# Patient Record
Sex: Male | Born: 1935 | Race: White | Hispanic: No | Marital: Single | Smoking: Never smoker
Health system: Southern US, Community
[De-identification: ages and names within clinical notes are randomized; demographics above are authoritative.]

## PROBLEM LIST (undated history)

## (undated) DIAGNOSIS — I2510 Atherosclerotic heart disease of native coronary artery without angina pectoris: Secondary | ICD-10-CM

## (undated) DIAGNOSIS — I4821 Permanent atrial fibrillation: Secondary | ICD-10-CM

## (undated) DIAGNOSIS — I10 Essential (primary) hypertension: Secondary | ICD-10-CM

## (undated) DIAGNOSIS — I428 Other cardiomyopathies: Secondary | ICD-10-CM

## (undated) DIAGNOSIS — J449 Chronic obstructive pulmonary disease, unspecified: Secondary | ICD-10-CM

## (undated) DIAGNOSIS — E785 Hyperlipidemia, unspecified: Secondary | ICD-10-CM

---

## 2001-01-30 ENCOUNTER — Ambulatory Visit (HOSPITAL_BASED_OUTPATIENT_CLINIC_OR_DEPARTMENT_OTHER): Admission: RE | Admit: 2001-01-30 | Discharge: 2001-01-30 | Payer: Self-pay | Admitting: Orthopedic Surgery

## 2001-02-13 ENCOUNTER — Encounter (INDEPENDENT_AMBULATORY_CARE_PROVIDER_SITE_OTHER): Payer: Self-pay | Admitting: Specialist

## 2001-02-13 ENCOUNTER — Ambulatory Visit (HOSPITAL_BASED_OUTPATIENT_CLINIC_OR_DEPARTMENT_OTHER): Admission: RE | Admit: 2001-02-13 | Discharge: 2001-02-13 | Payer: Self-pay | Admitting: Orthopedic Surgery

## 2002-03-18 ENCOUNTER — Ambulatory Visit (HOSPITAL_COMMUNITY): Admission: RE | Admit: 2002-03-18 | Discharge: 2002-03-18 | Payer: Self-pay | Admitting: Family Medicine

## 2002-03-18 ENCOUNTER — Encounter: Payer: Self-pay | Admitting: Family Medicine

## 2002-03-27 ENCOUNTER — Encounter: Payer: Self-pay | Admitting: Orthopedic Surgery

## 2002-03-27 ENCOUNTER — Ambulatory Visit (HOSPITAL_COMMUNITY): Admission: RE | Admit: 2002-03-27 | Discharge: 2002-03-27 | Payer: Self-pay | Admitting: Orthopedic Surgery

## 2002-09-14 ENCOUNTER — Ambulatory Visit (HOSPITAL_COMMUNITY): Admission: RE | Admit: 2002-09-14 | Discharge: 2002-09-14 | Payer: Self-pay | Admitting: Family Medicine

## 2002-09-14 ENCOUNTER — Encounter: Payer: Self-pay | Admitting: Family Medicine

## 2002-11-15 ENCOUNTER — Observation Stay (HOSPITAL_COMMUNITY): Admission: RE | Admit: 2002-11-15 | Discharge: 2002-11-16 | Payer: Self-pay | Admitting: General Surgery

## 2002-11-15 ENCOUNTER — Encounter (INDEPENDENT_AMBULATORY_CARE_PROVIDER_SITE_OTHER): Payer: Self-pay | Admitting: Specialist

## 2002-11-15 ENCOUNTER — Encounter: Payer: Self-pay | Admitting: General Surgery

## 2005-02-15 ENCOUNTER — Emergency Department (HOSPITAL_COMMUNITY): Admission: EM | Admit: 2005-02-15 | Discharge: 2005-02-15 | Payer: Self-pay | Admitting: Emergency Medicine

## 2009-07-25 ENCOUNTER — Inpatient Hospital Stay (HOSPITAL_COMMUNITY): Admission: EM | Admit: 2009-07-25 | Discharge: 2009-07-29 | Payer: Self-pay

## 2009-07-25 ENCOUNTER — Ambulatory Visit: Payer: Self-pay | Admitting: Diagnostic Radiology

## 2009-07-25 ENCOUNTER — Encounter: Payer: Self-pay | Admitting: Emergency Medicine

## 2009-07-26 ENCOUNTER — Ambulatory Visit: Payer: Self-pay | Admitting: Pulmonary Disease

## 2009-08-02 DIAGNOSIS — I4891 Unspecified atrial fibrillation: Secondary | ICD-10-CM | POA: Insufficient documentation

## 2009-08-02 DIAGNOSIS — J13 Pneumonia due to Streptococcus pneumoniae: Secondary | ICD-10-CM | POA: Insufficient documentation

## 2009-08-02 DIAGNOSIS — K7689 Other specified diseases of liver: Secondary | ICD-10-CM | POA: Insufficient documentation

## 2009-08-02 DIAGNOSIS — Z87898 Personal history of other specified conditions: Secondary | ICD-10-CM | POA: Insufficient documentation

## 2009-08-02 DIAGNOSIS — J8410 Pulmonary fibrosis, unspecified: Secondary | ICD-10-CM

## 2009-08-03 ENCOUNTER — Ambulatory Visit: Payer: Self-pay | Admitting: Diagnostic Radiology

## 2009-08-03 ENCOUNTER — Ambulatory Visit (HOSPITAL_BASED_OUTPATIENT_CLINIC_OR_DEPARTMENT_OTHER): Admission: RE | Admit: 2009-08-03 | Discharge: 2009-08-03 | Payer: Self-pay | Admitting: Pulmonary Disease

## 2009-08-03 ENCOUNTER — Ambulatory Visit: Payer: Self-pay | Admitting: Pulmonary Disease

## 2009-08-07 ENCOUNTER — Telehealth: Payer: Self-pay | Admitting: Pulmonary Disease

## 2009-08-24 ENCOUNTER — Ambulatory Visit: Payer: Self-pay | Admitting: Pulmonary Disease

## 2009-08-24 ENCOUNTER — Telehealth: Payer: Self-pay | Admitting: Pulmonary Disease

## 2009-09-21 ENCOUNTER — Ambulatory Visit: Payer: Self-pay | Admitting: Pulmonary Disease

## 2009-09-21 ENCOUNTER — Ambulatory Visit (HOSPITAL_BASED_OUTPATIENT_CLINIC_OR_DEPARTMENT_OTHER): Admission: RE | Admit: 2009-09-21 | Discharge: 2009-09-21 | Payer: Self-pay | Admitting: Pulmonary Disease

## 2009-09-21 ENCOUNTER — Ambulatory Visit: Payer: Self-pay | Admitting: Diagnostic Radiology

## 2009-10-20 ENCOUNTER — Telehealth: Payer: Self-pay | Admitting: Adult Health

## 2009-12-05 ENCOUNTER — Inpatient Hospital Stay (HOSPITAL_COMMUNITY): Admission: EM | Admit: 2009-12-05 | Discharge: 2009-12-07 | Payer: Self-pay | Admitting: Emergency Medicine

## 2009-12-05 ENCOUNTER — Ambulatory Visit: Payer: Self-pay | Admitting: Cardiology

## 2009-12-06 ENCOUNTER — Ambulatory Visit: Payer: Self-pay | Admitting: Psychiatry

## 2009-12-07 ENCOUNTER — Ambulatory Visit: Payer: Self-pay | Admitting: Psychiatry

## 2009-12-07 ENCOUNTER — Inpatient Hospital Stay (HOSPITAL_COMMUNITY): Admission: AD | Admit: 2009-12-07 | Discharge: 2009-12-11 | Payer: Self-pay | Admitting: Psychiatry

## 2009-12-08 ENCOUNTER — Encounter: Payer: Self-pay | Admitting: Emergency Medicine

## 2010-02-22 ENCOUNTER — Ambulatory Visit (HOSPITAL_COMMUNITY): Admission: RE | Admit: 2010-02-22 | Discharge: 2010-02-22 | Payer: Self-pay | Admitting: Orthopedic Surgery

## 2010-04-05 ENCOUNTER — Ambulatory Visit (HOSPITAL_BASED_OUTPATIENT_CLINIC_OR_DEPARTMENT_OTHER): Admission: RE | Admit: 2010-04-05 | Discharge: 2010-04-06 | Payer: Self-pay | Admitting: Orthopedic Surgery

## 2010-04-19 ENCOUNTER — Encounter: Payer: Self-pay | Admitting: Emergency Medicine

## 2010-04-19 ENCOUNTER — Ambulatory Visit: Payer: Self-pay | Admitting: Diagnostic Radiology

## 2010-04-19 ENCOUNTER — Inpatient Hospital Stay (HOSPITAL_COMMUNITY)
Admission: EM | Admit: 2010-04-19 | Discharge: 2010-04-21 | Payer: Self-pay | Source: Home / Self Care | Admitting: Internal Medicine

## 2010-05-03 ENCOUNTER — Inpatient Hospital Stay (HOSPITAL_COMMUNITY): Admission: EM | Admit: 2010-05-03 | Discharge: 2009-09-30 | Payer: Self-pay | Admitting: Emergency Medicine

## 2010-05-03 ENCOUNTER — Emergency Department (HOSPITAL_BASED_OUTPATIENT_CLINIC_OR_DEPARTMENT_OTHER)
Admission: EM | Admit: 2010-05-03 | Discharge: 2010-05-03 | Payer: Self-pay | Source: Home / Self Care | Admitting: Emergency Medicine

## 2010-06-26 NOTE — Assessment & Plan Note (Signed)
Summary: 1 month follow up in [place], with cxr//jwr   Primary Provider/Referring Provider:  Dr. Kirke Corin [facility] [place] Office) [phone]  CC:  Pt here with follow up with cxr.  History of Present Illness: 73/M, ex smoker for FU of steroid responsive ILD. admitted Feb 2011 for pneumonia, hypoxia & ILD on imaging.  CT  chest with contrast showed diffuse reticular opacities with some mosaic ground-glass opacities and tiny ill-defined centrilobular nodules and  mild mediastinal lymphadenopathy, borderline fatty liver, and 1.7-cm  left adrenal adenoma.  EKG showed atrial fibrillation rate control at 91  per minute. The appearance was not typical of usual interstitial pneumonitis/idiopathic pulmonary fibrosis.  I did not see any  honeycombing.  The ground-glass opacities reflected an ongoing inflammatory process.  ESR 85, ANa neg, RA 23, ANCA ([facility]), ALT/AST 154/77 (denies drinking) Echo nml LV fn, nml RVSP Treated with avelox & empiric steroids  August 03, 2009 2:53 PM  coumadin check with dr Izola Price - INR low On pred 40 mg - high sugars required 1/2 tab glipizide on dc Dramatic improvement with steroids, can ambulate off O2, cough 90% better CXR improved BL infx  August 24, 2009 2:08 PM  Down to 20 mg , wired, talks 'too much' bruises easy, remains on sugar pill, has not checked sugars, has changed PMD, sold off all the dogs   September 21, 2009 2:23 PM  down to 10 mg , CXR nml, pressure of speech - will not stop talking, no dyspnea, cough No hallucinations, denies tremors, oriented x 3   Current Medications (verified): 1)  Aspirin 325 Mg Tabs (Aspirin) .Marland Kitchen.. 1 By Mouth Daily 2)  Coumadin 5 Mg Tabs (Warfarin Sodium) .... As Directed 3)  Cardizem Cd 240 Mg Xr24h-Cap (Diltiazem Hcl Coated Beads) .Marland Kitchen.. 1 By Mouth Daily 4)  Flomax 0.4 Mg Caps (Tamsulosin Hcl) .Marland Kitchen.. 1 By Mouth Daily 5)  Lipitor 80 Mg Tabs (Atorvastatin Calcium) .... Take 1/2 Tablet By Mouth  Once A Day 6)  Proscar 5 Mg Tabs (Finasteride) .Marland Kitchen.. 1 By Mouth Daily 7)  Multivitamins   Tabs (Multiple Vitamin) .... Take 1 Tablet By Mouth Once A Day 8)  Cinnamon 500 Mg Caps (Cinnamon) .... Take 2 Capsule By Mouth Once A Day 9)  B-12 .... Take 1 Tablet By Mouth Once A Day 10)  Prednisone 5 Mg Tabs (Prednisone) .... Take 3 Tabs Once Daily X 2 Weeks Then 2 Tabs Once Daily With Food 11)  Zolpidem Tartrate 10 Mg Tabs (Zolpidem Tartrate) .... Take 1 Tab By Mouth At Bedtime As Needed  Allergies (verified): 1)  ! Lisinopril 2)  ! * Antihistamines  Past History:  Past Medical History: Last updated: 08/02/2009 Current Problems:  PNEUMONIA, COMMUNITY ACQUIRED, PNEUMOCOCCAL (ICD-481) INTERSTITIAL LUNG DISEASE (ICD-515) FATTY LIVER DISEASE (ICD-571.8) BENIGN PROSTATIC HYPERTROPHY, HX OF (ICD-V13.8) Hx of ATRIAL FIBRILLATION (ICD-427.31)  Social History: Last updated: 08/03/2009 Lives by himself Has 8 Beagles Estranged daughter Patient states former smoker.   Review of Systems  The patient denies anorexia, fever, weight loss, weight gain, vision loss, decreased hearing, hoarseness, chest pain, syncope, dyspnea on exertion, peripheral edema, prolonged cough, headaches, hemoptysis, abdominal pain, melena, hematochezia, severe indigestion/heartburn, hematuria, muscle weakness, suspicious skin lesions, difficulty walking, depression, unusual weight change, and abnormal bleeding.    Vital Signs:  Patient profile:   75 year old male Height:      75 inches Weight:      178 pounds O2 Sat:  97 % on Room air Temp:     99 degrees F oral Pulse rate:   136 / minute BP sitting:   118 / 84  (left arm) Cuff size:   regular  Vitals Entered By: Zackery Barefoot CMA (September 21, 2009 2:07 PM)  O2 Flow:  Room air CC: Pt here with follow up with cxr Comments Medications reviewed with patient Verified contact number and pharmacy with patient Zackery Barefoot CMA  September 21, 2009 2:07 PM     Physical Exam  Additional Exam:  Gen. Pleasant, well-nourished, in no distress, normal affect ENT - no lesions, no post nasal drip Neck: No JVD, no thyromegaly, no carotid bruits Lungs: no use of accessory muscles, no dullness to percussion, fine biabasal rales, no rhonchi  Cardiovascular: Rhythm regular, heart sounds  normal, no murmurs or gallops, no peripheral edema Musculoskeletal: No deformities, no cyanosis or clubbing     CXR  Procedure date:  09/21/2009  Findings:      Comparison: 08/03/2009   Findings: Mild pulmonary interstitial prominence is again seen bilaterally and has not significantly changed.  This is suspicious for underlying chronic lung disease.  No evidence of pulmonary consolidation or pleural effusion.  Heart size and mediastinal contours are within normal limits.   IMPRESSION: Stable exam.  Probable mild chronic interstitial lung disease.  No acute findings.  Impression & Recommendations:  Problem # 1:  INTERSTITIAL LUNG DISEASE (ICD-515) -steroid responsive , hence favor hypersensitivity pneumonitis rather than UIP. Will taper prednisone rapidly to off given side effects on speech & behaviour. He does not appear to be overtly manic or psychotic .  Medications Added to Medication List This Visit: 1)  Lipitor 80 Mg Tabs (Atorvastatin calcium) .... Take 1/2 tablet by mouth once a day 2)  Cinnamon 500 Mg Caps (Cinnamon) .... Take 2 capsule by mouth once a day 3)  Zolpidem Tartrate 10 Mg Tabs (Zolpidem tartrate) .... Take 1 tab by mouth at bedtime as needed  Other Orders: Est. Patient Level III (16109)  Patient Instructions: 1)  Copy sent to: dR mOORE 2)  Please schedule a follow-up appointment in 4 weeks with tp 3)  Decrease prednisone 5 mg once daily x 1 week , then 5 mg every other day till you finish

## 2010-06-26 NOTE — Miscellaneous (Signed)
Summary: Orders Update  Clinical Lists Changes  Orders: Added new Test order of T-2 View CXR (71020TC) - Signed 

## 2010-06-26 NOTE — Progress Notes (Signed)
  Phone Note Outgoing Call   Summary of Call: Called [facility] for ANCA results. Pending fax of results. Zackery Barefoot CMA  August 07, 2009 3:36 PM

## 2010-06-26 NOTE — Consult Note (Signed)
Summary: [facility]  [facility]   Imported By: Marylou Mccoy 12/19/2009 09:30:54  _____________________________________________________________________  External Attachment:    Type:   Image     Comment:   External Document

## 2010-06-26 NOTE — Assessment & Plan Note (Signed)
Summary: 2 week follow up in [place]-jwr   Visit Type:  Follow-up Primary Provider/Referring Provider:  Dr. Kirke Corin [facility], [place] Office) [phone]  CC:  Pt here for follow up. Pt request to d/c Prednisone due to side effects.  Pt states ran out of Prednisone yesterday. Pt states breathing is better w/o S.O.B.  History of Present Illness: 73/M, ex smoker for FU of steroid responsive ILD. admitted Feb 2011 for pneumonia, hypoxia & ILD on imaging.  CT  chest with contrast showed diffuse reticular opacities with some mosaic ground-glass opacities and tiny ill-defined centrilobular nodules and  mild mediastinal lymphadenopathy, borderline fatty liver, and 1.7-cm  left adrenal adenoma.  EKG showed atrial fibrillation rate control at 91  per minute. The appearance was not typical of usual interstitial pneumonitis/idiopathic pulmonary fibrosis.  I did not see any  honeycombing.  The ground-glass opacities reflected an ongoing inflammatory process.  ESR 85, ANa neg, RA 23, ANCA ([facility]), ALT/AST 154/77 (denies drinking) Echo nml LV fn, nml RVSP Treated with avelox & empiric steroids  August 03, 2009 2:53 PM  coumadin check with dr Izola Price - INR low On pred 40 mg - high sugars required 1/2 tab glipizide on dc Dramatic improvement with steroids, can ambulate off O2, cough 90% better CXR improved BL infx  August 24, 2009 2:08 PM  Down to 20 mg , wired, talks 'too much' bruises easy, remains on sugar pill, has not checked sugars, has changed PMD, sold off all the dogs    Allergies (verified): 1)  ! Lisinopril 2)  ! * Antihistamines  Past History:  Past Medical History: Last updated: 08/02/2009 Current Problems:  PNEUMONIA, COMMUNITY ACQUIRED, PNEUMOCOCCAL (ICD-481) INTERSTITIAL LUNG DISEASE (ICD-515) FATTY LIVER DISEASE (ICD-571.8) BENIGN PROSTATIC HYPERTROPHY, HX OF (ICD-V13.8) Hx of ATRIAL FIBRILLATION (ICD-427.31)  Social History: Last updated:  08/03/2009 Lives by himself Has 8 Beagles Estranged daughter Patient states former smoker.   Risk Factors: Smoking Status: quit (08/03/2009) Packs/Day: 0.25 (08/03/2009)  Review of Systems  The patient denies anorexia, fever, weight loss, weight gain, vision loss, decreased hearing, hoarseness, chest pain, syncope, dyspnea on exertion, peripheral edema, prolonged cough, headaches, hemoptysis, abdominal pain, melena, hematochezia, severe indigestion/heartburn, hematuria, muscle weakness, suspicious skin lesions, difficulty walking, depression, unusual weight change, and abnormal bleeding.    Vital Signs:  Patient profile:   75 year old male Height:      75 inches Weight:      180 pounds O2 Sat:      96 % on Room air Temp:     98.0 degrees F oral Pulse rate:   75 / minute BP sitting:   90 / 60  (left arm) Cuff size:   regular  Vitals Entered By: Zackery Barefoot CMA (August 24, 2009 1:48 PM)  O2 Flow:  Room air  Serial Vital Signs/Assessments:  Comments: Ambulatory Pulse Oximetry  Resting; HR__65___    02 Sat_____  Lap1 (185 feet)   HR__125___   02 Sat__99%RA___ Lap2 (185 feet)   HR__120___   02 Sat__98%RA___    Lap3 (185 feet)   HR__147___   02 Sat__99%RA___  _x__Test Completed without Difficulty ___Test Stopped due to:  Zackery Barefoot CMA  August 24, 2009 4:06 PM    By: Zackery Barefoot CMA   CC: Pt here for follow up. Pt request to d/c Prednisone due to side effects.  Pt states ran out of Prednisone yesterday. Pt states breathing is better w/o S.O.B Comments Medications reviewed with patient Verified contact  number and pharmacy with patient Zackery Barefoot CMA  August 24, 2009 1:55 PM    Physical Exam  Additional Exam:  Gen. Pleasant, well-nourished, in no distress, normal affect ENT - no lesions, no post nasal drip Neck: No JVD, no thyromegaly, no carotid bruits Lungs: no use of accessory muscles, no dullness to percussion, fine biabasal rales, no rhonchi   Cardiovascular: Rhythm regular, heart sounds  normal, no murmurs or gallops, no peripheral edema Musculoskeletal: No deformities, no cyanosis or clubbing     Medications Added to Medication List This Visit: 1)  Atenolol 50 Mg Tabs (Atenolol) .... Take 1/2 tablet by mouth two times a day 2)  Prednisone 5 Mg Tabs (Prednisone) .... Take 3 tabs once daily x 2 weeks then 2 tabs once daily with food  Other Orders: Est. Patient Level III (73419) Prescription Created Electronically [phone])  Patient Instructions: 1)  Copy sent to: Dr Christell Constant 2)  Please schedule a follow-up appointment in 1 month. 3)  Take 15 mg prednisone (3 x 5 mg tabs ) once daily  4)  On Apil 15 th, cut down to 2 tabs (10 mg ) once daily  5)  Ambulate -O2 satn Prescriptions: PREDNISONE 5 MG TABS (PREDNISONE) Take 3 tabs once daily x 2 weeks then 2 tabs once daily with food  #75 x 1   Entered and Authorized by:   Comer Locket Vassie Loll MD   Signed by:   Comer Locket Vassie Loll MD on 08/24/2009   Method used:   Print then Give to Patient   RxID:   [id number]    Immunization History:  Influenza Immunization History:    Influenza:  historical (02/27/2009)  Pneumovax Immunization History:    Pneumovax:  historical (06/01/2007)  Zostavax History:    Zostavax # 1:  zostavax (05/29/2009)

## 2010-06-26 NOTE — Progress Notes (Signed)
Summary: nos appt  Phone Note Call from Patient   Caller: [email or web address]  Call For: [name] Summary of Call: ATC pt to rsc nos from 5/26, pt states he is doing great and no longer needs to be seen will call if anything changes. Initial call taken by: Darletta Moll,  Oct 20, 2009 9:06 AM

## 2010-06-26 NOTE — Assessment & Plan Note (Signed)
Summary: HFU/ PNA PER NURSE-MARY-PER RA //kp   Visit Type:  Hospital Follow-up Primary Provider/Referring Provider:  Dr. Izola Price  CC:  Pt here for hospital follow up. Pt here for first time in office. Pt states is doing better everyday since leaving hospital. Pt states finished antibiotic and rescue inhaler.  History of Present Illness: 73/M, ex smoker admitted Feb 2011 for pneumonia, hypoxia & ILD on imaging.  CT  chest with contrast showed diffuse reticular opacities with some mosaic ground-glass opacities and tiny ill-defined centrilobular nodules and  mild mediastinal lymphadenopathy, borderline fatty liver, and 1.7-cm  left adrenal adenoma.  EKG showed atrial fibrillation rate control at 91  per minute. The appearance was not typical of usual interstitial pneumonitis/idiopathic pulmonary fibrosis.  I did not see any  honeycombing.  The ground-glass opacities reflected an ongoing inflammatory process.  ESR 85, ANa neg, RA 23, ANCA ([facility]), ALT/AST 154/77 (denies drinking) Echo nml LV fn, nml RVSP Treated with avelox & empiric steroids  August 03, 2009 2:53 PM  coumadin check with dr Izola Price - INR low On pred 40 mg - high sugars required 1/2 tab glipizide on dc Dramatic improvement with steroids, can ambulate off O2, cough 90% better CXR improved BL infx   Preventive Screening-Counseling & Management  Alcohol-Tobacco     Smoking Status: quit     Packs/Day: 0.25     Year Started: 1956     Year Quit: 1970  Current Medications (verified): 1)  Prednisone 10 Mg Tabs (Prednisone) .... Taper As Directed 2)  Aspirin 325 Mg Tabs (Aspirin) .Marland Kitchen.. 1 By Mouth Daily 3)  Atenolol 100 Mg Tabs (Atenolol) .... 1/2 By Mouth Daily 4)  Coumadin 5 Mg Tabs (Warfarin Sodium) .... As Directed 5)  Cardizem Cd 240 Mg Xr24h-Cap (Diltiazem Hcl Coated Beads) .Marland Kitchen.. 1 By Mouth Daily 6)  Flomax 0.4 Mg Caps (Tamsulosin Hcl) .Marland Kitchen.. 1 By Mouth Daily 7)  Lipitor 40 Mg Tabs (Atorvastatin Calcium) .Marland Kitchen.. 1 By Mouth  Daily 8)  Proscar 5 Mg Tabs (Finasteride) .Marland Kitchen.. 1 By Mouth Daily 9)  Multivitamins   Tabs (Multiple Vitamin) .... Take 1 Tablet By Mouth Once A Day 10)  Cinnamon 500 Mg Caps (Cinnamon) .... Take 1 Capsule By Mouth Once A Day 11)  B-12 .... Take 1 Tablet By Mouth Once A Day  Allergies (verified): 1)  ! Lisinopril 2)  ! * Antihistamines  Past History:  Past Medical History: Last updated: 08/02/2009 Current Problems:  PNEUMONIA, COMMUNITY ACQUIRED, PNEUMOCOCCAL (ICD-481) INTERSTITIAL LUNG DISEASE (ICD-515) FATTY LIVER DISEASE (ICD-571.8) BENIGN PROSTATIC HYPERTROPHY, HX OF (ICD-V13.8) Hx of ATRIAL FIBRILLATION (ICD-427.31)  Past Surgical History: Cholecystectomy Right knee repair Left shoulder rotator cuff repair Bilateral hand surgery Tonsillectomy  Family History: Family History C V A / Stroke-mother, sister, brother Pacemaker-father  Social History: Reviewed history from 08/02/2009 and no changes required. Lives by himself Has 8 Beagles Estranged daughter Patient states former smoker.  Packs/Day:  0.25  Review of Systems       The patient complains of non-productive cough, irregular heartbeats, and weight change.  The patient denies shortness of breath with activity, shortness of breath at rest, productive cough, coughing up blood, chest pain, acid heartburn, indigestion, loss of appetite, abdominal pain, difficulty swallowing, sore throat, tooth/dental problems, headaches, nasal congestion/difficulty breathing through nose, sneezing, itching, ear ache, anxiety, depression, hand/feet swelling, joint stiffness or pain, rash, change in color of mucus, and fever.    Vital Signs:  Patient profile:   75 year old male Height:  75 inches Weight:      182 pounds BMI:     22.83 O2 Sat:      94 % on Room air Temp:     97.6 degrees F oral Pulse rate:   85 / minute BP sitting:   124 / 78  (left arm) Cuff size:   regular  Vitals Entered By: Zackery Barefoot CMA (August 03, 2009 2:31 PM)  O2 Flow:  Room air CC: Pt here for hospital follow up. Pt here for first time in office. Pt states is doing better everyday since leaving hospital. Pt states finished antibiotic and rescue inhaler Comments Medications reviewed with patient Verified contact number and pharmacy with patient Zackery Barefoot CMA  August 03, 2009 2:32 PM    Physical Exam  Additional Exam:  Gen. Pleasant, well-nourished, in no distress, normal affect ENT - no lesions, no post nasal drip Neck: No JVD, no thyromegaly, no carotid bruits Lungs: no use of accessory muscles, no dullness to percussion, fine biabasal rales, no rhonchi  Cardiovascular: Rhythm regular, heart sounds  normal, no murmurs or gallops, no peripheral edema Abdomen: soft and non-tender, no hepatosplenomegaly, BS normal. Musculoskeletal: No deformities, no cyanosis or clubbing Neuro:  alert, non focal     CXR  Procedure date:  08/03/2009  Findings:      Comparison: 07/27/2009   Findings: Heart is mildly enlarged. Mediastinal contours and pulmonary vascularity normal. Bilateral diffuse interstitial infiltrates have improved since previous exam. No segmental consolidation, pleural effusion or pneumothorax. Scattered end plate spur formation thoracic spine. Calcification at the right coracoclavicular ligaments.   IMPRESSION: Improving bilateral interstitial infiltrates.  Impression & Recommendations:  Problem # 1:  PNEUMONIA, COMMUNITY ACQUIRED, PNEUMOCOCCAL (ICD-481) No organism isolated, but responded to avelox Orders: Est. Patient Level IV (99214) T-2 View CXR (71020TC)  Problem # 2:  INTERSTITIAL LUNG DISEASE (ICD-515) Feel he has steroid responsive ILD - given dramatic improvement , can defer biopsy for now. Dd includes hypersensitivity pneumonitis. Will taper prednisone by 10 mg q week to 20 mg then slowly to off & see if disease recurs. Expect suagrs to improve as prednisone decreased & hopefully he  can come off glipizide  Problem # 3:  Hx of ATRIAL FIBRILLATION (ICD-427.31) ok to be back on coumadin His BP was low today & he will hold atenolol until improved, ct cardizem His updated medication list for this problem includes:    Aspirin 325 Mg Tabs (Aspirin) .Marland Kitchen... 1 by mouth daily    Atenolol 100 Mg Tabs (Atenolol) .Marland Kitchen... 1/2 by mouth daily    Coumadin 5 Mg Tabs (Warfarin sodium) .Marland Kitchen... As directed  Medications Added to Medication List This Visit: 1)  Atenolol 100 Mg Tabs (Atenolol) .... 1/2 by mouth daily 2)  Multivitamins Tabs (Multiple vitamin) .... Take 1 tablet by mouth once a day 3)  Cinnamon 500 Mg Caps (Cinnamon) .... Take 1 capsule by mouth once a day 4)  B-12  .... Take 1 tablet by mouth once a day  Patient Instructions: 1)  Copy sent to:Dr Izola Price 2)  Please schedule a follow-up appointment in 2 weeks. 3)  A chest x-ray has been recommended.  Your imaging study may require preauthorization.  4)  Please ask Dr Izola Price to check your coumadin & sugars 5)  Cut down on prednisone to  6)  30 mg this saturday 7)  20 mg next saturday 8)  STAY on 20 mg until you see me again

## 2010-06-26 NOTE — Progress Notes (Signed)
Summary: rx question  Phone Note Call from Patient Call back at Home Phone [phone]   Caller: Patient Call For: alva Summary of Call: pt was just seen wants to talk to the nurse about rx Initial call taken by: Lacinda Axon,  August 24, 2009 4:10 PM  Follow-up for Phone Call        Pt was concerned RA did not reduce his Prednisone. Pt was previously on Prednisone 10mg  2 daily. Pt is now on Prednisone 5mg . Pt verbalized understanding.  Follow-up by: Zackery Barefoot CMA,  August 24, 2009 4:47 PM

## 2010-08-07 LAB — CBC
HCT: 37.5 % — ABNORMAL LOW (ref 39.0–52.0)
Hemoglobin: 12.7 g/dL — ABNORMAL LOW (ref 13.0–17.0)
Hemoglobin: 14.8 g/dL (ref 13.0–17.0)
MCH: 32.4 pg (ref 26.0–34.0)
MCHC: 33.5 g/dL (ref 30.0–36.0)
MCHC: 34.6 g/dL (ref 30.0–36.0)
MCV: 93.9 fL (ref 78.0–100.0)
Platelets: 281 10*3/uL (ref 150–400)
RBC: 4.24 MIL/uL (ref 4.22–5.81)
RBC: 4.68 MIL/uL (ref 4.22–5.81)
RDW: 13.8 % (ref 11.5–15.5)
RDW: 14.3 % (ref 11.5–15.5)
WBC: 14.6 10*3/uL — ABNORMAL HIGH (ref 4.0–10.5)
WBC: 13.6 10*3/uL — ABNORMAL HIGH (ref 4.0–10.5)

## 2010-08-07 LAB — DIGOXIN LEVEL
Digoxin Level: 0.6 ng/mL — ABNORMAL LOW (ref 0.8–2.0)
Digoxin Level: 0.4 ng/mL — ABNORMAL LOW (ref 0.8–2.0)

## 2010-08-07 LAB — URINE MICROSCOPIC-ADD ON

## 2010-08-07 LAB — POCT CARDIAC MARKERS
CKMB, poc: 1.1 ng/mL (ref 1.0–8.0)
Myoglobin, poc: 79.4 ng/mL (ref 12–200)

## 2010-08-07 LAB — URINE CULTURE
Colony Count: NO GROWTH
Culture: NO GROWTH

## 2010-08-07 LAB — BASIC METABOLIC PANEL
BUN: 12 mg/dL (ref 6–23)
BUN: 11 mg/dL (ref 6–23)
BUN: 9 mg/dL (ref 6–23)
CO2: 25 mEq/L (ref 19–32)
CO2: 27 mEq/L (ref 19–32)
Calcium: 9.3 mg/dL (ref 8.4–10.5)
Calcium: 8.6 mg/dL (ref 8.4–10.5)
Calcium: 9.5 mg/dL (ref 8.4–10.5)
Chloride: 97 mEq/L (ref 96–112)
Chloride: 104 mEq/L (ref 96–112)
Chloride: 102 mEq/L (ref 96–112)
Creatinine, Ser: 1 mg/dL (ref 0.4–1.5)
GFR calc Af Amer: >60 mL/min (ref >60)
GFR calc Af Amer: >60 mL/min (ref >60)
GFR calc Af Amer: >60 mL/min (ref >60)
GFR calc non Af Amer: >60 mL/min (ref >60)
GFR calc non Af Amer: >60 mL/min (ref >60)
GFR calc non Af Amer: 60 mL/min — ABNORMAL LOW (ref >60)
Glucose, Bld: 158 mg/dL — ABNORMAL HIGH (ref 70–99)
Glucose, Bld: 111 mg/dL — ABNORMAL HIGH (ref 70–99)
Glucose, Bld: 110 mg/dL — ABNORMAL HIGH (ref 70–99)
Glucose, Bld: 93 mg/dL (ref 70–99)
Potassium: 4.3 mEq/L (ref 3.5–5.1)
Sodium: 142 mEq/L (ref 135–145)
Sodium: 142 mEq/L (ref 135–145)

## 2010-08-07 LAB — URINALYSIS, ROUTINE W REFLEX MICROSCOPIC
Bilirubin Urine: NEGATIVE
Bilirubin Urine: NEGATIVE
Glucose, UA: NEGATIVE mg/dL
Glucose, UA: NEGATIVE mg/dL
Ketones, ur: NEGATIVE mg/dL
Ketones, ur: 15 mg/dL — AB
Nitrite: NEGATIVE
Nitrite: NEGATIVE
Protein, ur: NEGATIVE mg/dL
Protein, ur: NEGATIVE mg/dL
Specific Gravity, Urine: 1.022 (ref 1.005–1.030)
pH: 5.5 (ref 5.0–8.0)

## 2010-08-07 LAB — DIFFERENTIAL
Basophils Absolute: 0 10*3/uL (ref 0.0–0.1)
Basophils Relative: 0 % (ref 0–1)
Eosinophils Relative: 1 % (ref 0–5)
Lymphs Abs: 0.7 10*3/uL (ref 0.7–4.0)
Lymphs Abs: 1.3 10*3/uL (ref 0.7–4.0)
Monocytes Relative: 16 % — ABNORMAL HIGH (ref 3–12)
Monocytes Relative: 12 % (ref 3–12)
Neutro Abs: 11 10*3/uL — ABNORMAL HIGH (ref 1.7–7.7)
Neutro Abs: 9.9 10*3/uL — ABNORMAL HIGH (ref 1.7–7.7)
Neutrophils Relative %: 79 % — ABNORMAL HIGH (ref 43–77)
Neutrophils Relative %: 73 % (ref 43–77)

## 2010-08-07 LAB — APTT: aPTT: 47 seconds — ABNORMAL HIGH (ref 24–37)

## 2010-08-07 LAB — POCT TOXICOLOGY PANEL: Opiates: POSITIVE

## 2010-08-11 LAB — URINALYSIS, ROUTINE W REFLEX MICROSCOPIC
Hgb urine dipstick: NEGATIVE
Nitrite: NEGATIVE
Specific Gravity, Urine: 1.015 (ref 1.005–1.030)
Urobilinogen, UA: 1 mg/dL (ref 0.0–1.0)

## 2010-08-11 LAB — COMPREHENSIVE METABOLIC PANEL
BUN: 13 mg/dL (ref 6–23)
CO2: 26 mEq/L (ref 19–32)
Calcium: 9.3 mg/dL (ref 8.4–10.5)
Creatinine, Ser: 1.01 mg/dL (ref 0.4–1.5)
GFR calc non Af Amer: >60 mL/min (ref >60)
Glucose, Bld: 125 mg/dL — ABNORMAL HIGH (ref 70–99)
Total Protein: 7.2 g/dL (ref 6.0–8.3)

## 2010-08-11 LAB — POCT CARDIAC MARKERS
Myoglobin, poc: 77.1 ng/mL (ref 12–200)
Troponin i, poc: <0.05 ng/mL (ref 0.00–0.09)

## 2010-08-11 LAB — DIFFERENTIAL
Eosinophils Absolute: 0.1 10*3/uL (ref 0.0–0.7)
Lymphs Abs: 1 10*3/uL (ref 0.7–4.0)
Monocytes Relative: 8 % (ref 3–12)
Neutro Abs: 5.7 10*3/uL (ref 1.7–7.7)
Neutrophils Relative %: 77 % (ref 43–77)

## 2010-08-11 LAB — CBC
HCT: 45.9 % (ref 39.0–52.0)
Hemoglobin: 15.5 g/dL (ref 13.0–17.0)
MCH: 31.4 pg (ref 26.0–34.0)
MCHC: 33.7 g/dL (ref 30.0–36.0)
MCV: 93.3 fL (ref 78.0–100.0)
RDW: 16.4 % — ABNORMAL HIGH (ref 11.5–15.5)

## 2010-08-11 LAB — URINE CULTURE

## 2010-08-11 LAB — DIGOXIN LEVEL: Digoxin Level: 0.6 ng/mL — ABNORMAL LOW (ref 0.8–2.0)

## 2010-08-11 LAB — PROTIME-INR: Prothrombin Time: 14.9 seconds (ref 11.6–15.2)

## 2010-08-11 LAB — GLUCOSE, CAPILLARY
Glucose-Capillary: 101 mg/dL — ABNORMAL HIGH (ref 70–99)
Glucose-Capillary: 112 mg/dL — ABNORMAL HIGH (ref 70–99)
Glucose-Capillary: 125 mg/dL — ABNORMAL HIGH (ref 70–99)
Glucose-Capillary: 181 mg/dL — ABNORMAL HIGH (ref 70–99)

## 2010-08-12 LAB — CARDIAC PANEL(CRET KIN+CKTOT+MB+TROPI)
Relative Index: INVALID (ref 0.0–2.5)
Relative Index: INVALID (ref 0.0–2.5)
Total CK: 33 U/L (ref 7–232)
Total CK: 40 U/L (ref 7–232)
Troponin I: 0.01 ng/mL (ref 0.00–0.06)

## 2010-08-12 LAB — GLUCOSE, CAPILLARY
Glucose-Capillary: 114 mg/dL — ABNORMAL HIGH (ref 70–99)
Glucose-Capillary: 136 mg/dL — ABNORMAL HIGH (ref 70–99)
Glucose-Capillary: 137 mg/dL — ABNORMAL HIGH (ref 70–99)
Glucose-Capillary: 145 mg/dL — ABNORMAL HIGH (ref 70–99)

## 2010-08-12 LAB — BASIC METABOLIC PANEL
BUN: 13 mg/dL (ref 6–23)
CO2: 25 mEq/L (ref 19–32)
Calcium: 8.5 mg/dL (ref 8.4–10.5)
Calcium: 8.9 mg/dL (ref 8.4–10.5)
Chloride: 106 mEq/L (ref 96–112)
Creatinine, Ser: 0.97 mg/dL (ref 0.4–1.5)
GFR calc Af Amer: >60 mL/min (ref >60)
GFR calc Af Amer: >60 mL/min (ref >60)
GFR calc non Af Amer: >60 mL/min (ref >60)
Sodium: 139 mEq/L (ref 135–145)

## 2010-08-12 LAB — DIFFERENTIAL
Basophils Relative: 1 % (ref 0–1)
Eosinophils Absolute: 0 10*3/uL (ref 0.0–0.7)
Monocytes Absolute: 0.7 10*3/uL (ref 0.1–1.0)
Monocytes Relative: 9 % (ref 3–12)

## 2010-08-12 LAB — CBC
HCT: 41.2 % (ref 39.0–52.0)
Hemoglobin: 13.9 g/dL (ref 13.0–17.0)
MCH: 31.5 pg (ref 26.0–34.0)
MCHC: 33.7 g/dL (ref 30.0–36.0)

## 2010-08-12 LAB — URINALYSIS, ROUTINE W REFLEX MICROSCOPIC
Hgb urine dipstick: NEGATIVE
Nitrite: NEGATIVE
Protein, ur: NEGATIVE mg/dL
Specific Gravity, Urine: 1.017 (ref 1.005–1.030)
Urobilinogen, UA: 0.2 mg/dL (ref 0.0–1.0)

## 2010-08-12 LAB — RAPID URINE DRUG SCREEN, HOSP PERFORMED
Amphetamines: NOT DETECTED
Barbiturates: NOT DETECTED
Opiates: NOT DETECTED
Tetrahydrocannabinol: NOT DETECTED

## 2010-08-12 LAB — POCT CARDIAC MARKERS: Troponin i, poc: <0.05 ng/mL (ref 0.00–0.09)

## 2010-08-12 LAB — ETHANOL: Alcohol, Ethyl (B): <5 mg/dL (ref 0–10)

## 2010-08-14 LAB — CBC
Hemoglobin: 14.4 g/dL (ref 13.0–17.0)
MCHC: 32.3 g/dL (ref 30.0–36.0)
MCHC: 33 g/dL (ref 30.0–36.0)
MCV: 95.3 fL (ref 78.0–100.0)
Platelets: 213 10*3/uL (ref 150–400)
Platelets: 219 10*3/uL (ref 150–400)
Platelets: 232 10*3/uL (ref 150–400)
RBC: 4.43 MIL/uL (ref 4.22–5.81)
RDW: 18.9 % — ABNORMAL HIGH (ref 11.5–15.5)
RDW: 19.2 % — ABNORMAL HIGH (ref 11.5–15.5)
RDW: 19.4 % — ABNORMAL HIGH (ref 11.5–15.5)
WBC: 9.6 10*3/uL (ref 4.0–10.5)

## 2010-08-14 LAB — COMPREHENSIVE METABOLIC PANEL
ALT: 82 U/L — ABNORMAL HIGH (ref 0–53)
AST: 34 U/L (ref 0–37)
AST: 47 U/L — ABNORMAL HIGH (ref 0–37)
Albumin: 3.3 g/dL — ABNORMAL LOW (ref 3.5–5.2)
Albumin: 3.6 g/dL (ref 3.5–5.2)
BUN: 18 mg/dL (ref 6–23)
CO2: 26 mEq/L (ref 19–32)
Calcium: 8.5 mg/dL (ref 8.4–10.5)
Chloride: 107 mEq/L (ref 96–112)
Creatinine, Ser: 0.99 mg/dL (ref 0.4–1.5)
GFR calc Af Amer: >60 mL/min (ref >60)
GFR calc Af Amer: >60 mL/min (ref >60)
Sodium: 137 mEq/L (ref 135–145)
Total Bilirubin: 1.7 mg/dL — ABNORMAL HIGH (ref 0.3–1.2)
Total Protein: 5.8 g/dL — ABNORMAL LOW (ref 6.0–8.3)
Total Protein: 6.2 g/dL (ref 6.0–8.3)

## 2010-08-14 LAB — GLUCOSE, CAPILLARY
Glucose-Capillary: 100 mg/dL — ABNORMAL HIGH (ref 70–99)
Glucose-Capillary: 108 mg/dL — ABNORMAL HIGH (ref 70–99)
Glucose-Capillary: 111 mg/dL — ABNORMAL HIGH (ref 70–99)
Glucose-Capillary: 128 mg/dL — ABNORMAL HIGH (ref 70–99)
Glucose-Capillary: 131 mg/dL — ABNORMAL HIGH (ref 70–99)
Glucose-Capillary: 135 mg/dL — ABNORMAL HIGH (ref 70–99)
Glucose-Capillary: 140 mg/dL — ABNORMAL HIGH (ref 70–99)
Glucose-Capillary: 150 mg/dL — ABNORMAL HIGH (ref 70–99)
Glucose-Capillary: 151 mg/dL — ABNORMAL HIGH (ref 70–99)
Glucose-Capillary: 224 mg/dL — ABNORMAL HIGH (ref 70–99)
Glucose-Capillary: 93 mg/dL (ref 70–99)
Glucose-Capillary: 94 mg/dL (ref 70–99)
Glucose-Capillary: 98 mg/dL (ref 70–99)

## 2010-08-14 LAB — PROTIME-INR
INR: 1.5 — ABNORMAL HIGH (ref 0.00–1.49)
INR: 1.59 — ABNORMAL HIGH (ref 0.00–1.49)
INR: 1.73 — ABNORMAL HIGH (ref 0.00–1.49)

## 2010-08-14 LAB — POCT CARDIAC MARKERS
Myoglobin, poc: 101 ng/mL (ref 12–200)
Troponin i, poc: <0.05 ng/mL (ref 0.00–0.09)

## 2010-08-14 LAB — TSH: TSH: 2.257 u[IU]/mL (ref 0.350–4.500)

## 2010-08-14 LAB — LIPID PANEL
Cholesterol: 121 mg/dL (ref 0–200)
HDL: 43 mg/dL (ref >39)
LDL Cholesterol: 60 mg/dL (ref 0–99)
Triglycerides: 90 mg/dL (ref <150)

## 2010-08-14 LAB — BASIC METABOLIC PANEL
BUN: 14 mg/dL (ref 6–23)
CO2: 26 mEq/L (ref 19–32)
Calcium: 8.9 mg/dL (ref 8.4–10.5)
GFR calc non Af Amer: >60 mL/min (ref >60)
Glucose, Bld: 107 mg/dL — ABNORMAL HIGH (ref 70–99)
Sodium: 139 mEq/L (ref 135–145)

## 2010-08-14 LAB — DIFFERENTIAL
Eosinophils Absolute: 0 10*3/uL (ref 0.0–0.7)
Eosinophils Relative: 0 % (ref 0–5)
Lymphs Abs: 1.2 10*3/uL (ref 0.7–4.0)
Monocytes Absolute: 0.5 10*3/uL (ref 0.1–1.0)
Monocytes Relative: 5 % (ref 3–12)

## 2010-08-14 LAB — HEMOGLOBIN A1C: Mean Plasma Glucose: 131 mg/dL — ABNORMAL HIGH (ref <117)

## 2010-08-14 LAB — URINALYSIS, ROUTINE W REFLEX MICROSCOPIC
Glucose, UA: NEGATIVE mg/dL
Nitrite: NEGATIVE
pH: 5 (ref 5.0–8.0)

## 2010-08-14 LAB — APTT: aPTT: 29 seconds (ref 24–37)

## 2010-08-14 LAB — RAPID URINE DRUG SCREEN, HOSP PERFORMED
Benzodiazepines: NOT DETECTED
Tetrahydrocannabinol: NOT DETECTED

## 2010-08-19 LAB — COMPREHENSIVE METABOLIC PANEL
ALT: 154 U/L — ABNORMAL HIGH (ref 0–53)
ALT: 170 U/L — ABNORMAL HIGH (ref 0–53)
AST: 77 U/L — ABNORMAL HIGH (ref 0–37)
AST: 77 U/L — ABNORMAL HIGH (ref 0–37)
Albumin: 2.3 g/dL — ABNORMAL LOW (ref 3.5–5.2)
Alkaline Phosphatase: 100 U/L (ref 39–117)
Alkaline Phosphatase: 79 U/L (ref 39–117)
BUN: 12 mg/dL (ref 6–23)
Calcium: 8.8 mg/dL (ref 8.4–10.5)
GFR calc Af Amer: >60 mL/min (ref >60)
GFR calc Af Amer: >60 mL/min (ref >60)
Potassium: 5.1 mEq/L (ref 3.5–5.1)
Potassium: 5.3 mEq/L — ABNORMAL HIGH (ref 3.5–5.1)
Sodium: 134 mEq/L — ABNORMAL LOW (ref 135–145)
Sodium: 137 mEq/L (ref 135–145)
Total Protein: 6.6 g/dL (ref 6.0–8.3)
Total Protein: 6.9 g/dL (ref 6.0–8.3)

## 2010-08-19 LAB — CBC
HCT: 39.3 % (ref 39.0–52.0)
HCT: 40.1 % (ref 39.0–52.0)
Hemoglobin: 12.8 g/dL — ABNORMAL LOW (ref 13.0–17.0)
Hemoglobin: 13.3 g/dL (ref 13.0–17.0)
MCHC: 33 g/dL (ref 30.0–36.0)
MCHC: 33.8 g/dL (ref 30.0–36.0)
MCV: 93.9 fL (ref 78.0–100.0)
MCV: 94.1 fL (ref 78.0–100.0)
Platelets: 464 10*3/uL — ABNORMAL HIGH (ref 150–400)
Platelets: 488 10*3/uL — ABNORMAL HIGH (ref 150–400)
RBC: 4.12 MIL/uL — ABNORMAL LOW (ref 4.22–5.81)
RDW: 15.2 % (ref 11.5–15.5)
RDW: 15.4 % (ref 11.5–15.5)
RDW: 15.5 % (ref 11.5–15.5)
WBC: 11.9 10*3/uL — ABNORMAL HIGH (ref 4.0–10.5)

## 2010-08-19 LAB — URINALYSIS, MICROSCOPIC ONLY
Leukocytes, UA: NEGATIVE
Protein, ur: NEGATIVE mg/dL
Urobilinogen, UA: 0.2 mg/dL (ref 0.0–1.0)

## 2010-08-19 LAB — GLUCOSE, CAPILLARY
Glucose-Capillary: 106 mg/dL — ABNORMAL HIGH (ref 70–99)
Glucose-Capillary: 143 mg/dL — ABNORMAL HIGH (ref 70–99)
Glucose-Capillary: 147 mg/dL — ABNORMAL HIGH (ref 70–99)
Glucose-Capillary: 157 mg/dL — ABNORMAL HIGH (ref 70–99)
Glucose-Capillary: 165 mg/dL — ABNORMAL HIGH (ref 70–99)
Glucose-Capillary: 170 mg/dL — ABNORMAL HIGH (ref 70–99)

## 2010-08-19 LAB — PROTIME-INR
INR: 2.29 — ABNORMAL HIGH (ref 0.00–1.49)
Prothrombin Time: 25 seconds — ABNORMAL HIGH (ref 11.6–15.2)
Prothrombin Time: 25.8 seconds — ABNORMAL HIGH (ref 11.6–15.2)

## 2010-08-19 LAB — BASIC METABOLIC PANEL
BUN: 15 mg/dL (ref 6–23)
CO2: 26 mEq/L (ref 19–32)
CO2: 32 mEq/L (ref 19–32)
Chloride: 100 mEq/L (ref 96–112)
Chloride: 98 mEq/L (ref 96–112)
Creatinine, Ser: 0.93 mg/dL (ref 0.4–1.5)
GFR calc non Af Amer: >60 mL/min (ref >60)
Glucose, Bld: 143 mg/dL — ABNORMAL HIGH (ref 70–99)
Potassium: 4.9 mEq/L (ref 3.5–5.1)
Potassium: 5.5 mEq/L — ABNORMAL HIGH (ref 3.5–5.1)
Sodium: 139 mEq/L (ref 135–145)

## 2010-08-19 LAB — HEMOGLOBIN A1C: Hgb A1c MFr Bld: 7 % — ABNORMAL HIGH (ref 4.6–6.1)

## 2010-08-19 LAB — DIFFERENTIAL
Basophils Relative: 1 % (ref 0–1)
Monocytes Absolute: 1.1 10*3/uL — ABNORMAL HIGH (ref 0.1–1.0)
Monocytes Relative: 9 % (ref 3–12)
Neutro Abs: 9.5 10*3/uL — ABNORMAL HIGH (ref 1.7–7.7)

## 2010-08-19 LAB — HEPATITIS PANEL, ACUTE: Hep A IgM: NEGATIVE

## 2010-08-19 LAB — EXTRACTABLE NUCLEAR ANTIGEN ANTIBODY
SSB (La) (ENA) Antibody, IgG: <0.2 AI (ref <1.0)
Scleroderma (Scl-70) (ENA) Antibody, IgG: <0.2 AI (ref <1.0)

## 2010-08-19 LAB — RHEUMATOID FACTOR: Rhuematoid fact SerPl-aCnc: 23 IU/mL — ABNORMAL HIGH (ref 0–20)

## 2010-08-19 LAB — PHOSPHORUS: Phosphorus: 4.4 mg/dL (ref 2.3–4.6)

## 2010-08-19 LAB — ANTI-NEUTROPHIL ANTIBODY

## 2010-08-19 LAB — ANA: Anti Nuclear Antibody(ANA): NEGATIVE

## 2010-08-19 LAB — TSH: TSH: 1.185 u[IU]/mL (ref 0.350–4.500)

## 2010-08-20 LAB — CBC
HCT: 39.7 % (ref 39.0–52.0)
Hemoglobin: 13.4 g/dL (ref 13.0–17.0)
MCV: 93.6 fL (ref 78.0–100.0)
RDW: 14.4 % (ref 11.5–15.5)
WBC: 14.8 10*3/uL — ABNORMAL HIGH (ref 4.0–10.5)

## 2010-08-20 LAB — BASIC METABOLIC PANEL
Chloride: 100 mEq/L (ref 96–112)
GFR calc non Af Amer: >60 mL/min (ref >60)
Glucose, Bld: 127 mg/dL — ABNORMAL HIGH (ref 70–99)
Potassium: 5.1 mEq/L (ref 3.5–5.1)
Sodium: 139 mEq/L (ref 135–145)

## 2010-08-20 LAB — POCT CARDIAC MARKERS
Myoglobin, poc: 112 ng/mL (ref 12–200)
Troponin i, poc: <0.05 ng/mL (ref 0.00–0.09)

## 2010-08-20 LAB — DIFFERENTIAL
Eosinophils Relative: 2 % (ref 0–5)
Lymphocytes Relative: 13 % (ref 12–46)
Lymphs Abs: 2 10*3/uL (ref 0.7–4.0)
Monocytes Absolute: 1.5 10*3/uL — ABNORMAL HIGH (ref 0.1–1.0)

## 2010-10-12 NOTE — Op Note (Signed)
[place]  Patient:    [name], [name] Visit Number: [id number] MRN: [id number]          Service Type: DSU Location: [facility] Attending Physician:  Susa Day Dictated by:   Katy Fitch Naaman Plummer., M.D. Proc. Date: 02/13/01 Admit Date:  02/13/2001   CC:         Two copies to Dr. Madilyn Hook T. Dreiling, M.D.   Operative Report  PREOPERATIVE DIAGNOSIS:  Severe Dupuytrens contracture affecting right hand with profound involvement of right small finger including palm, pretendinous fibers, long finger including palm pretendinous fibers, ring finger pretendinous fibers in the palm only and metacarpal phalangeal region of thumb.  POSTOPERATIVE DIAGNOSIS:  Severe Dupuytrens contracture affecting right hand with profound involvement of right small finger including palm, pretendinous fibers, long finger including palm pretendinous fibers, ring finger pretendinous fibers in the palm only and metacarpal phalangeal region of thumb.  OPERATION: 1. Excision of very complex Dupuytrens palmar fibromatosis involving the    small finger from the distal interphalangeal flexion crease to the    carpal tunnel. 2. Excision of complex Dupuytrens contracture from diaphyseal region of    middle phalanx to carpal tunnel. 3. Excision of palmar fascia in palm, ring finger. 4. Excision of metacarpophalangeal region nodules and natatory ligament    contracture right thumb and thumb index web space.  SURGEON:  Harley Alto., M.D.  ASSISTANT:  Jonni Sanger, P.A.  ANESTHESIA:  General by LMA  SUPERVISING ANESTHESIOLOGIST:  Burna Forts, M.D.  INDICATIONS:  [name] is a 75 year old man referred by Dr. Amada Jupiter T. Dreiling for evaluation and management of a complex Dupuytrens contracture affecting his right hand.  He had early disease on the left as well.  He had severe flexion contractures of his small finger IP joints and MP  joint to where he could barely extend his finger away from the palm.  The long finger had extensive nodular disease extending from the middle phalangeal diaphysis proximally to the carpal canal and there was severe involvement of his pretendinous fibers to the ring finger and the palm without significant nodular disease in the ring finger.  There was a significant nodule on the palmar aspect of his thumb MP joint and early contracture of the natatory ligaments.  He requested surgical extension.  Preoperatively, he was advised of the potential risks and benefits of surgery. He understands that Dupuytrens disease will absolutely recur in this hand as it is a biological process that can not be controlled with surgery; however, we will remove all pathologic fascia at this time.  It should be able to relief his flexion contractures and restore improved range of motion of his hand.  He understands there is a small risks of reflex sympathetic dystrophy, infection and anesthetic complications.  DESCRIPTION OF PROCEDURE:  [name] is brought to the operating room and placed in the supine position upon the operating table.  Following induction of general anesthesia by LMA, the right arm was prepped with Betadine soap and solution and sterilely draped.  Following exsanguination of the limb with an Esmarch bandage, the arterial tourniquet was inflated to 220 mmHg. The procedure commenced with planning extensive Brunner zigzag incisions from the DIP flexion crease of the small finger to the carpal tunnel and the middle phalangeal segment of the long finger to the carpal tunnel as well as a Brunners zigzag incision at the base of the thumb.  Skin incision  was taken sharply and meticulous dissection separated the normal dermis from the pathologic fascia.  Extensive dissection in the small finger removed lateral sheath involvement, retrovascular cords and spiral bands as well as extensive  subdermal nodular disease of the proximal phalangeal segment.  Care was taken to preserve the proper digital arteries and nerves. A similar dissection was performed in the long finger with removal of the entire pretendinous fibers, the natatory ligaments, a spiral band on the radial aspect of the finger, extensive nodular disease over the proximal phalanx and middle phalangeal segments of the finger.  After removal of the pathologic fascia, the lateral sheaths of the finger were inspected and several bands were removed from the small finger on its ulna aspect.  There was a significant cord between the abductor digiti minimi to the proximal phalangeal segment of the small finger and an unusual cord from the radial aspect of the extensor mechanism to the lumbrical and Graysons ligament region in the long finger.  The ring finger disease was removed by undermining the skin between the two incisions and removing the pretendinous fibers to the ring finger subcutaneously.  Care was taken to protect the superficial palmar arch and the common digital vessels during dissection.  A Brunners zigzag incision was then fashioned at the base of the thumb followed by careful isolation of the radial and ulnar proper digital nerves and removal of all pathologic appearing fashion.  At the conclusion of the procedure, the MP and IP joints could be fully extended.  Extensive skin apportionment was completed in the small finger removing several areas of skin that were redundant and filled with subdermal disease. The incisions were converted to Brunner incisions by V-Y advancement technique removing most of the more severely involved skin.  The wounds were then inspected for bleeding points and after thorough irrigation closed with corner sutures of 5-0 Nylon interrupted sutures of 5-0 Nylon using simple and horizontal mattress style closure.   A similar closure was affected for the long finger and thumb  index web space.  The wounds were then dressed with Xeroflow and the tourniquet released.  Firm compression was applied to the wound for approximately ten minutes to obtain hemostasis followed by placement of a voluminous gauze dressing and a volar plaster splint maintaining the MP joint in full extension. There were no apparent complications.  [name] tolerated the surgery and anesthesia well. He was transferred to the recovery room with stable vital signs.  He will be discharged with prescriptions for Percocet 5 mg one or two tablets p.o. q.4-6h. p.r.n. pain. A total of 30 tablets without refill.  Also Keflex 500 mg one p.o. q.8h. x4 days as a prophylactic antibiotic.  We have advised him to use ibuprofen 600 mg p.o. q.6h. p.r.n. pain in addition to Percocet.Dictated by:   Katy Fitch Naaman Plummer., M.D. Attending Physician:  Susa Day DD:  02/13/01 TD:  02/13/01 Job: [id number]

## 2010-10-12 NOTE — Op Note (Signed)
[name], [name]                        ACCOUNT NO.:  [id number]   MEDICAL RECORD NO.:  [id number]                   PATIENT TYPE:  OBV   LOCATION:  0365                                 FACILITY:  [facility]   PHYSICIAN:  Timothy E. Earlene Plater, M.D.              DATE OF BIRTH:  [birth year]   DATE OF PROCEDURE:  11/15/2002  DATE OF DISCHARGE:                                 OPERATIVE REPORT   PREOPERATIVE DIAGNOSES:  Elevated cholesterol and triglycerides, abnormal  liver function studies and gallstones.   POSTOPERATIVE DIAGNOSES:  Chronic cholecystitis, gallstones, elevated liver  function studies.   PROCEDURE:  Laparoscopic cholecystectomy and operative cholangiogram.   SURGEON:  Timothy E. Earlene Plater, M.D.   ASSISTANT:  Ollen Gross. Carolynne Edouard, M.D.   ANESTHESIA:  General.   INDICATIONS FOR PROCEDURE:  [name] is 18, has a known history of  extremely high cholesterol with triglycerides, multiple modes of therapy  have been instituted and liver function studies have vacillated depending  upon the medication. He is known to have diffuse fatty infiltration of the  liver on ultrasound and gallstones uncomplicated. He is relatively  asymptomatic with the gallstones but prefers to go ahead and have the  cholecystectomy at this time and it has been carefully discussed. Today his  liver function studies are elevated. The SGOT is 54, SGPT is 51, bilirubin  is 1.5, otherwise his laboratory data are normal. He was identified and the  permit signed, evaluated by anesthesia.   DESCRIPTION OF PROCEDURE:  He was taken to the operating room, placed  supine, general endotracheal anesthesia administered. The abdomen was  prepped and draped in the usual fashion. An infraumbilical incision made  after injection of local anesthesia. The fascia identified, opened  vertically, the peritoneum entered without complications. Hasson catheter  placed, tied in place with a #1 Vicryl. The abdomen was insufflated, a  second millimeter trocar was placed in the mid epigastrium, two 5 mm trocars  in the right upper quadrant. Peritoneoscopy revealed a small right inguinal  hernia, some innocent adhesions of the sigmoid colon. The gallbladder  appeared indolent. The gallbladder was grasped, placed on tension and  multiple adhesions of the omentum were taken down sharply and the entire  gallbladder was visualized. Careful dissection at the base of the  gallbladder revealed a normal ampulla of the gallbladder and the cystic duct  entering the gallbladder. This was all dissected out completely and a large  window was made posterior to the gallbladder. The cystic duct was clipped  near the gallbladder, small incision made in the cystic duct, it was tiny  and the bile appeared normal. The Reddick catheter was introduced  percutaneously and placed into the stomach and cystic duct and a clip  applied. Using real-time fluoroscopy and 1/2 strength Hypaque dye, a  cholangiogram was made showing a long but otherwise normal common duct with  a substantial cystic duct filling of  the hepatic system and free flow of dye  into the duodenum. With this thought to be normal, the catheter and clip  were removed. The stump of the cystic duct triply clipped and the cystic  duct completely divided. Behind this was a small cystic artery, this was  triply clipped and divided and the gallbladder was dissected off the liver  in the usual fashion. There were no complications, no bleeding and no bile  leak. A tiny bit of bleeding in the area of prior dissection and several  clips were applied. The bed was dry, the irrigant was clear. The gallbladder  was removed through the infraumbilical incision, passed off the field for  pathology. All irrigant,  CO2, instruments and trocars removed under direct vision. Counts were  correct. All of the puncture sites were inspected and the skin was closed  with 3-0 Monocryl. He tolerated it well and  was awakened and taken to the  recovery room in good condition. The patient will be admitted, observed and  followed as an outpatient.                                               Timothy E. Earlene Plater, M.D.    TED/MEDQ  D:  11/15/2002  T:  11/15/2002  Job:  [id number]   cc:   Holley Bouche, M.D.  [address]  [place], [place] [address]  Fax: [phone]

## 2011-04-26 DIAGNOSIS — N400 Enlarged prostate without lower urinary tract symptoms: Secondary | ICD-10-CM | POA: Insufficient documentation

## 2011-04-26 DIAGNOSIS — E782 Mixed hyperlipidemia: Secondary | ICD-10-CM | POA: Insufficient documentation

## 2011-06-12 DIAGNOSIS — J449 Chronic obstructive pulmonary disease, unspecified: Secondary | ICD-10-CM | POA: Insufficient documentation

## 2011-07-30 IMAGING — CR DG CHEST 2V
2 series · 2 of 2 positions shown · non-contrast
Comparison: 08/03/2009

CLINICAL DATA: Follow-up pneumonia.

CHEST - 2 VIEW

[w chest pa]
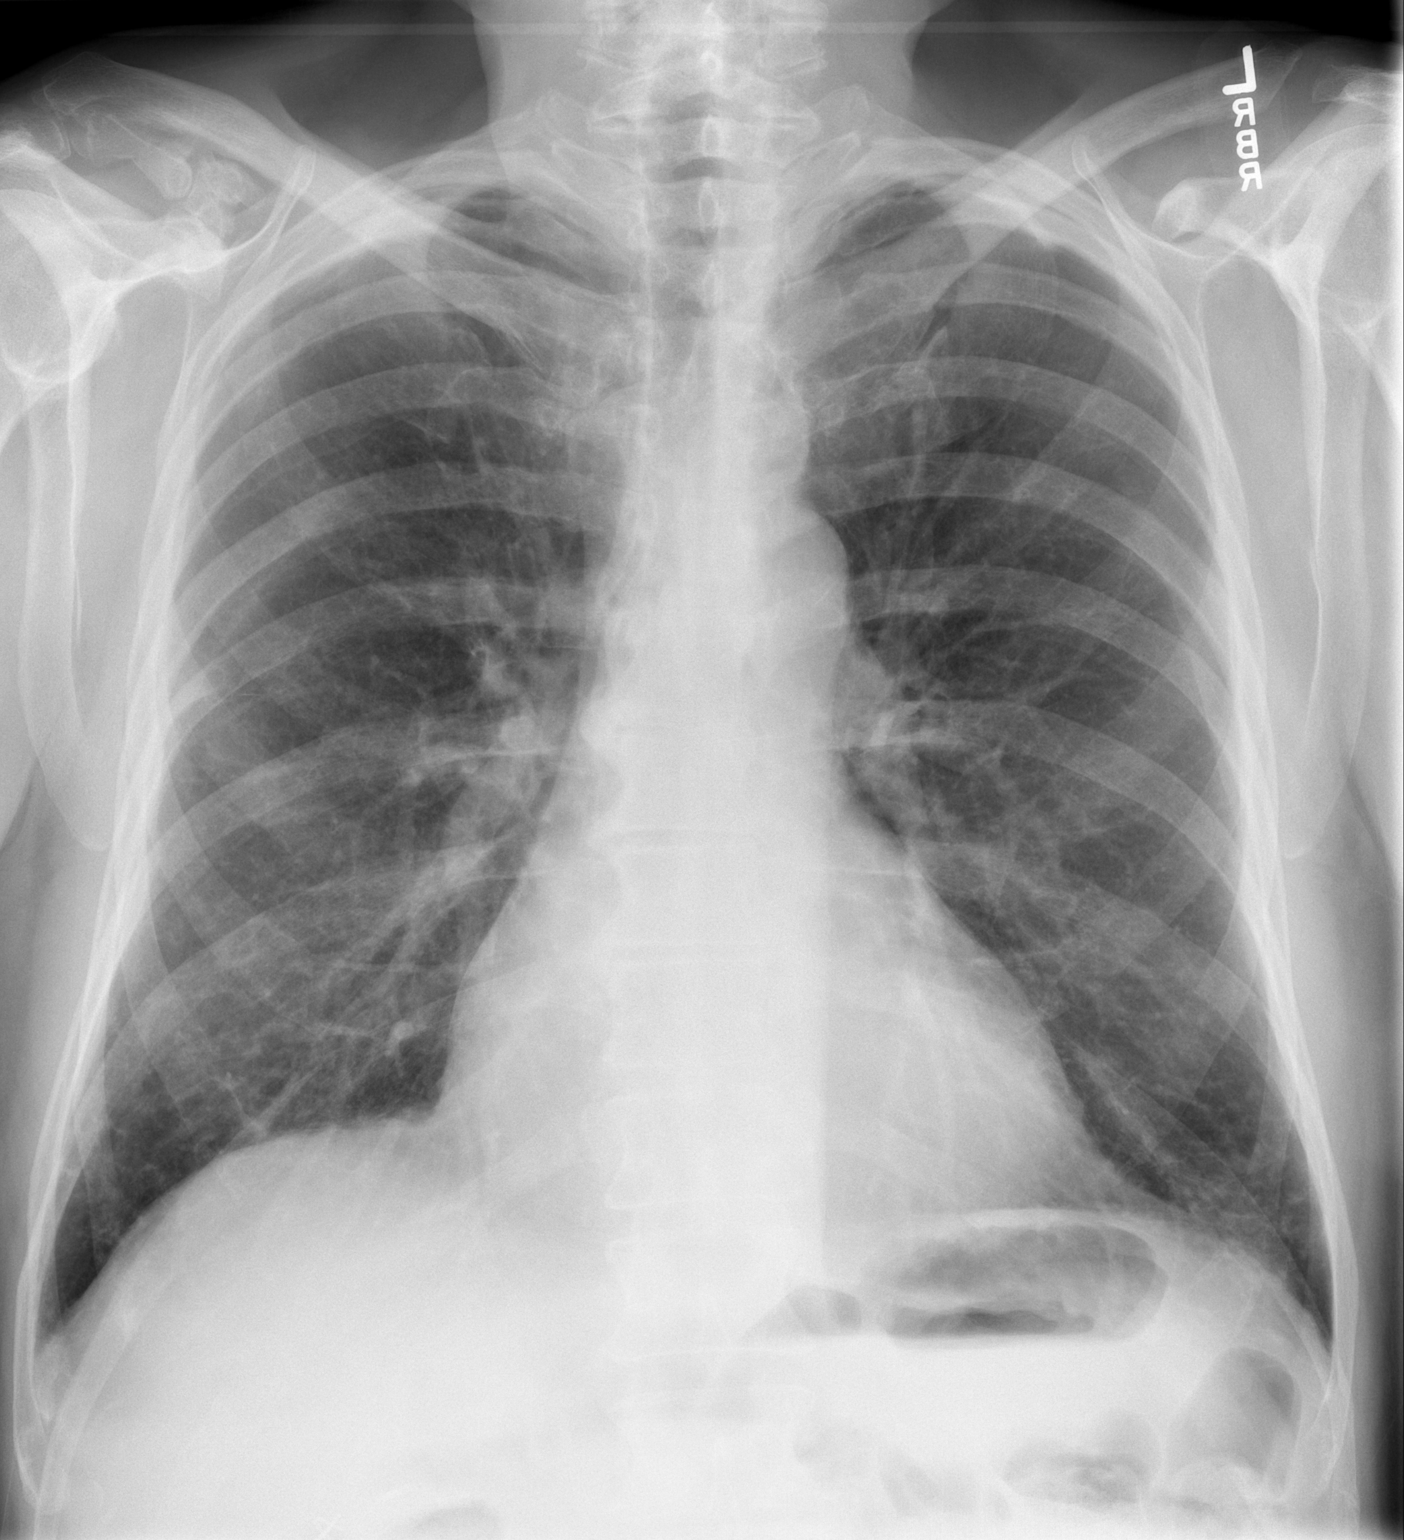

[w chest lat]
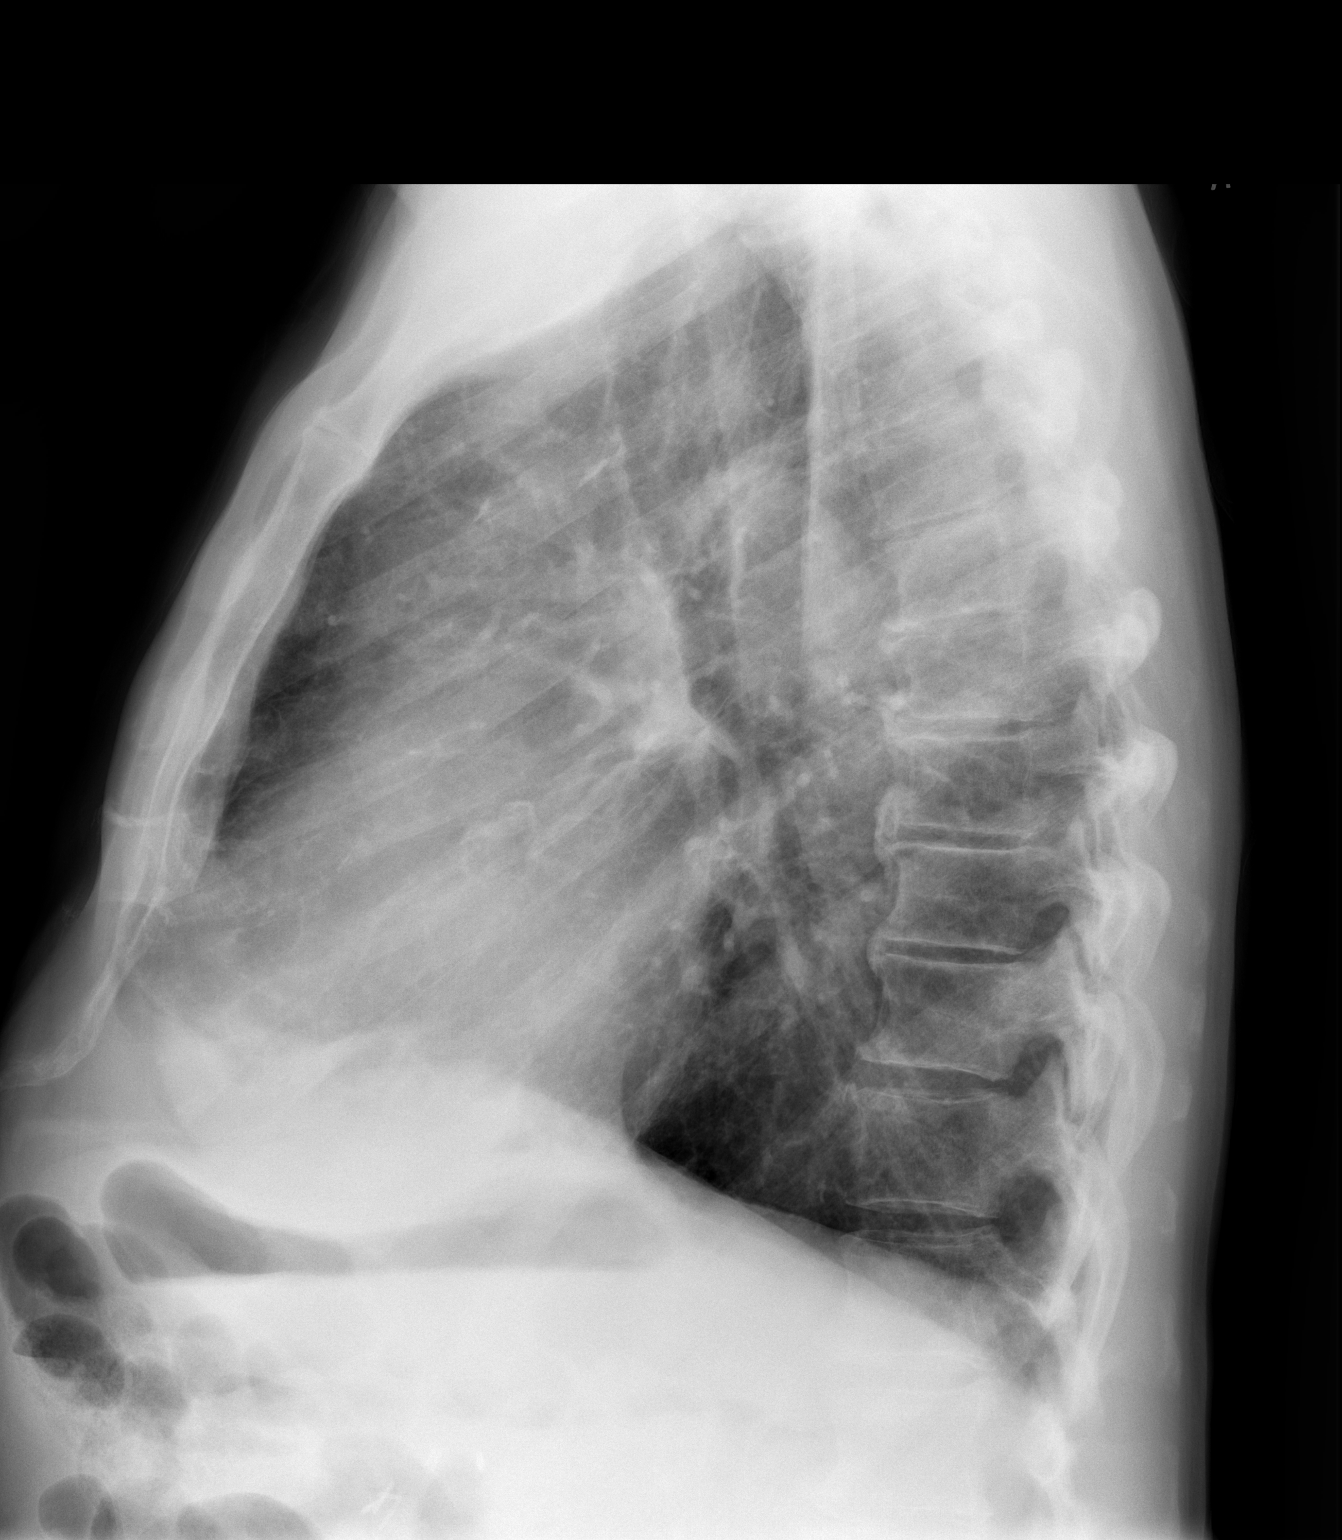

[2 of 2 positions shown; findings below may reference images not displayed]

FINDINGS: Mild pulmonary interstitial prominence is again seen
bilaterally and has not significantly changed.  This is suspicious
for underlying chronic lung disease.  No evidence of pulmonary
consolidation or pleural effusion.  Heart size and mediastinal
contours are within normal limits.
IMPRESSION: Stable exam.  Probable mild chronic interstitial lung disease.  No
acute findings.

## 2012-07-23 DIAGNOSIS — I4510 Unspecified right bundle-branch block: Secondary | ICD-10-CM | POA: Insufficient documentation

## 2012-07-23 DIAGNOSIS — I10 Essential (primary) hypertension: Secondary | ICD-10-CM | POA: Insufficient documentation

## 2012-12-23 ENCOUNTER — Other Ambulatory Visit: Payer: Self-pay | Admitting: Dermatology

## 2013-03-17 DIAGNOSIS — Z95810 Presence of automatic (implantable) cardiac defibrillator: Secondary | ICD-10-CM | POA: Insufficient documentation

## 2013-06-20 DIAGNOSIS — I5042 Chronic combined systolic (congestive) and diastolic (congestive) heart failure: Secondary | ICD-10-CM | POA: Insufficient documentation

## 2013-06-22 DIAGNOSIS — K861 Other chronic pancreatitis: Secondary | ICD-10-CM | POA: Insufficient documentation

## 2015-10-17 DIAGNOSIS — I428 Other cardiomyopathies: Secondary | ICD-10-CM | POA: Insufficient documentation

## 2018-04-22 ENCOUNTER — Ambulatory Visit (INDEPENDENT_AMBULATORY_CARE_PROVIDER_SITE_OTHER): Payer: Medicare Other | Admitting: Podiatry

## 2018-04-22 ENCOUNTER — Other Ambulatory Visit: Payer: Self-pay

## 2018-04-22 ENCOUNTER — Ambulatory Visit (INDEPENDENT_AMBULATORY_CARE_PROVIDER_SITE_OTHER): Payer: Medicare Other

## 2018-04-22 ENCOUNTER — Encounter: Payer: Self-pay | Admitting: Podiatry

## 2018-04-22 VITALS — BP 105/68 | HR 82

## 2018-04-22 DIAGNOSIS — L97522 Non-pressure chronic ulcer of other part of left foot with fat layer exposed: Secondary | ICD-10-CM | POA: Diagnosis not present

## 2018-04-22 DIAGNOSIS — M7752 Other enthesopathy of left foot: Secondary | ICD-10-CM

## 2018-04-22 DIAGNOSIS — L97529 Non-pressure chronic ulcer of other part of left foot with unspecified severity: Secondary | ICD-10-CM

## 2018-04-22 DIAGNOSIS — M898X7 Other specified disorders of bone, ankle and foot: Secondary | ICD-10-CM

## 2018-04-22 DIAGNOSIS — I83025 Varicose veins of left lower extremity with ulcer other part of foot: Secondary | ICD-10-CM

## 2018-04-22 MED ORDER — GENTAMICIN SULFATE 0.1 % EX CREA: 1.0000 "application " | TOPICAL_CREAM | Freq: Two times a day (BID) | CUTANEOUS | 1 refills | Status: DC

## 2018-04-26 NOTE — Progress Notes (Signed)
   Subjective:  82 year old male presenting today as a new patient, referred by Dr. Elijah Birk, with a chief complaint of pain to the left medial plantar foot that has been ongoing for the past 1-2 years. He reports a nodule noted to the foot that he has tried "cutting on" himself but Dr. Elijah Birk said there was a bone causing the problem. Walking increases the pain. He has been using a CAM boot as directed. Patient is here for further evaluation and treatment.   History reviewed. No pertinent past medical history.   Objective/Physical Exam General: The patient is alert and oriented x3 in no acute distress.  Dermatology:  Wound #1 noted to the left foot measuring 0.6 x 0.6 x 0.1 cm (LxWxD).   To the noted ulceration(s), there is no eschar. There is a moderate amount of slough, fibrin, and necrotic tissue noted. Granulation tissue and wound base is red. There is a minimal amount of serosanguineous drainage noted. There is no exposed bone muscle-tendon ligament or joint. There is no malodor. Periwound integrity is intact. Skin is warm, dry and supple bilateral lower extremities.  Vascular: Palpable pedal pulses bilaterally. Mild edema noted. Capillary refill within normal limits. Varicosities noted bilateral lower extremities.   Neurological: Epicritic and protective threshold diminished bilaterally.   Musculoskeletal Exam: Prominent navicular exostosis of the left foot noted. Range of motion within normal limits to all pedal and ankle joints bilateral. Muscle strength 5/5 in all groups bilateral.   Radiographic Exam:  Normal osseous mineralization. Joint spaces preserved. No fracture/dislocation/boney destruction. Prominent navicular exostosis of the left foot noted.     Assessment: #1 Ulceration of the left foot secondary to venous insufficiency #2 varicosities bilateral lower extremities #3 Prominent navicular exostosis left contributing to ulcer  Plan of Care:  #1 Patient was evaluated. X-Rays  reviewed.  #2 medically necessary excisional debridement including subcutaneous tissue was performed using a tissue nipper and a chisel blade. Excisional debridement of all the necrotic nonviable tissue down to healthy bleeding viable tissue was performed with post-debridement measurements same as pre-. #3 the wound was cleansed with normal saline. #4 Prescription for Gentamicin cream provided to patient to use daily with a bandage.  #5 Continue weightbearing in CAM boot.  #6 Return to clinic in 3 weeks to discuss possible navicular exostectomy.    Felecia Shelling, DPM [facility]  Dr. Felecia Shelling, DPM    [address]                                        [place], [place] [address]                Office [phone]  Fax [phone]

## 2018-05-13 ENCOUNTER — Ambulatory Visit (INDEPENDENT_AMBULATORY_CARE_PROVIDER_SITE_OTHER): Payer: Medicare Other | Admitting: Podiatry

## 2018-05-13 DIAGNOSIS — L97522 Non-pressure chronic ulcer of other part of left foot with fat layer exposed: Secondary | ICD-10-CM | POA: Diagnosis not present

## 2018-05-13 DIAGNOSIS — M7752 Other enthesopathy of left foot: Secondary | ICD-10-CM | POA: Diagnosis not present

## 2018-05-13 DIAGNOSIS — M898X7 Other specified disorders of bone, ankle and foot: Secondary | ICD-10-CM | POA: Diagnosis not present

## 2018-05-17 NOTE — Progress Notes (Signed)
   Subjective:  82 year old male presenting today for follow up evaluation of an ulceration of the left foot. He states the wound looks better and has healed properly. He has been applying Gentamicin cream to the area for treatment. He denies modifying factors. Patient is here for further evaluation and treatment.   No past medical history on file.   Objective/Physical Exam General: The patient is alert and oriented x3 in no acute distress.  Dermatology:  Wound noted to the left foot has healed. Complete re-epithelialization has occurred. No drainage noted.   Vascular: Palpable pedal pulses bilaterally. Mild edema noted. Capillary refill within normal limits. Varicosities noted bilateral lower extremities.   Neurological: Epicritic and protective threshold diminished bilaterally.   Musculoskeletal Exam: Prominent navicular exostosis of the left foot noted. Range of motion within normal limits to all pedal and ankle joints bilateral. Muscle strength 5/5 in all groups bilateral.    Assessment: #1 Ulceration of the left foot secondary to venous insufficiency - healed #2 varicosities bilateral lower extremities #3 Prominent navicular exostosis left contributing to ulcer  Plan of Care:  #1 Patient was evaluated.  #2 Light debridement of ulceration area with tissue nipper.  #3 Recommended good shoe gear.  #4 Return to clinic in 3 months for routine care.   Felecia Shelling, DPM [facility]  Dr. Felecia Shelling, DPM    [address]                                        [place], [place] [address]                Office [phone]  Fax [phone]

## 2018-07-22 ENCOUNTER — Telehealth: Payer: Self-pay | Admitting: Podiatry

## 2018-07-22 NOTE — Telephone Encounter (Signed)
Pt called and was scheduled to see you on 3.18.2020 for follow up on ulcer and possible surgical consult but canceled the appt. He states the ulcer has healed and will call if needed.

## 2018-08-12 ENCOUNTER — Ambulatory Visit: Payer: Medicare Other | Admitting: Podiatry

## 2020-07-24 ENCOUNTER — Other Ambulatory Visit: Payer: Self-pay

## 2020-07-24 ENCOUNTER — Encounter (HOSPITAL_BASED_OUTPATIENT_CLINIC_OR_DEPARTMENT_OTHER): Payer: Medicare HMO | Attending: Internal Medicine | Admitting: Internal Medicine

## 2020-07-24 DIAGNOSIS — I429 Cardiomyopathy, unspecified: Secondary | ICD-10-CM | POA: Insufficient documentation

## 2020-07-24 DIAGNOSIS — L97528 Non-pressure chronic ulcer of other part of left foot with other specified severity: Secondary | ICD-10-CM | POA: Diagnosis present

## 2020-07-24 DIAGNOSIS — I10 Essential (primary) hypertension: Secondary | ICD-10-CM | POA: Insufficient documentation

## 2020-07-24 DIAGNOSIS — Z95810 Presence of automatic (implantable) cardiac defibrillator: Secondary | ICD-10-CM | POA: Insufficient documentation

## 2020-07-25 NOTE — Progress Notes (Signed)
DEEPAK, BLESS ([id number]) Visit Report for 07/24/2020 Chief Complaint Document Details Patient Name: Date of Service: PEA RMA N, [name] 07/24/2020 1:15 PM Medical Record Number: [id number] Patient Account Number: [id number] Date of Birth/Sex: Treating RN: [birth year] (84 y.o. Male) Levan Hurst Primary Care Provider: Anastasia Pall Other Clinician: Referring Provider: Treating Provider/Extender: Wende Mott in Treatment: 0 Information Obtained from: Patient Chief Complaint 07/24/2020; patient is here for review of wound on his left mid plantar foot Electronic Signature(s) Signed: 07/24/2020 6:21:54 PM By: Linton Ham MD Entered By: Linton Ham on 07/24/2020 15:39:05 -------------------------------------------------------------------------------- Debridement Details Patient Name: Date of Service: PEA RMA N, [name] 07/24/2020 1:15 PM Medical Record Number: [id number] Patient Account Number: [id number] Date of Birth/Sex: Treating RN: Jun 27, 1935 (84 y.o. Male) Levan Hurst Primary Care Provider: Anastasia Pall Other Clinician: Referring Provider: Treating Provider/Extender: Wende Mott in Treatment: 0 Debridement Performed for Assessment: Wound #1 Left,Medial,Plantar Foot Performed By: Physician Ricard Dillon., MD Debridement Type: Debridement Level of Consciousness (Pre-procedure): Awake and Alert Pre-procedure Verification/Time Out Yes - 15:06 Taken: Start Time: 15:06 T Area Debrided (L x W): otal 0.4 (cm) x 0.2 (cm) = 0.08 (cm) Tissue and other material debrided: Viable, Non-Viable, Callus, Subcutaneous Level: Skin/Subcutaneous Tissue Debridement Description: Excisional Instrument: Curette Bleeding: Minimum Hemostasis Achieved: Pressure End Time: 15:07 Procedural Pain: 0 Post Procedural Pain: 0 Response to Treatment: Procedure was tolerated well Level of Consciousness (Post- Awake and  Alert procedure): Post Debridement Measurements of Total Wound Length: (cm) 0.4 Stage: Category/Stage II Width: (cm) 0.2 Depth: (cm) 0.3 Volume: (cm) 0.019 Character of Wound/Ulcer Post Debridement: Improved Post Procedure Diagnosis Same as Pre-procedure Electronic Signature(s) Signed: 07/24/2020 5:56:44 PM By: Levan Hurst RN, BSN Signed: 07/24/2020 6:21:54 PM By: Linton Ham MD Entered By: Levan Hurst on 07/24/2020 15:07:01 -------------------------------------------------------------------------------- HPI Details Patient Name: Date of Service: PEA RMA N, [name] 07/24/2020 1:15 PM Medical Record Number: [id number] Patient Account Number: [id number] Date of Birth/Sex: Treating RN: [birth year] (85 y.o. Male) Levan Hurst Primary Care Provider: Anastasia Pall Other Clinician: Referring Provider: Treating Provider/Extender: Wende Mott in Treatment: 0 History of Present Illness HPI Description: ADMISSION 07/24/2020 This is a 85 year old man who has had a wound on his left medial mid plantar foot for 2 years. He states this started as a blister. He saw Dr. Darene Lamer a local [facility] podiatrist and then subsequently was referred to Dr. Amalia Hailey in November 2019 noted he had an ulcer of the left foot and x-ray showed navicular exostosis. There was some discussion about surgery at that time however the wound healed within a month and noting his significant cardiac issues no further surgical work-up was entertained. The patient has currently been putting gentamicin on the wound he has an insole. Past medical history includes heart failure with reduced ejection fraction, chronic atrial fibrillation, ICD placement, hypertension, hypercholesterolemia, COPD ABI in our clinic was 1.1 on the left Electronic Signature(s) Signed: 07/24/2020 6:21:54 PM By: Linton Ham MD Entered By: Linton Ham on 07/24/2020  15:41:42 -------------------------------------------------------------------------------- Physical Exam Details Patient Name: Date of Service: PEA RMA N, [name] 07/24/2020 1:15 PM Medical Record Number: [id number] Patient Account Number: [id number] Date of Birth/Sex: Treating RN: 11/02/1935 (85 y.o. Male) Levan Hurst Primary Care Provider: Anastasia Pall Other Clinician: Referring Provider: Treating Provider/Extender: Wende Mott in Treatment: 0 Constitutional Sitting or standing Blood Pressure is within target range for patient.. Pulse regular and within target range for patient.Marland Kitchen Respirations  regular, non-labored and within target range.. Temperature is normal and within the target range for the patient.. Appears in no distress. Respiratory work of breathing is normal. Cardiovascular Pedal pulses easily palpable. Integumentary (Hair, Skin) No erythema around the wound. Notes Wound exam; plantar left foot medial aspect. There is a small wound with callus around it. I remove the callus thick skin subcutaneous tissue and fibrinous debris from the wound surface this cleans up quite nicely. As to causation there is indeed a subluxed bone underneath this that is palpable this is I think the navicular bone. There is no evidence of surrounding infection the wound does not have much depth Electronic Signature(s) Signed: 07/24/2020 6:21:54 PM By: Robson, Michael MD Entered By: Robson, Michael on 07/24/2020 15:42:52 -------------------------------------------------------------------------------- Physician Orders Details Patient Name: Date of Service: PEA RMA N, [name] 07/24/2020 1:15 PM Medical Record Number: [id number] Patient Account Number: [id number] Date of Birth/Sex: Treating RN: 08/27/1935 (84 y.o. Male) Lynch, Shatara Primary Care Provider: Badger, Michael Other Clinician: Referring Provider: Treating Provider/Extender: Robson, Michael Badger,  Michael Weeks in Treatment: 0 Verbal / Phone Orders: No Diagnosis Coding Follow-up Appointments Return Appointment in 1 week. Bathing/ Shower/ Hygiene May shower and wash wound with soap and water. - on days that dressing is changed Off-Loading Open toe surgical shoe to: - left foot with felt Wound Treatment Wound #1 - Foot Wound Laterality: Plantar, Left, Medial Cleanser: Soap and Water Every Other Day/7 Days Discharge Instructions: May shower and wash wound with dial antibacterial soap and water prior to dressing change. Cleanser: [employer] Ancillary Kit - 15 Day Supply (DME) (Generic) Every Other Day/7 Days Discharge Instructions: Use supplies as instructed; Kit contains: (15) Saline Bullets; (15) 3x3 Gauze; 15 pr Gloves Prim Dressing: KerraCel Ag Gelling Fiber Dressing, 2x2 in (silver alginate) (DME) (Generic) Every Other Day/7 Days ary Discharge Instructions: Apply silver alginate to wound bed as instructed Secondary Dressing: Woven Gauze Sponge, Non-Sterile 4x4 in (DME) (Generic) Every Other Day/7 Days Discharge Instructions: Apply over primary dressing as directed. Secured With: Kerlix Roll Sterile, 4.5x3.1 (in/yd) (DME) (Generic) Every Other Day/7 Days Discharge Instructions: Secure with Kerlix as directed. Secured With: Paper Tape, 2x10 (in/yd) (DME) (Generic) Every Other Day/7 Days Discharge Instructions: Secure dressing with tape as directed. Electronic Signature(s) Signed: 07/24/2020 5:56:44 PM By: Lynch, Shatara RN, BSN Signed: 07/24/2020 6:21:54 PM By: Robson, Michael MD Entered By: Lynch, Shatara on 07/24/2020 15:11:43 -------------------------------------------------------------------------------- Problem List Details Patient Name: Date of Service: PEA RMA N, [name] 07/24/2020 1:15 PM Medical Record Number: [id number] Patient Account Number: [id number] Date of Birth/Sex: Treating RN: 07/04/1935 (84 y.o. Male) Lynch, Shatara Primary Care Provider: Other  Clinician: Badger, Michael Referring Provider: Treating Provider/Extender: Robson, Michael Badger, Michael Weeks in Treatment: 0 Active Problems ICD-10 Encounter Code Description Active Date MDM Diagnosis L97.528 Non-pressure chronic ulcer of other part of left foot with other specified 07/24/2020 No Yes severity I42.9 Cardiomyopathy, unspecified 07/24/2020 No Yes Inactive Problems Resolved Problems Electronic Signature(s) Signed: 07/24/2020 6:21:54 PM By: Robson, Michael MD Entered By: Robson, Michael on 07/24/2020 15:37:07 -------------------------------------------------------------------------------- Progress Note Details Patient Name: Date of Service: PEA RMA N, [name] 07/24/2020 1:15 PM Medical Record Number: [id number] Patient Account Number: [id number] Date of Birth/Sex: Treating RN: 06/06/1935 (84 y.o. Male) Lynch, Shatara Primary Care Provider: Badger, Michael Other Clinician: Referring Provider: Treating Provider/Extender: Robson, Michael Badger, Michael Weeks in Treatment: 0 Subjective Chief Complaint Information obtained from Patient 07/24/2020; patient is here for review of wound on his left mid plantar   foot History of Present Illness (HPI) ADMISSION 07/24/2020 This is a 85 year old man who has had a wound on his left medial mid plantar foot for 2 years. He states this started as a blister. He saw Dr. Darene Lamer a local [facility] podiatrist and then subsequently was referred to Dr. Amalia Hailey in November 2019 noted he had an ulcer of the left foot and x-ray showed navicular exostosis. There was some discussion about surgery at that time however the wound healed within a month and noting his significant cardiac issues no further surgical work-up was entertained. The patient has currently been putting gentamicin on the wound he has an insole. Past medical history includes heart failure with reduced ejection fraction, chronic atrial fibrillation, ICD placement, hypertension,  hypercholesterolemia, COPD ABI in our clinic was 1.1 on the left Patient History Information obtained from Patient. Allergies prednisolone, glucosamine Family History Diabetes - Father, Heart Disease - Father, Kidney Disease, No family history of Cancer, Hereditary Spherocytosis, Hypertension, Lung Disease, Seizures, Stroke, Thyroid Problems, Tuberculosis. Social History Former smoker, Marital Status - Divorced, Alcohol Use - Rarely, Drug Use - No History, Caffeine Use - Rarely. Medical History Eyes Denies history of Cataracts, Glaucoma, Optic Neuritis Ear/Nose/Mouth/Throat Denies history of Chronic sinus problems/congestion, Middle ear problems Hematologic/Lymphatic Denies history of Anemia, Hemophilia, Human Immunodeficiency Virus, Lymphedema, Sickle Cell Disease Respiratory Patient has history of Chronic Obstructive Pulmonary Disease (COPD) Denies history of Aspiration, Asthma, Pneumothorax, Sleep Apnea, Tuberculosis Cardiovascular Patient has history of Arrhythmia - Afibb, Congestive Heart Failure, Hypertension Denies history of Angina, Coronary Artery Disease, Deep Vein Thrombosis, Hypotension, Myocardial Infarction, Peripheral Arterial Disease, Peripheral Venous Disease, Phlebitis, Vasculitis Gastrointestinal Denies history of Cirrhosis , Colitis, Crohnoos, Hepatitis A, Hepatitis B, Hepatitis C Endocrine Denies history of Type I Diabetes, Type II Diabetes Genitourinary Denies history of End Stage Renal Disease Immunological Denies history of Lupus Erythematosus, Raynaudoos, Scleroderma Integumentary (Skin) Denies history of History of Burn Musculoskeletal Denies history of Gout, Rheumatoid Arthritis, Osteoarthritis, Osteomyelitis Neurologic Denies history of Dementia, Neuropathy, Quadriplegia, Paraplegia, Seizure Disorder Oncologic Denies history of Received Chemotherapy, Received Radiation Psychiatric Denies history of Anorexia/bulimia, Confinement Anxiety Medical  A Surgical History Notes nd Respiratory hx of emphysema, Cardiovascular hx of cardiomyopathy, hyperlipidemia, pt. has a defibb. Endocrine hx of pancreatitis Review of Systems (ROS) Constitutional Symptoms (General Health) Denies complaints or symptoms of Fatigue, Fever, Chills, Marked Weight Change. Eyes Denies complaints or symptoms of Dry Eyes, Vision Changes, Glasses / Contacts. Ear/Nose/Mouth/Throat Denies complaints or symptoms of Chronic sinus problems or rhinitis. Respiratory Denies complaints or symptoms of Chronic or frequent coughs, Shortness of Breath. Cardiovascular Denies complaints or symptoms of Chest pain. Gastrointestinal Denies complaints or symptoms of Frequent diarrhea, Nausea, Vomiting. Endocrine Denies complaints or symptoms of Heat/cold intolerance. Genitourinary Denies complaints or symptoms of Frequent urination. Musculoskeletal Denies complaints or symptoms of Muscle Pain, Muscle Weakness. Neurologic Denies complaints or symptoms of Numbness/parasthesias. Psychiatric Denies complaints or symptoms of Claustrophobia, Suicidal. Objective Constitutional Sitting or standing Blood Pressure is within target range for patient.. Pulse regular and within target range for patient.Marland Kitchen Respirations regular, non-labored and within target range.. Temperature is normal and within the target range for the patient.Marland Kitchen Appears in no distress. Vitals Time Taken: 1:50 PM, Height: 75 in, Source: Stated, Weight: 202 lbs, Source: Stated, BMI: 25.2, Temperature: 98.1 F, Pulse: 71 bpm, Respiratory Rate: 17 breaths/min, Blood Pressure: 129/85 mmHg. Respiratory work of breathing is normal. Cardiovascular Pedal pulses easily palpable. General Notes: Wound exam; plantar left foot medial aspect. There is a small wound with  callus around it. I remove the callus thick skin subcutaneous tissue and fibrinous debris from the wound surface this cleans up quite nicely. As to causation  there is indeed a subluxed bone underneath this that is palpable this is I think the navicular bone. There is no evidence of surrounding infection the wound does not have much depth Integumentary (Hair, Skin) No erythema around the wound. Wound #1 status is Open. Original cause of wound was Gradually Appeared. The date acquired was: 06/27/2018. [place] The wound measures 0.4cm length x 0.2cm width x 0.3cm depth; 0.063cm^2 area and 0.019cm^3 volume. There is no tunneling noted, however, there is undermining starting at 12:00 and ending at 12:00 with a maximum distance of 0.6cm. There is a medium amount of serosanguineous drainage noted. The wound margin is distinct with the outline attached to the wound base. There is large (67-100%) red, pink granulation within the wound bed. There is no necrotic tissue within the wound bed. Assessment Active Problems ICD-10 Non-pressure chronic ulcer of other part of left foot with other specified severity Cardiomyopathy, unspecified Procedures Wound #1 Pre-procedure diagnosis of Wound #1 is a Pressure Ulcer located on the Left,Medial,Plantar Foot . There was a Excisional Skin/Subcutaneous Tissue Debridement with a total area of 0.08 sq cm performed by Ricard Dillon., MD. With the following instrument(s): Curette to remove Viable and Non-Viable tissue/material. Material removed includes Callus and Subcutaneous Tissue and. No specimens were taken. A time out was conducted at 15:06, prior to the start of the procedure. A Minimum amount of bleeding was controlled with Pressure. The procedure was tolerated well with a pain level of 0 throughout and a pain level of 0 following the procedure. Post Debridement Measurements: 0.4cm length x 0.2cm width x 0.3cm depth; 0.019cm^3 volume. Post debridement Stage noted as Category/Stage II. Character of Wound/Ulcer Post Debridement is improved. Post procedure Diagnosis Wound  #1: Same as Pre-Procedure Plan Follow-up Appointments: Return Appointment in 1 week. Bathing/ Shower/ Hygiene: May shower and wash wound with soap and water. - on days that dressing is changed Off-Loading: Open toe surgical shoe to: - left foot with felt WOUND #1: - Foot Wound Laterality: Plantar, Left, Medial Cleanser: Soap and Water Every Other Day/7 Days Discharge Instructions: May shower and wash wound with dial antibacterial soap and water prior to dressing change. Cleanser: [employer] Ancillary Kit - 15 Day Supply (DME) (Generic) Every Other Day/7 Days Discharge Instructions: Use supplies as instructed; Kit contains: (15) Saline Bullets; (15) 3x3 Gauze; 15 pr Gloves Prim Dressing: KerraCel Ag Gelling Fiber Dressing, 2x2 in (silver alginate) (DME) (Generic) Every Other Day/7 Days ary Discharge Instructions: Apply silver alginate to wound bed as instructed Secondary Dressing: Woven Gauze Sponge, Non-Sterile 4x4 in (DME) (Generic) Every Other Day/7 Days Discharge Instructions: Apply over primary dressing as directed. Secured With: [employer], 4.5x3.1 (in/yd) (DME) (Generic) Every Other Day/7 Days Discharge Instructions: Secure with Kerlix as directed. Secured With: Paper T ape, 2x10 (in/yd) (DME) (Generic) Every Other Day/7 Days Discharge Instructions: Secure dressing with tape as directed. 1. We use silver alginate,, gauze, kerlix. Patient is to change this every second day 2. Surgical shoe to replace the running shoe and insole he was wearing 3. The patient would be eligible for a total contact cast which I think would probably heal this wound however the bigger question would be he is likely to need custom foot wear to keep this closed. If that fails then he would be looking  at surgery which he may or may not be a candidate for based on his cardiac status 4. I looked in care everywhere he last saw his cardiologist in early December who said he was going to order an echocardiogram  but I do not see the results of this Electronic Signature(s) Signed: 07/24/2020 6:21:54 PM By: Robson, Michael MD Entered By: Robson, Michael on 07/24/2020 15:44:36 -------------------------------------------------------------------------------- HxROS Details Patient Name: Date of Service: PEA RMA N, [name] 07/24/2020 1:15 PM Medical Record Number: [id number] Patient Account Number: [id number] Date of Birth/Sex: Treating RN: 07/15/1935 (84 y.o. Male) Breedlove, Lauren Primary Care Provider: Badger, Michael Other Clinician: Referring Provider: Treating Provider/Extender: Robson, Michael Badger, Michael Weeks in Treatment: 0 Information Obtained From Patient Constitutional Symptoms (General Health) Complaints and Symptoms: Negative for: Fatigue; Fever; Chills; Marked Weight Change Eyes Complaints and Symptoms: Negative for: Dry Eyes; Vision Changes; Glasses / Contacts Medical History: Negative for: Cataracts; Glaucoma; Optic Neuritis Ear/Nose/Mouth/Throat Complaints and Symptoms: Negative for: Chronic sinus problems or rhinitis Medical History: Negative for: Chronic sinus problems/congestion; Middle ear problems Respiratory Complaints and Symptoms: Negative for: Chronic or frequent coughs; Shortness of Breath Medical History: Positive for: Chronic Obstructive Pulmonary Disease (COPD) Negative for: Aspiration; Asthma; Pneumothorax; Sleep Apnea; Tuberculosis Past Medical History Notes: hx of emphysema, Cardiovascular Complaints and Symptoms: Negative for: Chest pain Medical History: Positive for: Arrhythmia - Afibb; Congestive Heart Failure; Hypertension Negative for: Angina; Coronary Artery Disease; Deep Vein Thrombosis; Hypotension; Myocardial Infarction; Peripheral Arterial Disease; Peripheral Venous Disease; Phlebitis; Vasculitis Past Medical History Notes: hx of cardiomyopathy, hyperlipidemia, pt. has a defibb. Gastrointestinal Complaints and Symptoms: Negative  for: Frequent diarrhea; Nausea; Vomiting Medical History: Negative for: Cirrhosis ; Colitis; Crohns; Hepatitis A; Hepatitis B; Hepatitis C Endocrine Complaints and Symptoms: Negative for: Heat/cold intolerance Medical History: Negative for: Type I Diabetes; Type II Diabetes Past Medical History Notes: hx of pancreatitis Genitourinary Complaints and Symptoms: Negative for: Frequent urination Medical History: Negative for: End Stage Renal Disease Musculoskeletal Complaints and Symptoms: Negative for: Muscle Pain; Muscle Weakness Medical History: Negative for: Gout; Rheumatoid Arthritis; Osteoarthritis; Osteomyelitis Neurologic Complaints and Symptoms: Negative for: Numbness/parasthesias Medical History: Negative for: Dementia; Neuropathy; Quadriplegia; Paraplegia; Seizure Disorder Psychiatric Complaints and Symptoms: Negative for: Claustrophobia; Suicidal Medical History: Negative for: Anorexia/bulimia; Confinement Anxiety Hematologic/Lymphatic Medical History: Negative for: Anemia; Hemophilia; Human Immunodeficiency Virus; Lymphedema; Sickle Cell Disease Immunological Medical History: Negative for: Lupus Erythematosus; Raynauds; Scleroderma Integumentary (Skin) Medical History: Negative for: History of Burn Oncologic Medical History: Negative for: Received Chemotherapy; Received Radiation Immunizations Pneumococcal Vaccine: Received Pneumococcal Vaccination: Yes Implantable Devices Yes Family and Social History Cancer: No; Diabetes: Yes - Father; Heart Disease: Yes - Father; Hereditary Spherocytosis: No; Hypertension: No; Kidney Disease: Yes; Lung Disease: No; Seizures: No; Stroke: No; Thyroid Problems: No; Tuberculosis: No; Former smoker; Marital Status - Divorced; Alcohol Use: Rarely; Drug Use: No History; Caffeine Use: Rarely; Financial Concerns: No; Food, Clothing or Shelter Needs: No; Support System Lacking: No; Transportation Concerns: No Electronic  Signature(s) Signed: 07/24/2020 6:21:54 PM By: Robson, Michael MD Signed: 07/25/2020 5:58:27 PM By: Breedlove, Lauren RN Entered By: Breedlove, Lauren on 07/24/2020 14:03:48 -------------------------------------------------------------------------------- SuperBill Details Patient Name: Date of Service: PEA RMA N, [name] 07/24/2020 Medical Record Number: [id number] Patient Account Number: [id number] Date of Birth/Sex: Treating RN: 05/03/1936 (84 y.o. Male) Lynch, Shatara Primary Care Provider: Badger, Michael Other Clinician: Referring Provider: Treating Provider/Extender: Robson, Michael Badger, Michael Weeks in Treatment: 0 Diagnosis Coding ICD-10 Codes Code Description L97.528 Non-pressure chronic ulcer of other part of left   foot with other specified severity I42.9 Cardiomyopathy, unspecified Facility Procedures CPT4 Code: [id number] Description: [facility] VISIT-LEV 3 EST PT Modifier: 25 Quantity: 1 CPT4 Code: [id number] Description: 40814 - DEB SUBQ TISSUE 20 SQ CM/< ICD-10 Diagnosis Description L97.528 Non-pressure chronic ulcer of other part of left foot with other specified severi Modifier: ty Quantity: 1 Physician Procedures : CPT4 Code Description Modifier [id number] - WC PHYS SUBQ TISS 20 SQ CM ICD-10 Diagnosis Description L97.528 Non-pressure chronic ulcer of other part of left foot with other specified severity Quantity: 1 Electronic Signature(s) Signed: 07/24/2020 5:56:44 PM By: Levan Hurst RN, BSN Signed: 07/24/2020 6:21:54 PM By: Linton Ham MD Entered By: Levan Hurst on 07/24/2020 17:22:15

## 2020-07-25 NOTE — Progress Notes (Signed)
[name], [name] ([id number]) Visit Report for 07/24/2020 Abuse/Suicide Risk Screen Details Patient Name: Date of Service: PEA RMA Dorris Carnes [name] 07/24/2020 1:15 PM Medical Record Number: [id number] Patient Account Number: [id number] Date of Birth/Sex: Treating RN: 1935-09-23 (85 y.o. Male) Fonnie Mu Primary Care Provider: Antony Haste Other Clinician: Referring Provider: Treating Provider/Extender: Murtis Sink in Treatment: 0 Abuse/Suicide Risk Screen Items Answer ABUSE RISK SCREEN: Has anyone close to you tried to hurt or harm you recentlyo No Do you feel uncomfortable with anyone in your familyo No Has anyone forced you do things that you didnt want to doo No Electronic Signature(s) Signed: 07/25/2020 5:58:27 PM By: Fonnie Mu RN Entered By: Fonnie Mu on 07/24/2020 13:57:34 -------------------------------------------------------------------------------- Activities of Daily Living Details Patient Name: Date of Service: PEA RMA N, [name] 07/24/2020 1:15 PM Medical Record Number: [id number] Patient Account Number: [id number] Date of Birth/Sex: Treating RN: 09-22-1935 (85 y.o. Male) Fonnie Mu Primary Care Provider: Antony Haste Other Clinician: Referring Provider: Treating Provider/Extender: Murtis Sink in Treatment: 0 Activities of Daily Living Items Answer Activities of Daily Living (Please select one for each item) Drive Automobile Completely Able T Medications ake Completely Able Use T elephone Completely Able Care for Appearance Completely Able Use T oilet Completely Able Bath / Shower Completely Able Dress Self Completely Able Feed Self Completely Able Walk Completely Able Get In / Out Bed Completely Able Housework Need Assistance Prepare Meals Need Assistance Handle Money Need Assistance Shop for Self Need Assistance Electronic Signature(s) Signed: 07/25/2020 5:58:27 PM By:  Fonnie Mu RN Entered By: Fonnie Mu on 07/24/2020 14:02:06 -------------------------------------------------------------------------------- Education Screening Details Patient Name: Date of Service: PEA RMA N, [name] 07/24/2020 1:15 PM Medical Record Number: [id number] Patient Account Number: [id number] Date of Birth/Sex: Treating RN: April 09, 1936 (85 y.o. Male) Fonnie Mu Primary Care Provider: Antony Haste Other Clinician: Referring Provider: Treating Provider/Extender: Murtis Sink in Treatment: 0 Primary Learner Assessed: Patient Learning Preferences/Education Level/Primary Language Learning Preference: Explanation, Demonstration, Communication Board, Printed Material Highest Education Level: High School Preferred Language: English Cognitive Barrier Language Barrier: No Translator Needed: No Memory Deficit: No Emotional Barrier: No Cultural/Religious Beliefs Affecting Medical Care: No Physical Barrier Impaired Vision: No Impaired Hearing: Yes Hearing Aid Decreased Hand dexterity: No Knowledge/Comprehension Knowledge Level: High Comprehension Level: High Ability to understand written instructions: High Ability to understand verbal instructions: High Motivation Anxiety Level: Calm Cooperation: Cooperative Education Importance: Denies Need Interest in Health Problems: Asks Questions Perception: Coherent Willingness to Engage in Self-Management High Activities: Readiness to Engage in Self-Management High Activities: Electronic Signature(s) Signed: 07/25/2020 5:58:27 PM By: Fonnie Mu RN Entered By: Fonnie Mu on 07/24/2020 14:03:13 -------------------------------------------------------------------------------- Fall Risk Assessment Details Patient Name: Date of Service: PEA RMA N, [name] 07/24/2020 1:15 PM Medical Record Number: [id number] Patient Account Number: [id number] Date of  Birth/Sex: Treating RN: February 16, 1936 (85 y.o. Male) Fonnie Mu Primary Care Provider: Antony Haste Other Clinician: Referring Provider: Treating Provider/Extender: Murtis Sink in Treatment: 0 Fall Risk Assessment Items Have you had 2 or more falls in the last 12 monthso 0 Yes Have you had any fall that resulted in injury in the last 12 monthso 0 No FALLS RISK SCREEN History of falling - immediate or within 3 months 0 No Secondary diagnosis (Do you have 2 or more medical diagnoseso) 0 No Ambulatory aid None/bed rest/wheelchair/nurse 0 No Crutches/cane/walker 0 No Furniture 0 No Intravenous therapy Access/Saline/Heparin Lock 0 No Gait/Transferring Normal/  bed rest/ wheelchair 0 No Weak (short steps with or without shuffle, stooped but able to lift head while walking, may seek 0 No support from furniture) Impaired (short steps with shuffle, may have difficulty arising from chair, head down, impaired 0 No balance) Mental Status Oriented to own ability 0 No Electronic Signature(s) Signed: 07/25/2020 5:58:27 PM By: Fonnie Mu RN Entered By: Fonnie Mu on 07/24/2020 14:07:38 -------------------------------------------------------------------------------- Foot Assessment Details Patient Name: Date of Service: PEA RMA N, [name] 07/24/2020 1:15 PM Medical Record Number: [id number] Patient Account Number: [id number] Date of Birth/Sex: Treating RN: 1935-10-05 (85 y.o. Male) Fonnie Mu Primary Care Provider: Antony Haste Other Clinician: Referring Provider: Treating Provider/Extender: Murtis Sink in Treatment: 0 Foot Assessment Items Site Locations + = Sensation present, - = Sensation absent, C = Callus, U = Ulcer R = Redness, W = Warmth, M = Maceration, PU = Pre-ulcerative lesion F = Fissure, S = Swelling, D = Dryness Assessment Right: Left: Other Deformity: No No Prior Foot Ulcer: No No Prior  Amputation: No No Charcot Joint: No No Ambulatory Status: Ambulatory Without Help Gait: Steady Electronic Signature(s) Signed: 07/25/2020 5:58:27 PM By: Fonnie Mu RN Entered By: Fonnie Mu on 07/24/2020 14:12:42 -------------------------------------------------------------------------------- Nutrition Risk Screening Details Patient Name: Date of Service: PEA RMA N, [name] 07/24/2020 1:15 PM Medical Record Number: [id number] Patient Account Number: [id number] Date of Birth/Sex: Treating RN: [birth year] (85 y.o. Male) Fonnie Mu Primary Care Provider: Antony Haste Other Clinician: Referring Provider: Treating Provider/Extender: Murtis Sink in Treatment: 0 Height (in): 75 Weight (lbs): 202 Body Mass Index (BMI): 25.2 Nutrition Risk Screening Items Score Screening NUTRITION RISK SCREEN: I have an illness or condition that made me change the kind and/or amount of food I eat 0 No I eat fewer than two meals per day 0 No I eat few fruits and vegetables, or milk products 0 No I have three or more drinks of beer, liquor or wine almost every day 0 No I have tooth or mouth problems that make it hard for me to eat 0 No I don't always have enough money to buy the food I need 0 No I eat alone most of the time 0 No I take three or more different prescribed or over-the-counter drugs a day 0 No Without wanting to, I have lost or gained 10 pounds in the last six months 0 No I am not always physically able to shop, cook and/or feed myself 0 No Nutrition Protocols Good Risk Protocol 0 No interventions needed Moderate Risk Protocol High Risk Proctocol Risk Level: Good Risk Score: 0 Electronic Signature(s) Signed: 07/25/2020 5:58:27 PM By: Fonnie Mu RN Entered By: Fonnie Mu on 07/24/2020 14:04:23

## 2020-07-25 NOTE — Progress Notes (Signed)
[name], BEAVER ([id number]) Visit Report for 07/24/2020 Allergy List Details Patient Name: Date of Service: PEA RMA Dorris Carnes DO Scott B. 07/24/2020 1:15 PM Medical Record Number: [id number] Patient Account Number: [id number] Date of Birth/Sex: Treating RN: [birth year] (84 y.o. Male) Fonnie Mu Primary Care Provider: Antony Haste Other Clinician: Referring Provider: Treating Provider/Extender: Murtis Sink in Treatment: 0 Allergies Active Allergies prednisolone glucosamine Allergy Notes Electronic Signature(s) Signed: 07/25/2020 5:58:27 PM By: Fonnie Mu RN Entered By: Fonnie Mu on 07/24/2020 14:04:08 -------------------------------------------------------------------------------- Arrival Information Details Patient Name: Date of Service: PEA RMA N, DO Scott B. 07/24/2020 1:15 PM Medical Record Number: [id number] Patient Account Number: [id number] Date of Birth/Sex: Treating RN: 1935-09-02 (84 y.o. Male) Fonnie Mu Primary Care Provider: Antony Haste Other Clinician: Referring Provider: Treating Provider/Extender: Murtis Sink in Treatment: 0 Visit Information Patient Arrived: Ambulatory Arrival Time: 13:50 Accompanied By: self Transfer Assistance: None Patient Identification Verified: Yes Secondary Verification Process Completed: Yes Patient Requires Transmission-Based Precautions: No Patient Has Alerts: No Electronic Signature(s) Signed: 07/25/2020 5:58:27 PM By: Fonnie Mu RN Entered By: Fonnie Mu on 07/24/2020 13:50:14 -------------------------------------------------------------------------------- Clinic Level of Care Assessment Details Patient Name: Date of Service: PEA RMA N, DO Scott B. 07/24/2020 1:15 PM Medical Record Number: [id number] Patient Account Number: [id number] Date of Birth/Sex: Treating RN: 1935/09/21 (84 y.o. Male) Zandra Abts Primary Care Provider: Antony Haste Other Clinician: Referring Provider: Treating Provider/Extender: Murtis Sink in Treatment: 0 Clinic Level of Care Assessment Items TOOL 1 Quantity Score X- 1 0 Use when EandM and Procedure is performed on INITIAL visit ASSESSMENTS - Nursing Assessment / Reassessment X- 1 20 General Physical Exam (combine w/ comprehensive assessment (listed just below) when performed on new pt. evals) X- 1 25 Comprehensive Assessment (HX, ROS, Risk Assessments, Wounds Hx, etc.) ASSESSMENTS - Wound and Skin Assessment / Reassessment []  - 0 Dermatologic / Skin Assessment (not related to wound area) ASSESSMENTS - Ostomy and/or Continence Assessment and Care []  - 0 Incontinence Assessment and Management []  - 0 Ostomy Care Assessment and Management (repouching, etc.) PROCESS - Coordination of Care X - Simple Patient / Family Education for ongoing care 1 15 []  - 0 Complex (extensive) Patient / Family Education for ongoing care X- 1 10 Staff obtains Chiropractor, Records, T Results / Process Orders est []  - 0 Staff telephones HHA, Nursing Homes / Clarify orders / etc []  - 0 Routine Transfer to another Facility (non-emergent condition) []  - 0 Routine Hospital Admission (non-emergent condition) X- 1 15 New Admissions / Manufacturing engineer / Ordering NPWT Apligraf, etc. , []  - 0 Emergency Hospital Admission (emergent condition) PROCESS - Special Needs []  - 0 Pediatric / Minor Patient Management []  - 0 Isolation Patient Management []  - 0 Hearing / Language / Visual special needs []  - 0 Assessment of Community assistance (transportation, D/C planning, etc.) []  - 0 Additional assistance / Altered mentation []  - 0 Support Surface(s) Assessment (bed, cushion, seat, etc.) INTERVENTIONS - Miscellaneous []  - 0 External ear exam []  - 0 Patient Transfer (multiple staff / Nurse, adult / Similar devices) []  - 0 Simple Staple / Suture removal (25 or less) []  -  0 Complex Staple / Suture removal (26 or more) []  - 0 Hypo/Hyperglycemic Management (do not check if billed separately) X- 1 15 Ankle / Brachial Index (ABI) - do not check if billed separately Has the patient been seen at the hospital within the last three years: Yes Total Score: 100 Level Of Care:  New/Established - Level 3 Electronic Signature(s) Signed: 07/24/2020 5:56:44 PM By: Zandra Abts RN, BSN Entered By: Zandra Abts on 07/24/2020 17:22:02 -------------------------------------------------------------------------------- Lower Extremity Assessment Details Patient Name: Date of Service: PEA RMA N, DO Scott B. 07/24/2020 1:15 PM Medical Record Number: [id number] Patient Account Number: [id number] Date of Birth/Sex: Treating RN: 05/19/1936 (84 y.o. Male) Fonnie Mu Primary Care Provider: Antony Haste Other Clinician: Referring Provider: Treating Provider/Extender: Murtis Sink in Treatment: 0 Edema Assessment Assessed: Kyra Searles: Yes] Franne Forts: No] Edema: [Left: N] [Right: o] Calf Left: Right: Point of Measurement: 40 cm From Medial Instep 34 cm Ankle Left: Right: Point of Measurement: 10 cm From Medial Instep 18 cm Knee To Floor Left: Right: From Medial Instep 46 cm Vascular Assessment Pulses: Dorsalis Pedis Palpable: [Left:Yes] Posterior Tibial Palpable: [Left:Yes] Blood Pressure: Brachial: [Left:129] Ankle: [Left:Dorsalis Pedis: 120 0.93] Electronic Signature(s) Signed: 07/25/2020 5:58:27 PM By: Fonnie Mu RN Entered By: Fonnie Mu on 07/24/2020 14:28:50 -------------------------------------------------------------------------------- Multi Wound Chart Details Patient Name: Date of Service: PEA RMA N, DO Scott B. 07/24/2020 1:15 PM Medical Record Number: [id number] Patient Account Number: [id number] Date of Birth/Sex: Treating RN: 07-Dec-1935 (84 y.o. Male) Zandra Abts Primary Care Provider: Antony Haste Other Clinician: Referring Provider: Treating Provider/Extender: Murtis Sink in Treatment: 0 Vital Signs Height(in): 75 Pulse(bpm): 71 Weight(lbs): 202 Blood Pressure(mmHg): 129/85 Body Mass Index(BMI): 25 Temperature(F): 98.1 Respiratory Rate(breaths/min): 17 Photos: [1:No Photos Left, Medial, Plantar Foot] [N/A:N/A N/A] Wound Location: [1:Gradually Appeared] [N/A:N/A] Wounding Event: [1:Pressure Ulcer] [N/A:N/A] Primary Etiology: [1:Chronic Obstructive Pulmonary] [N/A:N/A] Comorbid History: [1:Disease (COPD), Arrhythmia, Congestive Heart Failure, Hypertension 06/27/2018] [N/A:N/A] Date Acquired: [1:0] [N/A:N/A] Weeks of Treatment: [1:Open] [N/A:N/A] Wound Status: [1:0.4x0.2x0.3] [N/A:N/A] Measurements L x W x D (cm) [1:0.063] [N/A:N/A] A (cm) : rea [1:0.019] [N/A:N/A] Volume (cm) : [1:12] Starting Position 1 (o'clock): [1:12] Ending Position 1 (o'clock): [1:0.6] Maximum Distance 1 (cm): [1:Yes] [N/A:N/A] Undermining: [1:Category/Stage II] [N/A:N/A] Classification: [1:Medium] [N/A:N/A] Exudate A mount: [1:Serosanguineous] [N/A:N/A] Exudate Type: [1:red, brown] [N/A:N/A] Exudate Color: [1:Distinct, outline attached] [N/A:N/A] Wound Margin: [1:Large (67-100%)] [N/A:N/A] Granulation A mount: [1:Red, Pink] [N/A:N/A] Granulation Quality: [1:None Present (0%)] [N/A:N/A] Necrotic A mount: [1:Fascia: No] [N/A:N/A] Exposed Structures: [1:Fat Layer (Subcutaneous Tissue): No Tendon: No Muscle: No Joint: No Bone: No Small (1-33%)] [N/A:N/A] Epithelialization: [1:Debridement - Excisional] [N/A:N/A] Debridement: Pre-procedure Verification/Time Out 15:06 [N/A:N/A] Taken: [1:Callus, Subcutaneous] [N/A:N/A] Tissue Debrided: [1:Skin/Subcutaneous Tissue] [N/A:N/A] Level: [1:0.08] [N/A:N/A] Debridement A (sq cm): [1:rea Curette] [N/A:N/A] Instrument: [1:Minimum] [N/A:N/A] Bleeding: [1:Pressure] [N/A:N/A] Hemostasis A chieved: [1:0]  [N/A:N/A] Procedural Pain: [1:0] [N/A:N/A] Post Procedural Pain: [1:Procedure was tolerated well] [N/A:N/A] Debridement Treatment Response: [1:0.4x0.2x0.3] [N/A:N/A] Post Debridement Measurements L x W x D (cm) [1:0.019] [N/A:N/A] Post Debridement Volume: (cm) [1:Category/Stage II] [N/A:N/A] Post Debridement Stage: [1:Debridement] [N/A:N/A] Treatment Notes Electronic Signature(s) Signed: 07/24/2020 5:56:44 PM By: Zandra Abts RN, BSN Signed: 07/24/2020 6:21:54 PM By: Baltazar Najjar MD Entered By: Baltazar Najjar on 07/24/2020 15:38:18 -------------------------------------------------------------------------------- Multi-Disciplinary Care Plan Details Patient Name: Date of Service: PEA RMA N, DO Scott B. 07/24/2020 1:15 PM Medical Record Number: [id number] Patient Account Number: [id number] Date of Birth/Sex: Treating RN: Jun 24, 1935 (84 y.o. Male) Zandra Abts Primary Care Provider: Antony Haste Other Clinician: Referring Provider: Treating Provider/Extender: Murtis Sink in Treatment: 0 Multidisciplinary Care Plan reviewed with physician Active Inactive Abuse / Safety / Falls / Self Care Management Nursing Diagnoses: Potential for falls Potential for injury related to falls Goals: Patient will not experience any injury related to falls Date Initiated: 07/24/2020 Target Resolution  Date: 07/28/2020 Goal Status: Active Patient/caregiver will verbalize/demonstrate measures taken to prevent injury and/or falls Date Initiated: 07/24/2020 Target Resolution Date: 07/28/2020 Goal Status: Active Interventions: Assess Activities of Daily Living upon admission and as needed Assess fall risk on admission and as needed Assess: immobility, friction, shearing, incontinence upon admission and as needed Assess impairment of mobility on admission and as needed per policy Assess personal safety and home safety (as indicated) on admission and as needed Assess self  care needs on admission and as needed Provide education on fall prevention Provide education on personal and home safety Notes: Wound/Skin Impairment Nursing Diagnoses: Impaired tissue integrity Knowledge deficit related to ulceration/compromised skin integrity Goals: Patient/caregiver will verbalize understanding of skin care regimen Date Initiated: 07/24/2020 Target Resolution Date: 07/28/2020 Goal Status: Active Ulcer/skin breakdown will have a volume reduction of 30% by week 4 Date Initiated: 07/24/2020 Target Resolution Date: 07/28/2020 Goal Status: Active Interventions: Assess patient/caregiver ability to obtain necessary supplies Assess patient/caregiver ability to perform ulcer/skin care regimen upon admission and as needed Assess ulceration(s) every visit Provide education on ulcer and skin care Notes: Electronic Signature(s) Signed: 07/24/2020 5:56:44 PM By: Zandra Abts RN, BSN Entered By: Zandra Abts on 07/24/2020 15:04:56 -------------------------------------------------------------------------------- Pain Assessment Details Patient Name: Date of Service: PEA RMA N, DO Scott B. 07/24/2020 1:15 PM Medical Record Number: [id number] Patient Account Number: [id number] Date of Birth/Sex: Treating RN: 03/01/1936 (84 y.o. Male) Fonnie Mu Primary Care Provider: Antony Haste Other Clinician: Referring Provider: Treating Provider/Extender: Murtis Sink in Treatment: 0 Active Problems Location of Pain Severity and Description of Pain Patient Has Paino No Patient Has Paino No Site Locations Pain Management and Medication Current Pain Management: Electronic Signature(s) Signed: 07/25/2020 5:58:27 PM By: Fonnie Mu RN Entered By: Fonnie Mu on 07/24/2020 14:04:35 -------------------------------------------------------------------------------- Patient/Caregiver Education Details Patient Name: Date of Service: PEA RMA N, DO  Scott B. 2/28/2022andnbsp1:15 PM Medical Record Number: [id number] Patient Account Number: [id number] Date of Birth/Gender: Treating RN: 1936-03-15 (84 y.o. Male) Zandra Abts Primary Care Physician: Antony Haste Other Clinician: Referring Physician: Treating Physician/Extender: Murtis Sink in Treatment: 0 Education Assessment Education Provided To: Patient Education Topics Provided Offloading: Methods: Explain/Verbal Responses: State content correctly Safety: Methods: Explain/Verbal Responses: State content correctly Wound/Skin Impairment: Methods: Explain/Verbal Responses: State content correctly Electronic Signature(s) Signed: 07/24/2020 5:56:44 PM By: Zandra Abts RN, BSN Entered By: Zandra Abts on 07/24/2020 15:05:17 -------------------------------------------------------------------------------- Wound Assessment Details Patient Name: Date of Service: PEA RMA N, DO Scott B. 07/24/2020 1:15 PM Medical Record Number: [id number] Patient Account Number: [id number] Date of Birth/Sex: Treating RN: 1935/06/01 (84 y.o. Male) Fonnie Mu Primary Care Provider: Antony Haste Other Clinician: Referring Provider: Treating Provider/Extender: Murtis Sink in Treatment: 0 Wound Status Wound Number: 1 Primary Pressure Ulcer Etiology: Wound Location: Left, Medial, Plantar Foot Wound Open Wounding Event: Gradually Appeared Status: Date Acquired: 06/27/2018 Comorbid Chronic Obstructive Pulmonary Disease (COPD), Arrhythmia, Weeks Of Treatment: 0 History: Congestive Heart Failure, Hypertension Clustered Wound: No Photos Wound Measurements Length: (cm) 0.4 Width: (cm) 0.2 Depth: (cm) 0.3 Area: (cm) 0.063 Volume: (cm) 0.019 % Reduction in Area: 0% % Reduction in Volume: 0% Epithelialization: Small (1-33%) Tunneling: No Undermining: Yes Starting Position (o'clock): 12 Ending Position (o'clock): 12 Maximum  Distance: (cm) 0.6 Wound Description Classification: Category/Stage II Wound Margin: Distinct, outline attached Exudate Amount: Medium Exudate Type: Serosanguineous Exudate Color: red, brown Foul Odor After Cleansing: No Slough/Fibrino No Wound Bed Granulation Amount: Large (67-100%) Exposed Structure Granulation Quality: Red, Pink Fascia  Exposed: No Necrotic Amount: None Present (0%) Fat Layer (Subcutaneous Tissue) Exposed: No Tendon Exposed: No Muscle Exposed: No Joint Exposed: No Bone Exposed: No Electronic Signature(s) Signed: 07/25/2020 8:29:17 AM By: Karl Ito Signed: 07/25/2020 5:58:27 PM By: Fonnie Mu RN Entered By: Karl Ito on 07/24/2020 16:48:05 -------------------------------------------------------------------------------- Vitals Details Patient Name: Date of Service: PEA RMA N, DO Scott B. 07/24/2020 1:15 PM Medical Record Number: [id number] Patient Account Number: [id number] Date of Birth/Sex: Treating RN: 1936-02-15 (85 y.o. Male) Fonnie Mu Primary Care Provider: Antony Haste Other Clinician: Referring Provider: Treating Provider/Extender: Murtis Sink in Treatment: 0 Vital Signs Time Taken: 13:50 Temperature (F): 98.1 Height (in): 75 Pulse (bpm): 71 Source: Stated Respiratory Rate (breaths/min): 17 Weight (lbs): 202 Blood Pressure (mmHg): 129/85 Source: Stated Reference Range: 80 - 120 mg / dl Body Mass Index (BMI): 25.2 Electronic Signature(s) Signed: 07/25/2020 5:58:27 PM By: Fonnie Mu RN Entered By: Fonnie Mu on 07/24/2020 13:50:54

## 2020-07-31 ENCOUNTER — Encounter (HOSPITAL_BASED_OUTPATIENT_CLINIC_OR_DEPARTMENT_OTHER): Payer: Medicare HMO | Admitting: Physician Assistant

## 2020-08-23 ENCOUNTER — Other Ambulatory Visit: Payer: Self-pay

## 2020-08-23 ENCOUNTER — Encounter (HOSPITAL_BASED_OUTPATIENT_CLINIC_OR_DEPARTMENT_OTHER): Payer: Medicare HMO | Attending: Physician Assistant | Admitting: Physician Assistant

## 2020-08-23 DIAGNOSIS — L84 Corns and callosities: Secondary | ICD-10-CM | POA: Insufficient documentation

## 2020-08-23 DIAGNOSIS — I110 Hypertensive heart disease with heart failure: Secondary | ICD-10-CM | POA: Diagnosis not present

## 2020-08-23 DIAGNOSIS — I509 Heart failure, unspecified: Secondary | ICD-10-CM | POA: Diagnosis not present

## 2020-08-23 DIAGNOSIS — I482 Chronic atrial fibrillation, unspecified: Secondary | ICD-10-CM | POA: Diagnosis not present

## 2020-08-23 DIAGNOSIS — Z95810 Presence of automatic (implantable) cardiac defibrillator: Secondary | ICD-10-CM | POA: Insufficient documentation

## 2020-08-23 DIAGNOSIS — I429 Cardiomyopathy, unspecified: Secondary | ICD-10-CM | POA: Insufficient documentation

## 2020-08-23 DIAGNOSIS — J449 Chronic obstructive pulmonary disease, unspecified: Secondary | ICD-10-CM | POA: Diagnosis not present

## 2020-08-23 DIAGNOSIS — L97528 Non-pressure chronic ulcer of other part of left foot with other specified severity: Secondary | ICD-10-CM | POA: Insufficient documentation

## 2020-08-23 NOTE — Progress Notes (Addendum)
REDMOND, WHITTLEY ([id number]) Visit Report for 08/23/2020 Chief Complaint Document Details Patient Name: Date of Service: PEA RMA N, [name] 08/23/2020 11:00 A M Medical Record Number: [id number] Patient Account Number: [id number] Date of Birth/Sex: Treating RN: 1936/02/03 (85 y.o. Damaris Schooner Primary Care Provider: Antony Haste Other Clinician: Referring Provider: Treating Provider/Extender: Nanetta Batty in Treatment: 4 Information Obtained from: Patient Chief Complaint 07/24/2020; patient is here for review of wound on his left mid plantar foot Electronic Signature(s) Signed: 08/23/2020 11:25:07 AM By: Lenda Kelp PA-C Entered By: Lenda Kelp on 08/23/2020 11:25:07 -------------------------------------------------------------------------------- Debridement Details Patient Name: Date of Service: PEA RMA N, [name] 08/23/2020 11:00 A M Medical Record Number: [id number] Patient Account Number: [id number] Date of Birth/Sex: Treating RN: Sep 16, 1935 (85 y.o. Damaris Schooner Primary Care Provider: Antony Haste Other Clinician: Referring Provider: Treating Provider/Extender: Nanetta Batty in Treatment: 4 Debridement Performed for Assessment: Wound #1 Left,Medial,Plantar Foot Performed By: Physician Lenda Kelp, PA Debridement Type: Debridement Level of Consciousness (Pre-procedure): Awake and Alert Pre-procedure Verification/Time Out Yes - 12:00 Taken: Start Time: 12:00 Pain Control: Lidocaine 4% T opical Solution T Area Debrided (L x W): otal 1 (cm) x 1 (cm) = 1 (cm) Tissue and other material debrided: Viable, Non-Viable, Callus, Slough, Subcutaneous, Skin: Epidermis, Slough Level: Skin/Subcutaneous Tissue Debridement Description: Excisional Instrument: Curette Bleeding: Minimum Hemostasis Achieved: Pressure End Time: 12:06 Procedural Pain: 0 Post Procedural Pain: 0 Response to Treatment:  Procedure was tolerated well Level of Consciousness (Post- Awake and Alert procedure): Post Debridement Measurements of Total Wound Length: (cm) 0.3 Stage: Category/Stage II Width: (cm) 0.3 Depth: (cm) 0.1 Volume: (cm) 0.007 Character of Wound/Ulcer Post Debridement: Improved Post Procedure Diagnosis Same as Pre-procedure Electronic Signature(s) Signed: 08/23/2020 5:20:30 PM By: Lenda Kelp PA-C Signed: 08/23/2020 5:50:20 PM By: Zenaida Deed RN, BSN Entered By: Zenaida Deed on 08/23/2020 12:07:00 -------------------------------------------------------------------------------- HPI Details Patient Name: Date of Service: PEA RMA N, [name] 08/23/2020 11:00 A M Medical Record Number: [id number] Patient Account Number: [id number] Date of Birth/Sex: Treating RN: [birth year] (85 y.o. Damaris Schooner Primary Care Provider: Antony Haste Other Clinician: Referring Provider: Treating Provider/Extender: Nanetta Batty in Treatment: 4 History of Present Illness HPI Description: ADMISSION 07/24/2020 This is a 85 year old man who has had a wound on his left medial mid plantar foot for 2 years. He states this started as a blister. He saw Dr. Karie Schwalbe a local [facility] podiatrist and then subsequently was referred to Dr. Logan Bores in November 2019 noted he had an ulcer of the left foot and x-ray showed navicular exostosis. There was some discussion about surgery at that time however the wound healed within a month and noting his significant cardiac issues no further surgical work-up was entertained. The patient has currently been putting gentamicin on the wound he has an insole. Past medical history includes heart failure with reduced ejection fraction, chronic atrial fibrillation, ICD placement, hypertension, hypercholesterolemia, COPD ABI in our clinic was 1.1 on the left 08/23/2020 upon evaluation today patient appears to be doing poorly in regard to his plantar foot wound.  There is a lot of callus around the edges. We have not seen him for a month but this is secondary to the fact that he was actually having to be seen at [facility] and have a pacemaker reimplanted it sounds like. Nonetheless obviously that was important he has been taking care of this at home on  his own. He is not used any of the supplies we gave him he has not use the shoe that we gave him and he brings all those back in here today infected supplies were ordered for him is still in a completely unopened box. Nonetheless he tells me that he has been using a "cream" that he got from "Dr. Karie Schwalbe Again I am really not sure what exactly this is that he is using but nonetheless I am om" not certain that there is any cream that is really going to do the job here as far as getting this under control. Nonetheless I would like to see what it is that he is using before we have that discussion that is. Electronic Signature(s) Signed: 08/23/2020 1:09:50 PM By: Lenda Kelp PA-C Entered By: Lenda Kelp on 08/23/2020 13:09:49 -------------------------------------------------------------------------------- Physical Exam Details Patient Name: Date of Service: PEA RMA N, [name] 08/23/2020 11:00 A M Medical Record Number: [id number] Patient Account Number: [id number] Date of Birth/Sex: Treating RN: 10-Jun-1935 (85 y.o. Damaris Schooner Primary Care Provider: Antony Haste Other Clinician: Referring Provider: Treating Provider/Extender: Nanetta Batty in Treatment: 4 Constitutional Well-nourished and well-hydrated in no acute distress. Respiratory normal breathing without difficulty. Psychiatric this patient is able to make decisions and demonstrates good insight into disease process. Alert and Oriented x 3. pleasant and cooperative. Notes Upon inspection patient's wound did require sharp debridement to remove callus from the surface of the wound including some slough and  subcutaneous tissue down to good level. He tolerated all this today without complication post debridement wound bed appears to be doing much better which is great news. No fevers, chills, nausea, vomiting, or diarrhea. Electronic Signature(s) Signed: 08/23/2020 1:10:25 PM By: Lenda Kelp PA-C Entered By: Lenda Kelp on 08/23/2020 13:10:25 -------------------------------------------------------------------------------- Physician Orders Details Patient Name: Date of Service: PEA RMA N, [name] 08/23/2020 11:00 A M Medical Record Number: [id number] Patient Account Number: [id number] Date of Birth/Sex: Treating RN: 1936-03-10 (85 y.o. Damaris Schooner Primary Care Provider: Antony Haste Other Clinician: Referring Provider: Treating Provider/Extender: Nanetta Batty in Treatment: 4 Verbal / Phone Orders: No Diagnosis Coding ICD-10 Coding Code Description [phone] Non-pressure chronic ulcer of other part of left foot with other specified severity I42.9 Cardiomyopathy, unspecified Follow-up Appointments Return Appointment in 1 week. Bathing/ Shower/ Hygiene May shower and wash wound with soap and water. - on days that dressing is changed Wound Treatment Wound #1 - Foot Wound Laterality: Plantar, Left, Medial Cleanser: Soap and Water Every Other Day/7 Days Discharge Instructions: May shower and wash wound with dial antibacterial soap and water prior to dressing change. Prim Dressing: KerraCel Ag Gelling Fiber Dressing, 2x2 in (silver alginate) (Generic) Every Other Day/7 Days ary Discharge Instructions: Apply silver alginate to wound bed as instructed Secondary Dressing: Optifoam Non-Adhesive Dressing, 4x4 in Every Other Day/7 Days Discharge Instructions: foam donut callous pad Secondary Dressing: ComfortFoam Border, 3x3 in (silicone border) Every Other Day/7 Days Discharge Instructions: or bandaid Apply over primary dressing as directed. Electronic  Signature(s) Signed: 08/23/2020 5:20:30 PM By: Lenda Kelp PA-C Signed: 08/23/2020 5:50:20 PM By: Zenaida Deed RN, BSN Entered By: Zenaida Deed on 08/23/2020 12:13:21 -------------------------------------------------------------------------------- Problem List Details Patient Name: Date of Service: PEA RMA N, [name] 08/23/2020 11:00 A M Medical Record Number: [id number] Patient Account Number: [id number] Date of Birth/Sex: Treating RN: 12-13-1935 (85 y.o. Damaris Schooner Primary Care Provider: Other Clinician: Antony Haste  Referring Provider: Treating Provider/Extender: Nanetta Batty in Treatment: 4 Active Problems ICD-10 Encounter Code Description Active Date MDM Diagnosis L97.528 Non-pressure chronic ulcer of other part of left foot with other specified 07/24/2020 No Yes severity I42.9 Cardiomyopathy, unspecified 07/24/2020 No Yes Inactive Problems Resolved Problems Electronic Signature(s) Signed: 08/23/2020 11:24:56 AM By: Lenda Kelp PA-C Entered By: Lenda Kelp on 08/23/2020 11:24:56 -------------------------------------------------------------------------------- Progress Note Details Patient Name: Date of Service: PEA RMA N, [name] 08/23/2020 11:00 A M Medical Record Number: [id number] Patient Account Number: [id number] Date of Birth/Sex: Treating RN: June 26, 1935 (85 y.o. Damaris Schooner Primary Care Provider: Antony Haste Other Clinician: Referring Provider: Treating Provider/Extender: Nanetta Batty in Treatment: 4 Subjective Chief Complaint Information obtained from Patient 07/24/2020; patient is here for review of wound on his left mid plantar foot History of Present Illness (HPI) ADMISSION 07/24/2020 This is a 85 year old man who has had a wound on his left medial mid plantar foot for 2 years. He states this started as a blister. He saw Dr. Karie Schwalbe a local [facility] podiatrist and then  subsequently was referred to Dr. Logan Bores in November 2019 noted he had an ulcer of the left foot and x-ray showed navicular exostosis. There was some discussion about surgery at that time however the wound healed within a month and noting his significant cardiac issues no further surgical work-up was entertained. The patient has currently been putting gentamicin on the wound he has an insole. Past medical history includes heart failure with reduced ejection fraction, chronic atrial fibrillation, ICD placement, hypertension, hypercholesterolemia, COPD ABI in our clinic was 1.1 on the left 08/23/2020 upon evaluation today patient appears to be doing poorly in regard to his plantar foot wound. There is a lot of callus around the edges. We have not seen him for a month but this is secondary to the fact that he was actually having to be seen at [facility] and have a pacemaker reimplanted it sounds like. Nonetheless obviously that was important he has been taking care of this at home on his own. He is not used any of the supplies we gave him he has not use the shoe that we gave him and he brings all those back in here today infected supplies were ordered for him is still in a completely unopened box. Nonetheless he tells me that he has been using a "cream" that he got from "Dr. Karie Schwalbe Again I am really not sure what exactly this is that he is using but nonetheless I am om" not certain that there is any cream that is really going to do the job here as far as getting this under control. Nonetheless I would like to see what it is that he is using before we have that discussion that is. Objective Constitutional Well-nourished and well-hydrated in no acute distress. Vitals Time Taken: 11:43 AM, Height: 75 in, Weight: 202 lbs, BMI: 25.2, Temperature: 98.0 F, Pulse: 82 bpm, Respiratory Rate: 17 breaths/min, Blood Pressure: 121/73 mmHg. Respiratory normal breathing without difficulty. Psychiatric this patient is able  to make decisions and demonstrates good insight into disease process. Alert and Oriented x 3. pleasant and cooperative. General Notes: Upon inspection patient's wound did require sharp debridement to remove callus from the surface of the wound including some slough and subcutaneous tissue down to good level. He tolerated all this today without complication post debridement wound bed appears to be doing much better which is great news. No fevers,  chills, nausea, vomiting, or diarrhea. Integumentary (Hair, Skin) Wound #1 status is Open. Original cause of wound was Gradually Appeared. The date acquired was: 06/27/2018. The wound has been in treatment 4 weeks. The wound is located on the Left,Medial,Plantar Foot. The wound measures 0.2cm length x 0.2cm width x 0.2cm depth; 0.031cm^2 area and 0.006cm^3 volume. There is Fat Layer (Subcutaneous Tissue) exposed. There is no tunneling or undermining noted. There is a medium amount of serosanguineous drainage noted. The wound margin is distinct with the outline attached to the wound base. There is large (67-100%) pink granulation within the wound bed. There is no necrotic tissue within the wound bed. Assessment Active Problems ICD-10 Non-pressure chronic ulcer of other part of left foot with other specified severity Cardiomyopathy, unspecified Procedures Wound #1 Pre-procedure diagnosis of Wound #1 is a Pressure Ulcer located on the Left,Medial,Plantar Foot . There was a Excisional Skin/Subcutaneous Tissue Debridement with a total area of 1 sq cm performed by Lenda Kelp, PA. With the following instrument(s): Curette to remove Viable and Non-Viable tissue/material. Material removed includes Callus, Subcutaneous Tissue, Slough, and Skin: Epidermis after achieving pain control using Lidocaine 4% Topical Solution. No specimens were taken. A time out was conducted at 12:00, prior to the start of the procedure. A Minimum amount of bleeding was controlled  with Pressure. The procedure was tolerated well with a pain level of 0 throughout and a pain level of 0 following the procedure. Post Debridement Measurements: 0.3cm length x 0.3cm width x 0.1cm depth; 0.007cm^3 volume. Post debridement Stage noted as Category/Stage II. Character of Wound/Ulcer Post Debridement is improved. Post procedure Diagnosis Wound #1: Same as Pre-Procedure Plan Follow-up Appointments: Return Appointment in 1 week. Bathing/ Shower/ Hygiene: May shower and wash wound with soap and water. - on days that dressing is changed WOUND #1: - Foot Wound Laterality: Plantar, Left, Medial Cleanser: Soap and Water Every Other Day/7 Days Discharge Instructions: May shower and wash wound with dial antibacterial soap and water prior to dressing change. Prim Dressing: KerraCel Ag Gelling Fiber Dressing, 2x2 in (silver alginate) (Generic) Every Other Day/7 Days ary Discharge Instructions: Apply silver alginate to wound bed as instructed Secondary Dressing: Optifoam Non-Adhesive Dressing, 4x4 in Every Other Day/7 Days Discharge Instructions: foam donut callous pad Secondary Dressing: ComfortFoam Border, 3x3 in (silicone border) Every Other Day/7 Days Discharge Instructions: or bandaid Apply over primary dressing as directed. 1. Would recommend currently that we going to continue with the wound care measures as before using the silver alginate dressing I think this is still best. 2. I am also can recommend that we use a border foam dressing to cover. 3. I would also suggest the patient should be using the postop offloading shoe to try to keep pressure off of the plantar foot area. Obviously I am not certain that his current shoe is the best way to go as it seems like it is rubbing in the area to this inside of his foot medially. We will see patient back for reevaluation in 1 week here in the clinic. If anything worsens or changes patient will contact our office for  additional recommendations. I did advise that he should bring the cream with him next week so I can see what that is and we can advise as to what he should or should not do with that. Electronic Signature(s) Signed: 08/23/2020 1:11:39 PM By: Lenda Kelp PA-C Entered By: Lenda Kelp on 08/23/2020 13:11:39 -------------------------------------------------------------------------------- SuperBill Details Patient Name: Date of Service:  PEA RMA N, [name] 08/23/2020 Medical Record Number: [id number] Patient Account Number: [id number] Date of Birth/Sex: Treating RN: July 15, 1935 (85 y.o. Damaris SchoonerM) Boehlein, Linda Primary Care Provider: Antony HasteBadger, Michael Other Clinician: Referring Provider: Treating Provider/Extender: Nanetta BattyStone III, Goebel Hellums Badger, Michael Weeks in Treatment: 4 Diagnosis Coding ICD-10 Codes Code Description [phone] Non-pressure chronic ulcer of other part of left foot with other specified severity I42.9 Cardiomyopathy, unspecified Facility Procedures CPT4 Code: [id number] Description: 11042 - DEB SUBQ TISSUE 20 SQ CM/< ICD-10 Diagnosis Description L97.528 Non-pressure chronic ulcer of other part of left foot with other specified seve Modifier: rity Quantity: 1 Physician Procedures : CPT4 Code Description Modifier [id number] 11042 - WC PHYS SUBQ TISS 20 SQ CM ICD-10 Diagnosis Description L97.528 Non-pressure chronic ulcer of other part of left foot with other specified severity Quantity: 1 Electronic Signature(s) Signed: 08/23/2020 1:11:51 PM By: Lenda KelpStone III, Ladeja Pelham PA-C Entered By: Lenda KelpStone III, Roldan Laforest on 08/23/2020 13:11:51

## 2020-08-24 NOTE — Progress Notes (Signed)
[name], [name] ([id number]) Visit Report for 08/23/2020 Arrival Information Details Patient Name: Date of Service: PEA RMA N, DO Scott B. 08/23/2020 11:00 A M Medical Record Number: [id number] Patient Account Number: [id number] Date of Birth/Sex: Treating RN: Mar 27, 1936 (85 y.o. [name], [name] Primary Care Starsha Morning: Anastasia Pall Other Clinician: Referring Keil Pickering: Treating Amarien Carne/Extender: Cena Benton in Treatment: 4 Visit Information History Since Last Visit Added or deleted any medications: No Patient Arrived: Ambulatory Any new allergies or adverse reactions: No Arrival Time: 11:41 Had a fall or experienced change in No Accompanied By: wife activities of daily living that may affect Transfer Assistance: None risk of falls: Patient Identification Verified: Yes Signs or symptoms of abuse/neglect since last visito No Secondary Verification Process Completed: Yes Hospitalized since last visit: No Patient Requires Transmission-Based Precautions: No Implantable device outside of the clinic excluding No Patient Has Alerts: No cellular tissue based products placed in the center since last visit: Has Dressing in Place as Prescribed: Yes Pain Present Now: No Electronic Signature(s) Signed: 08/23/2020 2:30:46 PM By: Sandre Kitty Entered By: Sandre Kitty on 08/23/2020 11:43:31 -------------------------------------------------------------------------------- Encounter Discharge Information Details Patient Name: Date of Service: PEA RMA N, DO Scott B. 08/23/2020 11:00 A M Medical Record Number: [id number] Patient Account Number: [id number] Date of Birth/Sex: Treating RN: 1935-08-18 (85 y.o. [name], [name] Primary Care Suly Vukelich: Anastasia Pall Other Clinician: Referring Keylin Podolsky: Treating Taliesin Hartlage/Extender: Cena Benton in Treatment: 4 Encounter Discharge Information Items Post Procedure Vitals Discharge  Condition: Stable Temperature (F): 98 Ambulatory Status: Ambulatory Pulse (bpm): 84 Discharge Destination: Home Respiratory Rate (breaths/min): 17 Transportation: Private Auto Blood Pressure (mmHg): 124/74 Accompanied By: self Schedule Follow-up Appointment: Yes Clinical Summary of Care: Patient Declined Electronic Signature(s) Signed: 08/23/2020 5:43:14 PM By: Rhae Hammock RN Entered By: Rhae Hammock on 08/23/2020 12:12:22 -------------------------------------------------------------------------------- Lower Extremity Assessment Details Patient Name: Date of Service: PEA RMA N, DO Scott B. 08/23/2020 11:00 A M Medical Record Number: [id number] Patient Account Number: [id number] Date of Birth/Sex: Treating RN: Sep 21, 1935 (85 y.o. [name] Primary Care Moria Brophy: Anastasia Pall Other Clinician: Referring Kajuana Shareef: Treating Jenna Ardoin/Extender: Cena Benton in Treatment: 4 Edema Assessment Assessed: Shirlyn Goltz: No] [name]: No] Edema: [Left: N] [Right: o] Calf Left: Right: Point of Measurement: 40 cm From Medial Instep 34 cm Ankle Left: Right: Point of Measurement: 10 cm From Medial Instep 21 cm Vascular Assessment Pulses: Dorsalis Pedis Palpable: [Left:Yes] Electronic Signature(s) Signed: 08/24/2020 5:46:28 PM By: Levan Hurst RN, BSN Entered By: Levan Hurst on 08/23/2020 11:54:07 -------------------------------------------------------------------------------- Multi-Disciplinary Care Plan Details Patient Name: Date of Service: PEA RMA N, DO Scott B. 08/23/2020 11:00 A M Medical Record Number: [id number] Patient Account Number: [id number] Date of Birth/Sex: Treating RN: 1936/04/09 (85 y.o. [name] Primary Care Jenniefer Salak: Anastasia Pall Other Clinician: Referring Princes Finger: Treating Dara Beidleman/Extender: Cena Benton in Treatment: 4 Multidisciplinary Care Plan reviewed with physician Active  Inactive Abuse / Safety / Falls / Self Care Management Nursing Diagnoses: Potential for falls Potential for injury related to falls Goals: Patient will not experience any injury related to falls Date Initiated: 07/24/2020 Target Resolution Date: 09/20/2020 Goal Status: Active Patient/caregiver will verbalize/demonstrate measures taken to prevent injury and/or falls Date Initiated: 07/24/2020 Target Resolution Date: 09/20/2020 Goal Status: Active Interventions: Assess Activities of Daily Living upon admission and as needed Assess fall risk on admission and as needed Assess: immobility, friction, shearing, incontinence upon admission and as needed Assess impairment of mobility on admission  and as needed per policy Assess personal safety and home safety (as indicated) on admission and as needed Assess self care needs on admission and as needed Provide education on fall prevention Provide education on personal and home safety Notes: Wound/Skin Impairment Nursing Diagnoses: Impaired tissue integrity Knowledge deficit related to ulceration/compromised skin integrity Goals: Patient/caregiver will verbalize understanding of skin care regimen Date Initiated: 07/24/2020 Target Resolution Date: 09/20/2020 Goal Status: Active Ulcer/skin breakdown will have a volume reduction of 30% by week 4 Date Initiated: 07/24/2020 Date Inactivated: 08/23/2020 Target Resolution Date: 07/28/2020 Goal Status: Met Ulcer/skin breakdown will have a volume reduction of 80% by week 12 Date Initiated: 08/23/2020 Target Resolution Date: 09/20/2020 Goal Status: Active Interventions: Assess patient/caregiver ability to obtain necessary supplies Assess patient/caregiver ability to perform ulcer/skin care regimen upon admission and as needed Assess ulceration(s) every visit Provide education on ulcer and skin care Notes: Electronic Signature(s) Signed: 08/23/2020 5:50:20 PM By: Baruch Gouty RN, BSN Entered By:  Baruch Gouty on 08/23/2020 12:04:23 -------------------------------------------------------------------------------- Pain Assessment Details Patient Name: Date of Service: PEA RMA N, DO Scott B. 08/23/2020 11:00 A M Medical Record Number: [id number] Patient Account Number: [id number] Date of Birth/Sex: Treating RN: 1935/07/01 (85 y.o. [name] Primary Care Nahomi Hegner: Anastasia Pall Other Clinician: Referring Lonisha Bobby: Treating Rickeya Manus/Extender: Cena Benton in Treatment: 4 Active Problems Location of Pain Severity and Description of Pain Patient Has Paino No Site Locations Pain Management and Medication Current Pain Management: Electronic Signature(s) Signed: 08/23/2020 2:30:46 PM By: Sandre Kitty Signed: 08/23/2020 5:50:20 PM By: Baruch Gouty RN, BSN Entered By: Sandre Kitty on 08/23/2020 11:44:03 -------------------------------------------------------------------------------- Patient/Caregiver Education Details Patient Name: Date of Service: PEA RMA N, DO Scott B. 3/30/2022andnbsp11:00 A M Medical Record Number: [id number] Patient Account Number: [id number] Date of Birth/Gender: Treating RN: [birth year] (85 y.o. [name] Primary Care Physician: Anastasia Pall Other Clinician: Referring Physician: Treating Physician/Extender: Cena Benton in Treatment: 4 Education Assessment Education Provided To: Patient Education Topics Provided Offloading: Methods: Explain/Verbal Responses: Reinforcements needed, State content correctly Wound/Skin Impairment: Methods: Explain/Verbal Responses: Reinforcements needed, State content correctly Electronic Signature(s) Signed: 08/23/2020 5:50:20 PM By: Baruch Gouty RN, BSN Entered By: Baruch Gouty on 08/23/2020 12:05:09 -------------------------------------------------------------------------------- Wound Assessment Details Patient Name: Date of  Service: PEA RMA N, DO Scott B. 08/23/2020 11:00 A M Medical Record Number: [id number] Patient Account Number: [id number] Date of Birth/Sex: Treating RN: Feb 22, 1936 (85 y.o. [name] Primary Care Geovonni Meyerhoff: Anastasia Pall Other Clinician: Referring Elisabetta Mishra: Treating Donya Tomaro/Extender: Cena Benton in Treatment: 4 Wound Status Wound Number: 1 Primary Pressure Ulcer Etiology: Wound Location: Left, Medial, Plantar Foot Wound Open Wounding Event: Gradually Appeared Status: Date Acquired: 06/27/2018 Comorbid Chronic Obstructive Pulmonary Disease (COPD), Arrhythmia, Weeks Of Treatment: 4 History: Congestive Heart Failure, Hypertension Clustered Wound: No Photos Wound Measurements Length: (cm) 0.2 Width: (cm) 0.2 Depth: (cm) 0.2 Area: (cm) 0.031 Volume: (cm) 0.006 % Reduction in Area: 50.8% % Reduction in Volume: 68.4% Epithelialization: Large (67-100%) Tunneling: No Undermining: No Wound Description Classification: Category/Stage II Wound Margin: Distinct, outline attached Exudate Amount: Medium Exudate Type: Serosanguineous Exudate Color: red, brown Foul Odor After Cleansing: No Slough/Fibrino No Wound Bed Granulation Amount: Large (67-100%) Exposed Structure Granulation Quality: Pink Fascia Exposed: No Necrotic Amount: None Present (0%) Fat Layer (Subcutaneous Tissue) Exposed: Yes Tendon Exposed: No Muscle Exposed: No Joint Exposed: No Bone Exposed: No Treatment Notes Wound #1 (Foot) Wound Laterality: Plantar, Left, Medial Cleanser Soap and Water Discharge Instruction: May  shower and wash wound with dial antibacterial soap and water prior to dressing change. Peri-Wound Care Topical Primary Dressing KerraCel Ag Gelling Fiber Dressing, 2x2 in (silver alginate) Discharge Instruction: Apply silver alginate to wound bed as instructed Secondary Dressing ComfortFoam Border, 3x3 in (silicone border) Discharge Instruction: or  bandaid Apply over primary dressing as directed. Secured With Compression Wrap Compression Stockings Environmental education officer) Signed: 08/23/2020 5:25:01 PM By: Sandre Kitty Signed: 08/23/2020 5:50:20 PM By: Baruch Gouty RN, BSN Entered By: Sandre Kitty on 08/23/2020 16:51:20 -------------------------------------------------------------------------------- Vitals Details Patient Name: Date of Service: PEA RMA N, DO Scott B. 08/23/2020 11:00 A M Medical Record Number: [id number] Patient Account Number: [id number] Date of Birth/Sex: Treating RN: 10/22/1935 (85 y.o. [name] Primary Care Jearl Soto: Anastasia Pall Other Clinician: Referring Posie Lillibridge: Treating Sharisa Toves/Extender: Cena Benton in Treatment: 4 Vital Signs Time Taken: 11:43 Temperature (F): 98.0 Height (in): 75 Pulse (bpm): 82 Weight (lbs): 202 Respiratory Rate (breaths/min): 17 Body Mass Index (BMI): 25.2 Blood Pressure (mmHg): 121/73 Reference Range: 80 - 120 mg / dl Electronic Signature(s) Signed: 08/23/2020 2:30:46 PM By: Sandre Kitty Entered By: Sandre Kitty on 08/23/2020 11:43:53

## 2020-08-30 ENCOUNTER — Other Ambulatory Visit: Payer: Self-pay

## 2020-08-30 ENCOUNTER — Encounter (HOSPITAL_BASED_OUTPATIENT_CLINIC_OR_DEPARTMENT_OTHER): Payer: Medicare HMO | Attending: Physician Assistant | Admitting: Physician Assistant

## 2020-08-30 DIAGNOSIS — L97522 Non-pressure chronic ulcer of other part of left foot with fat layer exposed: Secondary | ICD-10-CM | POA: Diagnosis not present

## 2020-08-30 DIAGNOSIS — L97528 Non-pressure chronic ulcer of other part of left foot with other specified severity: Secondary | ICD-10-CM | POA: Diagnosis present

## 2020-08-30 DIAGNOSIS — I429 Cardiomyopathy, unspecified: Secondary | ICD-10-CM | POA: Insufficient documentation

## 2020-08-30 NOTE — Progress Notes (Addendum)
ZALAN, HENNINGSEN ([id number]) Visit Report for 08/30/2020 Chief Complaint Document Details Patient Name: Date of Service: PEA RMA N, [name] 08/30/2020 10:30 A M Medical Record Number: [id number] Patient Account Number: [id number] Date of Birth/Sex: Treating RN: [birth year] (85 y.o. Damaris Schooner Primary Care Provider: Antony Haste Other Clinician: Referring Provider: Treating Provider/Extender: Nanetta Batty in Treatment: 5 Information Obtained from: Patient Chief Complaint 07/24/2020; patient is here for review of wound on his left mid plantar foot Electronic Signature(s) Signed: 08/30/2020 10:44:03 AM By: Lenda Kelp PA-C Entered By: Lenda Kelp on 08/30/2020 10:44:03 -------------------------------------------------------------------------------- Debridement Details Patient Name: Date of Service: PEA RMA N, [name] 08/30/2020 10:30 A M Medical Record Number: [id number] Patient Account Number: [id number] Date of Birth/Sex: Treating RN: Apr 01, 1936 (85 y.o. Damaris Schooner Primary Care Provider: Antony Haste Other Clinician: Referring Provider: Treating Provider/Extender: Nanetta Batty in Treatment: 5 Debridement Performed for Assessment: Wound #1 Left,Medial,Plantar Foot Performed By: Physician Lenda Kelp, PA Debridement Type: Debridement Level of Consciousness (Pre-procedure): Awake and Alert Pre-procedure Verification/Time Out Yes - 11:55 Taken: Start Time: 11:55 Pain Control: Lidocaine 4% T opical Solution T Area Debrided (L x W): otal 0.5 (cm) x 0.5 (cm) = 0.25 (cm) Tissue and other material debrided: Viable, Non-Viable, Callus, Subcutaneous, Skin: Epidermis Level: Skin/Subcutaneous Tissue Debridement Description: Excisional Instrument: Curette Bleeding: Minimum Hemostasis Achieved: Pressure End Time: 11:57 Procedural Pain: 0 Post Procedural Pain: 0 Response to Treatment: Procedure was  tolerated well Level of Consciousness (Post- Awake and Alert procedure): Post Debridement Measurements of Total Wound Length: (cm) 0.2 Stage: Category/Stage II Width: (cm) 0.3 Depth: (cm) 0.2 Volume: (cm) 0.009 Character of Wound/Ulcer Post Debridement: Improved Post Procedure Diagnosis Same as Pre-procedure Electronic Signature(s) Signed: 08/30/2020 12:46:28 PM By: Lenda Kelp PA-C Signed: 08/30/2020 5:59:57 PM By: Zenaida Deed RN, BSN Entered By: Zenaida Deed on 08/30/2020 11:58:07 -------------------------------------------------------------------------------- HPI Details Patient Name: Date of Service: PEA RMA N, [name] 08/30/2020 10:30 A M Medical Record Number: [id number] Patient Account Number: [id number] Date of Birth/Sex: Treating RN: September 04, 1935 (85 y.o. Damaris Schooner Primary Care Provider: Antony Haste Other Clinician: Referring Provider: Treating Provider/Extender: Nanetta Batty in Treatment: 5 History of Present Illness HPI Description: ADMISSION 07/24/2020 This is a 85 year old man who has had a wound on his left medial mid plantar foot for 2 years. He states this started as a blister. He saw Dr. Karie Schwalbe a local [facility] podiatrist and then subsequently was referred to Dr. Logan Bores in November 2019 noted he had an ulcer of the left foot and x-ray showed navicular exostosis. There was some discussion about surgery at that time however the wound healed within a month and noting his significant cardiac issues no further surgical work-up was entertained. The patient has currently been putting gentamicin on the wound he has an insole. Past medical history includes heart failure with reduced ejection fraction, chronic atrial fibrillation, ICD placement, hypertension, hypercholesterolemia, COPD ABI in our clinic was 1.1 on the left 08/23/2020 upon evaluation today patient appears to be doing poorly in regard to his plantar foot wound. There is a lot of  callus around the edges. We have not seen him for a month but this is secondary to the fact that he was actually having to be seen at [facility] and have a pacemaker reimplanted it sounds like. Nonetheless obviously that was important he has been taking care of this at home on his own.  He is not used any of the supplies we gave him he has not use the shoe that we gave him and he brings all those back in here today infected supplies were ordered for him is still in a completely unopened box. Nonetheless he tells me that he has been using a "cream" that he got from "Dr. Karie Schwalbe Again I am really not sure what exactly this is that he is using but nonetheless I am om" not certain that there is any cream that is really going to do the job here as far as getting this under control. Nonetheless I would like to see what it is that he is using before we have that discussion that is. 08/30/2020 upon evaluation today patient appears to be doing well currently in regard to his plantar foot ulcer. He still been using the gentamicin cream that was the one he was told about last week. Nonetheless I do believe he would do better with collagen based on what I am seeing currently. Electronic Signature(s) Signed: 08/30/2020 12:01:55 PM By: Lenda Kelp PA-C Entered By: Lenda Kelp on 08/30/2020 12:01:55 -------------------------------------------------------------------------------- Physical Exam Details Patient Name: Date of Service: PEA RMA N, [name] 08/30/2020 10:30 A M Medical Record Number: [id number] Patient Account Number: [id number] Date of Birth/Sex: Treating RN: 1936-01-23 (85 y.o. Damaris Schooner Primary Care Provider: Antony Haste Other Clinician: Referring Provider: Treating Provider/Extender: Nanetta Batty in Treatment: 5 Constitutional Well-nourished and well-hydrated in no acute distress. Respiratory normal breathing without difficulty. Psychiatric this patient is  able to make decisions and demonstrates good insight into disease process. Alert and Oriented x 3. pleasant and cooperative. Notes Upon inspection patient's wound did require sharp debridement clear away some of the necrotic debris he tolerated that today without complication post debridement wound bed appears to be doing much better which is great news. No fevers, chills, nausea, vomiting, or diarrhea. Electronic Signature(s) Signed: 08/30/2020 12:02:09 PM By: Lenda Kelp PA-C Entered By: Lenda Kelp on 08/30/2020 12:02:09 -------------------------------------------------------------------------------- Physician Orders Details Patient Name: Date of Service: PEA RMA N, [name] 08/30/2020 10:30 A M Medical Record Number: [id number] Patient Account Number: [id number] Date of Birth/Sex: Treating RN: [birth year] (85 y.o. Damaris Schooner Primary Care Provider: Antony Haste Other Clinician: Referring Provider: Treating Provider/Extender: Nanetta Batty in Treatment: 5 Verbal / Phone Orders: No Diagnosis Coding ICD-10 Coding Code Description [phone] Non-pressure chronic ulcer of other part of left foot with other specified severity I42.9 Cardiomyopathy, unspecified Follow-up Appointments Return Appointment in 1 week. Bathing/ Shower/ Hygiene May shower and wash wound with soap and water. - on days that dressing is changed Wound Treatment Wound #1 - Foot Wound Laterality: Plantar, Left, Medial Cleanser: Soap and Water Every Other Day/7 Days Discharge Instructions: May shower and wash wound with dial antibacterial soap and water prior to dressing change. Prim Dressing: Promogran Prisma Matrix, 4.34 (sq in) (silver collagen) Every Other Day/7 Days ary Discharge Instructions: Moisten collagen with saline or hydrogel Secondary Dressing: Optifoam Non-Adhesive Dressing, 4x4 in Every Other Day/7 Days Discharge Instructions: foam donut callous pad Secondary  Dressing: ComfortFoam Border, 3x3 in (silicone border) Every Other Day/7 Days Discharge Instructions: or bandaid Apply over primary dressing as directed. Electronic Signature(s) Signed: 08/30/2020 12:46:28 PM By: Lenda Kelp PA-C Signed: 08/30/2020 5:59:57 PM By: Zenaida Deed RN, BSN Entered By: Zenaida Deed on 08/30/2020 11:56:27 -------------------------------------------------------------------------------- Problem List Details Patient Name: Date of Service: PEA RMA N,  [name] 08/30/2020 10:30 A M Medical Record Number: [id number] Patient Account Number: [id number] Date of Birth/Sex: Treating RN: 02/19/1936 (85 y.o. Damaris Schooner Primary Care Provider: Antony Haste Other Clinician: Referring Provider: Treating Provider/Extender: Nanetta Batty in Treatment: 5 Active Problems ICD-10 Encounter Code Description Active Date MDM Diagnosis L97.528 Non-pressure chronic ulcer of other part of left foot with other specified 07/24/2020 No Yes severity I42.9 Cardiomyopathy, unspecified 07/24/2020 No Yes Inactive Problems Resolved Problems Electronic Signature(s) Signed: 08/30/2020 10:43:58 AM By: Lenda Kelp PA-C Entered By: Lenda Kelp on 08/30/2020 10:43:58 -------------------------------------------------------------------------------- Progress Note Details Patient Name: Date of Service: PEA RMA N, [name] 08/30/2020 10:30 A M Medical Record Number: [id number] Patient Account Number: [id number] Date of Birth/Sex: Treating RN: [birth year] (85 y.o. Damaris Schooner Primary Care Provider: Antony Haste Other Clinician: Referring Provider: Treating Provider/Extender: Nanetta Batty in Treatment: 5 Subjective Chief Complaint Information obtained from Patient 07/24/2020; patient is here for review of wound on his left mid plantar foot History of Present Illness (HPI) ADMISSION 07/24/2020 This is a 85 year old man  who has had a wound on his left medial mid plantar foot for 2 years. He states this started as a blister. He saw Dr. Karie Schwalbe a local [facility] podiatrist and then subsequently was referred to Dr. Logan Bores in November 2019 noted he had an ulcer of the left foot and x-ray showed navicular exostosis. There was some discussion about surgery at that time however the wound healed within a month and noting his significant cardiac issues no further surgical work-up was entertained. The patient has currently been putting gentamicin on the wound he has an insole. Past medical history includes heart failure with reduced ejection fraction, chronic atrial fibrillation, ICD placement, hypertension, hypercholesterolemia, COPD ABI in our clinic was 1.1 on the left 08/23/2020 upon evaluation today patient appears to be doing poorly in regard to his plantar foot wound. There is a lot of callus around the edges. We have not seen him for a month but this is secondary to the fact that he was actually having to be seen at [facility] and have a pacemaker reimplanted it sounds like. Nonetheless obviously that was important he has been taking care of this at home on his own. He is not used any of the supplies we gave him he has not use the shoe that we gave him and he brings all those back in here today infected supplies were ordered for him is still in a completely unopened box. Nonetheless he tells me that he has been using a "cream" that he got from "Dr. Karie Schwalbe Again I am really not sure what exactly this is that he is using but nonetheless I am om" not certain that there is any cream that is really going to do the job here as far as getting this under control. Nonetheless I would like to see what it is that he is using before we have that discussion that is. 08/30/2020 upon evaluation today patient appears to be doing well currently in regard to his plantar foot ulcer. He still been using the gentamicin cream that was the one he was told about  last week. Nonetheless I do believe he would do better with collagen based on what I am seeing currently. Objective Constitutional Well-nourished and well-hydrated in no acute distress. Vitals Time Taken: 11:20 AM, Height: 75 in, Weight: 202 lbs, BMI: 25.2, Temperature: 98.3 F, Pulse: 75 bpm, Respiratory Rate: 16  breaths/min, Blood Pressure: 108/67 mmHg. Respiratory normal breathing without difficulty. Psychiatric this patient is able to make decisions and demonstrates good insight into disease process. Alert and Oriented x 3. pleasant and cooperative. General Notes: Upon inspection patient's wound did require sharp debridement clear away some of the necrotic debris he tolerated that today without complication post debridement wound bed appears to be doing much better which is great news. No fevers, chills, nausea, vomiting, or diarrhea. Integumentary (Hair, Skin) Wound #1 status is Open. Original cause of wound was Gradually Appeared. The date acquired was: 06/27/2018. The wound has been in treatment 5 weeks. The wound is located on the Left,Medial,Plantar Foot. The wound measures 0.2cm length x 0.3cm width x 0.2cm depth; 0.047cm^2 area and 0.009cm^3 volume. There is Fat Layer (Subcutaneous Tissue) exposed. There is no tunneling or undermining noted. There is a medium amount of serosanguineous drainage noted. The wound margin is distinct with the outline attached to the wound base. There is large (67-100%) pink granulation within the wound bed. There is no necrotic tissue within the wound bed. Assessment Active Problems ICD-10 Non-pressure chronic ulcer of other part of left foot with other specified severity Cardiomyopathy, unspecified Procedures Wound #1 Pre-procedure diagnosis of Wound #1 is a Pressure Ulcer located on the Left,Medial,Plantar Foot . There was a Excisional Skin/Subcutaneous Tissue Debridement with a total area of 0.25 sq cm performed by Lenda KelpStone III, Amar Sippel, PA. With the  following instrument(s): Curette to remove Viable and Non-Viable tissue/material. Material removed includes Callus, Subcutaneous Tissue, and Skin: Epidermis after achieving pain control using Lidocaine 4% Topical Solution. No specimens were taken. A time out was conducted at 11:55, prior to the start of the procedure. A Minimum amount of bleeding was controlled with Pressure. The procedure was tolerated well with a pain level of 0 throughout and a pain level of 0 following the procedure. Post Debridement Measurements: 0.2cm length x 0.3cm width x 0.2cm depth; 0.009cm^3 volume. Post debridement Stage noted as Category/Stage II. Character of Wound/Ulcer Post Debridement is improved. Post procedure Diagnosis Wound #1: Same as Pre-Procedure Plan Follow-up Appointments: Return Appointment in 1 week. Bathing/ Shower/ Hygiene: May shower and wash wound with soap and water. - on days that dressing is changed WOUND #1: - Foot Wound Laterality: Plantar, Left, Medial Cleanser: Soap and Water Every Other Day/7 Days Discharge Instructions: May shower and wash wound with dial antibacterial soap and water prior to dressing change. Prim Dressing: Promogran Prisma Matrix, 4.34 (sq in) (silver collagen) Every Other Day/7 Days ary Discharge Instructions: Moisten collagen with saline or hydrogel Secondary Dressing: Optifoam Non-Adhesive Dressing, 4x4 in Every Other Day/7 Days Discharge Instructions: foam donut callous pad Secondary Dressing: ComfortFoam Border, 3x3 in (silicone border) Every Other Day/7 Days Discharge Instructions: or bandaid Apply over primary dressing as directed. 1. Would recommend currently that we go ahead and continue with the silver collagen as the optimal dressing of choice over the next week based on what I am seeing I think this is a very good option. 2. I am also can recommend that the patient should not use the gentamicin cream at all think that is necessary any longer. 3. I am also  can recommend the patient should avoid walking as much as he can to try to prevent any callus buildup and injury as far as allowing this to heal is concerned. I think that basically we need to pad the area with a border foam dressing and then tried to allow the collagen to do his job  as far supplement new epithelial and granular growth. We will see patient back for reevaluation in 1 week here in the clinic. If anything worsens or changes patient will contact our office for additional recommendations. Electronic Signature(s) Signed: 08/30/2020 12:02:49 PM By: Lenda Kelp PA-C Entered By: Lenda Kelp on 08/30/2020 12:02:49 -------------------------------------------------------------------------------- SuperBill Details Patient Name: Date of Service: PEA RMA N, [name] 08/30/2020 Medical Record Number: [id number] Patient Account Number: [id number] Date of Birth/Sex: Treating RN: 1936/02/18 (85 y.o. Damaris Schooner Primary Care Provider: Antony Haste Other Clinician: Referring Provider: Treating Provider/Extender: Nanetta Batty in Treatment: 5 Diagnosis Coding ICD-10 Codes Code Description [phone] Non-pressure chronic ulcer of other part of left foot with other specified severity I42.9 Cardiomyopathy, unspecified Facility Procedures CPT4 Code: [id number] Description: 11042 - DEB SUBQ TISSUE 20 SQ CM/< ICD-10 Diagnosis Description L97.528 Non-pressure chronic ulcer of other part of left foot with other specified seve Modifier: rity Quantity: 1 Physician Procedures : CPT4 Code Description Modifier [id number] 11042 - WC PHYS SUBQ TISS 20 SQ CM ICD-10 Diagnosis Description L97.528 Non-pressure chronic ulcer of other part of left foot with other specified severity Quantity: 1 Electronic Signature(s) Signed: 08/30/2020 12:02:56 PM By: Lenda Kelp PA-C Entered By: Lenda Kelp on 08/30/2020 12:02:55

## 2020-08-31 NOTE — Progress Notes (Signed)
VESTAL, MARKIN ([id number]) Visit Report for 08/30/2020 Arrival Information Details Patient Name: Date of Service: PEA RMA N, [name] 08/30/2020 10:30 A M Medical Record Number: [id number] Patient Account Number: [id number] Date of Birth/Sex: Treating RN: 19-Jun-1935 (85 y.o. [name] Primary Care Kaliah Haddaway: Anastasia Pall Other Clinician: Referring Zakari Couchman: Treating Dominika Losey/Extender: Cena Benton in Treatment: 5 Visit Information History Since Last Visit Added or deleted any medications: No Patient Arrived: Ambulatory Any new allergies or adverse reactions: No Arrival Time: 11:19 Had a fall or experienced change in No Accompanied By: friend activities of daily living that may affect Transfer Assistance: None risk of falls: Patient Identification Verified: Yes Signs or symptoms of abuse/neglect since last visito No Secondary Verification Process Completed: Yes Hospitalized since last visit: No Patient Requires Transmission-Based Precautions: No Implantable device outside of the clinic excluding No Patient Has Alerts: No cellular tissue based products placed in the center since last visit: Has Dressing in Place as Prescribed: Yes Pain Present Now: No Electronic Signature(s) Signed: 08/30/2020 5:44:43 PM By: Levan Hurst RN, BSN Entered By: Levan Hurst on 08/30/2020 11:24:22 -------------------------------------------------------------------------------- Encounter Discharge Information Details Patient Name: Date of Service: PEA RMA N, [name] 08/30/2020 10:30 A M Medical Record Number: [id number] Patient Account Number: [id number] Date of Birth/Sex: Treating RN: 1936-04-27 (85 y.o. Hessie Diener Primary Care Reice Bienvenue: Anastasia Pall Other Clinician: Referring Kimbery Harwood: Treating Darwin Rothlisberger/Extender: Cena Benton in Treatment: 5 Encounter Discharge Information Items Post Procedure Vitals Discharge Condition:  Stable Temperature (F): 98.3 Ambulatory Status: Ambulatory Pulse (bpm): 75 Discharge Destination: Home Respiratory Rate (breaths/min): 16 Transportation: Private Auto Blood Pressure (mmHg): 108/67 Accompanied By: friend Schedule Follow-up Appointment: Yes Clinical Summary of Care: Electronic Signature(s) Signed: 08/30/2020 5:48:55 PM By: Deon Pilling Entered By: Deon Pilling on 08/30/2020 12:38:24 -------------------------------------------------------------------------------- Lower Extremity Assessment Details Patient Name: Date of Service: PEA RMA N, [name] 08/30/2020 10:30 A M Medical Record Number: [id number] Patient Account Number: [id number] Date of Birth/Sex: Treating RN: 02-Jan-1936 (85 y.o. [name] Primary Care Sonal Dorwart: Anastasia Pall Other Clinician: Referring Jailynne Opperman: Treating [place] Breeding/Extender: Cena Benton in Treatment: 5 Edema Assessment Assessed: Shirlyn Goltz: No] [name]: No] Edema: [Left: N] [Right: o] Calf Left: Right: Point of Measurement: 40 cm From Medial Instep 34 cm Ankle Left: Right: Point of Measurement: 10 cm From Medial Instep 21 cm Vascular Assessment Pulses: Dorsalis Pedis Palpable: [Left:Yes] Electronic Signature(s) Signed: 08/30/2020 5:44:43 PM By: Levan Hurst RN, BSN Entered By: Levan Hurst on 08/30/2020 11:23:41 -------------------------------------------------------------------------------- Dike Details Patient Name: Date of Service: PEA RMA N, [name] 08/30/2020 10:30 A M Medical Record Number: [id number] Patient Account Number: [id number] Date of Birth/Sex: Treating RN: 1935/10/07 (85 y.o. [name] Primary Care Hooria Gasparini: Anastasia Pall Other Clinician: Referring Robie Mcniel: Treating Tiaira Arambula/Extender: Cena Benton in Treatment: 5 Multidisciplinary Care Plan reviewed with physician Active Inactive Abuse / Safety / Falls / Self Care  Management Nursing Diagnoses: Potential for falls Potential for injury related to falls Goals: Patient will not experience any injury related to falls Date Initiated: 07/24/2020 Target Resolution Date: 09/20/2020 Goal Status: Active Patient/caregiver will verbalize/demonstrate measures taken to prevent injury and/or falls Date Initiated: 07/24/2020 Target Resolution Date: 09/20/2020 Goal Status: Active Interventions: Assess Activities of Daily Living upon admission and as needed Assess fall risk on admission and as needed Assess: immobility, friction, shearing, incontinence upon admission and as needed Assess impairment of mobility on admission and  as needed per policy Assess personal safety and home safety (as indicated) on admission and as needed Assess self care needs on admission and as needed Provide education on fall prevention Provide education on personal and home safety Notes: Wound/Skin Impairment Nursing Diagnoses: Impaired tissue integrity Knowledge deficit related to ulceration/compromised skin integrity Goals: Patient/caregiver will verbalize understanding of skin care regimen Date Initiated: 07/24/2020 Target Resolution Date: 09/20/2020 Goal Status: Active Ulcer/skin breakdown will have a volume reduction of 30% by week 4 Date Initiated: 07/24/2020 Date Inactivated: 08/23/2020 Target Resolution Date: 07/28/2020 Goal Status: Met Ulcer/skin breakdown will have a volume reduction of 80% by week 12 Date Initiated: 08/23/2020 Target Resolution Date: 09/20/2020 Goal Status: Active Interventions: Assess patient/caregiver ability to obtain necessary supplies Assess patient/caregiver ability to perform ulcer/skin care regimen upon admission and as needed Assess ulceration(s) every visit Provide education on ulcer and skin care Notes: Electronic Signature(s) Signed: 08/30/2020 5:59:57 PM By: Baruch Gouty RN, BSN Entered By: Baruch Gouty on 08/30/2020  11:51:36 -------------------------------------------------------------------------------- Pain Assessment Details Patient Name: Date of Service: PEA RMA N, [name] 08/30/2020 10:30 A M Medical Record Number: [id number] Patient Account Number: [id number] Date of Birth/Sex: Treating RN: March 12, 1936 (85 y.o. [name] Primary Care Tully Burgo: Anastasia Pall Other Clinician: Referring Audryana Hockenberry: Treating Milik Gilreath/Extender: Cena Benton in Treatment: 5 Active Problems Location of Pain Severity and Description of Pain Patient Has Paino No Site Locations Pain Management and Medication Current Pain Management: Electronic Signature(s) Signed: 08/30/2020 5:44:43 PM By: Levan Hurst RN, BSN Entered By: Levan Hurst on 08/30/2020 11:23:37 -------------------------------------------------------------------------------- Patient/Caregiver Education Details Patient Name: Date of Service: PEA RMA N, [name] 4/6/2022andnbsp10:30 A M Medical Record Number: [id number] Patient Account Number: [id number] Date of Birth/Gender: Treating RN: [birth year] (85 y.o. [name] Primary Care Physician: Anastasia Pall Other Clinician: Referring Physician: Treating Physician/Extender: Cena Benton in Treatment: 5 Education Assessment Education Provided To: Patient Education Topics Provided Offloading: Methods: Explain/Verbal Responses: Reinforcements needed, State content correctly Wound/Skin Impairment: Methods: Explain/Verbal Responses: Reinforcements needed, State content correctly Electronic Signature(s) Signed: 08/30/2020 5:59:57 PM By: Baruch Gouty RN, BSN Entered By: Baruch Gouty on 08/30/2020 11:52:38 -------------------------------------------------------------------------------- Wound Assessment Details Patient Name: Date of Service: PEA RMA N, [name] 08/30/2020 10:30 A M Medical Record Number:  [id number] Patient Account Number: [id number] Date of Birth/Sex: Treating RN: 1936-02-12 (85 y.o. [name] Primary Care Zanyla Klebba: Anastasia Pall Other Clinician: Referring Sheliah Fiorillo: Treating Graelyn Bihl/Extender: Cena Benton in Treatment: 5 Wound Status Wound Number: 1 Primary Pressure Ulcer Etiology: Wound Location: Left, Medial, Plantar Foot Wound Open Wounding Event: Gradually Appeared Status: Date Acquired: 06/27/2018 Comorbid Chronic Obstructive Pulmonary Disease (COPD), Arrhythmia, Weeks Of Treatment: 5 History: Congestive Heart Failure, Hypertension Clustered Wound: No Photos Wound Measurements Length: (cm) 0.2 Width: (cm) 0.3 Depth: (cm) 0.2 Area: (cm) 0.047 Volume: (cm) 0.009 % Reduction in Area: 25.4% % Reduction in Volume: 52.6% Epithelialization: Large (67-100%) Tunneling: No Undermining: No Wound Description Classification: Category/Stage II Wound Margin: Distinct, outline attached Exudate Amount: Medium Exudate Type: Serosanguineous Exudate Color: red, brown Foul Odor After Cleansing: No Slough/Fibrino No Wound Bed Granulation Amount: Large (67-100%) Exposed Structure Granulation Quality: Pink Fascia Exposed: No Necrotic Amount: None Present (0%) Fat Layer (Subcutaneous Tissue) Exposed: Yes Tendon Exposed: No Muscle Exposed: No Joint Exposed: No Bone Exposed: No Treatment Notes Wound #1 (Foot) Wound Laterality: Plantar, Left, Medial Cleanser Soap and Water Discharge Instruction: May shower and wash wound with dial antibacterial soap  and water prior to dressing change. Peri-Wound Care Topical Primary Dressing Promogran Prisma Matrix, 4.34 (sq in) (silver collagen) Discharge Instruction: Moisten collagen with saline or hydrogel Secondary Dressing Optifoam Non-Adhesive Dressing, 4x4 in Discharge Instruction: foam donut callous pad ComfortFoam Border, 3x3 in (silicone border) Discharge Instruction: or bandaid  Apply over primary dressing as directed. Secured With Compression Wrap Compression Stockings Environmental education officer) Signed: 08/30/2020 5:44:43 PM By: Levan Hurst RN, BSN Signed: 08/31/2020 4:58:23 PM By: Sandre Kitty Entered By: Sandre Kitty on 08/30/2020 16:29:56 -------------------------------------------------------------------------------- Vitals Details Patient Name: Date of Service: PEA RMA N, [name] 08/30/2020 10:30 A M Medical Record Number: [id number] Patient Account Number: [id number] Date of Birth/Sex: Treating RN: Apr 14, 1936 (85 y.o. [name] Primary Care Hilbert Briggs: Anastasia Pall Other Clinician: Referring Armin Yerger: Treating Kazuo Durnil/Extender: Cena Benton in Treatment: 5 Vital Signs Time Taken: 11:20 Temperature (F): 98.3 Height (in): 75 Pulse (bpm): 75 Weight (lbs): 202 Respiratory Rate (breaths/min): 16 Body Mass Index (BMI): 25.2 Blood Pressure (mmHg): 108/67 Reference Range: 80 - 120 mg / dl Electronic Signature(s) Signed: 08/30/2020 5:44:43 PM By: Levan Hurst RN, BSN Entered By: Levan Hurst on 08/30/2020 11:20:31

## 2020-09-06 ENCOUNTER — Other Ambulatory Visit: Payer: Self-pay

## 2020-09-06 ENCOUNTER — Encounter (HOSPITAL_BASED_OUTPATIENT_CLINIC_OR_DEPARTMENT_OTHER): Payer: Medicare HMO | Admitting: Physician Assistant

## 2020-09-06 DIAGNOSIS — L97522 Non-pressure chronic ulcer of other part of left foot with fat layer exposed: Secondary | ICD-10-CM | POA: Diagnosis not present

## 2020-09-06 NOTE — Progress Notes (Addendum)
YOUNES, BROCATO ([id number]) Visit Report for 09/06/2020 Chief Complaint Document Details Patient Name: Date of Service: PEA RMA N, [name] 09/06/2020 1:45 PM Medical Record Number: [id number] Patient Account Number: [id number] Date of Birth/Sex: Treating RN: [birth year] (85 y.o. Damaris Schooner Primary Care Provider: Antony Haste Other Clinician: Referring Provider: Treating Provider/Extender: Nanetta Batty in Treatment: 6 Information Obtained from: Patient Chief Complaint 07/24/2020; patient is here for review of wound on his left mid plantar foot Electronic Signature(s) Signed: 09/06/2020 2:07:31 PM By: Lenda Kelp PA-C Entered By: Lenda Kelp on 09/06/2020 14:07:31 -------------------------------------------------------------------------------- HPI Details Patient Name: Date of Service: PEA RMA N, [name] 09/06/2020 1:45 PM Medical Record Number: [id number] Patient Account Number: [id number] Date of Birth/Sex: Treating RN: [birth year] (85 y.o. Damaris Schooner Primary Care Provider: Antony Haste Other Clinician: Referring Provider: Treating Provider/Extender: Nanetta Batty in Treatment: 6 History of Present Illness HPI Description: ADMISSION 07/24/2020 This is a 85 year old man who has had a wound on his left medial mid plantar foot for 2 years. He states this started as a blister. He saw Dr. Karie Schwalbe a local [facility] podiatrist and then subsequently was referred to Dr. Logan Bores in November 2019 noted he had an ulcer of the left foot and x-ray showed navicular exostosis. There was some discussion about surgery at that time however the wound healed within a month and noting his significant cardiac issues no further surgical work-up was entertained. The patient has currently been putting gentamicin on the wound he has an insole. Past medical history includes heart failure with reduced ejection fraction, chronic atrial  fibrillation, ICD placement, hypertension, hypercholesterolemia, COPD ABI in our clinic was 1.1 on the left 08/23/2020 upon evaluation today patient appears to be doing poorly in regard to his plantar foot wound. There is a lot of callus around the edges. We have not seen him for a month but this is secondary to the fact that he was actually having to be seen at [facility] and have a pacemaker reimplanted it sounds like. Nonetheless obviously that was important he has been taking care of this at home on his own. He is not used any of the supplies we gave him he has not use the shoe that we gave him and he brings all those back in here today infected supplies were ordered for him is still in a completely unopened box. Nonetheless he tells me that he has been using a "cream" that he got from "Dr. Karie Schwalbe Again I am really not sure what exactly this is that he is using but nonetheless I am om" not certain that there is any cream that is really going to do the job here as far as getting this under control. Nonetheless I would like to see what it is that he is using before we have that discussion that is. 08/30/2020 upon evaluation today patient appears to be doing well currently in regard to his plantar foot ulcer. He still been using the gentamicin cream that was the one he was told about last week. Nonetheless I do believe he would do better with collagen based on what I am seeing currently. 09/06/2020 upon evaluation today patient appears to be doing well with regard to his plantar foot. Fortunately there does not appear to be any signs of active infection at this time. In fact he appears to be completely healed based on what I am seeing currently. Electronic Signature(s) Signed: 09/06/2020 2:31:39 PM  By: Lenda Kelp PA-C Signed: 09/06/2020 2:31:39 PM By: Lenda Kelp PA-C Entered By: Lenda Kelp on 09/06/2020  14:31:39 -------------------------------------------------------------------------------- Physical Exam Details Patient Name: Date of Service: PEA RMA N, [name] 09/06/2020 1:45 PM Medical Record Number: [id number] Patient Account Number: [id number] Date of Birth/Sex: Treating RN: 08-28-1935 (85 y.o. Damaris Schooner Primary Care Provider: Antony Haste Other Clinician: Referring Provider: Treating Provider/Extender: Nanetta Batty in Treatment: 6 Constitutional Well-nourished and well-hydrated in no acute distress. Respiratory normal breathing without difficulty. Psychiatric this patient is able to make decisions and demonstrates good insight into disease process. Alert and Oriented x 3. pleasant and cooperative. Notes Upon inspection today patient's wound bed actually showed signs of good granulation epithelization at this point. There does not appear to be any signs of infection which is great news and overall I am extremely pleased with where things stand today. Electronic Signature(s) Signed: 09/06/2020 2:31:54 PM By: Lenda Kelp PA-C Entered By: Lenda Kelp on 09/06/2020 14:31:54 -------------------------------------------------------------------------------- Physician Orders Details Patient Name: Date of Service: PEA RMA N, [name] 09/06/2020 1:45 PM Medical Record Number: [id number] Patient Account Number: [id number] Date of Birth/Sex: Treating RN: [birth year] (85 y.o. Damaris Schooner Primary Care Provider: Antony Haste Other Clinician: Referring Provider: Treating Provider/Extender: Nanetta Batty in Treatment: 6 Verbal / Phone Orders: No Diagnosis Coding ICD-10 Coding Code Description [phone] Non-pressure chronic ulcer of other part of left foot with other specified severity I42.9 Cardiomyopathy, unspecified Discharge From [facility] Services Discharge from Wound Care Center Bathing/ Shower/ Hygiene May  shower and wash wound with soap and water. - lotion to both feet after showering Off-Loading Other: - foam callous pads to bottom of left foot daily to protect Electronic Signature(s) Signed: 09/06/2020 5:03:59 PM By: Lenda Kelp PA-C Signed: 09/06/2020 5:38:32 PM By: Zenaida Deed RN, BSN Signed: 09/06/2020 5:38:32 PM By: Zenaida Deed RN, BSN Entered By: Zenaida Deed on 09/06/2020 14:29:24 -------------------------------------------------------------------------------- Problem List Details Patient Name: Date of Service: PEA RMA N, [name] 09/06/2020 1:45 PM Medical Record Number: [id number] Patient Account Number: [id number] Date of Birth/Sex: Treating RN: 04-23-1936 (85 y.o. Damaris Schooner Primary Care Provider: Antony Haste Other Clinician: Referring Provider: Treating Provider/Extender: Nanetta Batty in Treatment: 6 Active Problems ICD-10 Encounter Code Description Active Date MDM Diagnosis L97.528 Non-pressure chronic ulcer of other part of left foot with other specified 07/24/2020 No Yes severity I42.9 Cardiomyopathy, unspecified 07/24/2020 No Yes Inactive Problems Resolved Problems Electronic Signature(s) Signed: 09/06/2020 2:04:16 PM By: Lenda Kelp PA-C Entered By: Lenda Kelp on 09/06/2020 14:04:16 -------------------------------------------------------------------------------- Progress Note Details Patient Name: Date of Service: PEA RMA N, [name] 09/06/2020 1:45 PM Medical Record Number: [id number] Patient Account Number: [id number] Date of Birth/Sex: Treating RN: [birth year] (85 y.o. Damaris Schooner Primary Care Provider: Antony Haste Other Clinician: Referring Provider: Treating Provider/Extender: Nanetta Batty in Treatment: 6 Subjective Chief Complaint Information obtained from Patient 07/24/2020; patient is here for review of wound on his left mid plantar foot History of Present  Illness (HPI) ADMISSION 07/24/2020 This is a 85 year old man who has had a wound on his left medial mid plantar foot for 2 years. He states this started as a blister. He saw Dr. Karie Schwalbe a local [facility] podiatrist and then subsequently was referred to Dr. Logan Bores in November 2019 noted he had an ulcer of the left foot and x-ray showed navicular exostosis.  There was some discussion about surgery at that time however the wound healed within a month and noting his significant cardiac issues no further surgical work-up was entertained. The patient has currently been putting gentamicin on the wound he has an insole. Past medical history includes heart failure with reduced ejection fraction, chronic atrial fibrillation, ICD placement, hypertension, hypercholesterolemia, COPD ABI in our clinic was 1.1 on the left 08/23/2020 upon evaluation today patient appears to be doing poorly in regard to his plantar foot wound. There is a lot of callus around the edges. We have not seen him for a month but this is secondary to the fact that he was actually having to be seen at [facility] and have a pacemaker reimplanted it sounds like. Nonetheless obviously that was important he has been taking care of this at home on his own. He is not used any of the supplies we gave him he has not use the shoe that we gave him and he brings all those back in here today infected supplies were ordered for him is still in a completely unopened box. Nonetheless he tells me that he has been using a "cream" that he got from "Dr. Karie Schwalbe Again I am really not sure what exactly this is that he is using but nonetheless I am om" not certain that there is any cream that is really going to do the job here as far as getting this under control. Nonetheless I would like to see what it is that he is using before we have that discussion that is. 08/30/2020 upon evaluation today patient appears to be doing well currently in regard to his plantar foot ulcer. He still been  using the gentamicin cream that was the one he was told about last week. Nonetheless I do believe he would do better with collagen based on what I am seeing currently. 09/06/2020 upon evaluation today patient appears to be doing well with regard to his plantar foot. Fortunately there does not appear to be any signs of active infection at this time. In fact he appears to be completely healed based on what I am seeing currently. Objective Constitutional Well-nourished and well-hydrated in no acute distress. Vitals Time Taken: 1:55 PM, Height: 75 in, Weight: 202 lbs, BMI: 25.2, Temperature: 98 F, Pulse: 78 bpm, Respiratory Rate: 17 breaths/min, Blood Pressure: 132/78 mmHg. Respiratory normal breathing without difficulty. Psychiatric this patient is able to make decisions and demonstrates good insight into disease process. Alert and Oriented x 3. pleasant and cooperative. General Notes: Upon inspection today patient's wound bed actually showed signs of good granulation epithelization at this point. There does not appear to be any signs of infection which is great news and overall I am extremely pleased with where things stand today. Integumentary (Hair, Skin) Wound #1 status is Open. Original cause of wound was Gradually Appeared. The date acquired was: 06/27/2018. The wound has been in treatment 6 weeks. The wound is located on the Left,Medial,Plantar Foot. The wound measures 0cm length x 0cm width x 0cm depth; 0cm^2 area and 0cm^3 volume. Assessment Active Problems ICD-10 Non-pressure chronic ulcer of other part of left foot with other specified severity Cardiomyopathy, unspecified Plan Discharge From [facility] Services: Discharge from Wound Care Center Bathing/ Shower/ Hygiene: May shower and wash wound with soap and water. - lotion to both feet after showering Off-Loading: Other: - foam callous pads to bottom of left foot daily to protect 1. Based on what I am seeing today the patient appears  to  be completely healed which is great news. I am going to discontinue wound care services in that regard. With that being said I do think a corn/callus pad would be beneficial to help protect the area get this healed in order to make sure that nothing changes or worsens. 2. I am also can recommend that he can use lotion at night when he takes the callus pad off. We will see patient back for reevaluation in 1 week here in the clinic. If anything worsens or changes patient will contact our office for additional recommendations. Electronic Signature(s) Signed: 09/06/2020 2:32:35 PM By: Lenda Kelp PA-C Signed: 09/06/2020 2:32:35 PM By: Lenda Kelp PA-C Entered By: Lenda Kelp on 09/06/2020 14:32:35 -------------------------------------------------------------------------------- SuperBill Details Patient Name: Date of Service: PEA RMA N, [name] 09/06/2020 Medical Record Number: [id number] Patient Account Number: [id number] Date of Birth/Sex: Treating RN: 05/05/1936 (85 y.o. Damaris Schooner Primary Care Provider: Antony Haste Other Clinician: Referring Provider: Treating Provider/Extender: Nanetta Batty in Treatment: 6 Diagnosis Coding ICD-10 Codes Code Description [id number] Non-pressure chronic ulcer of other part of left foot with other specified severity I42.9 Cardiomyopathy, unspecified Facility Procedures CPT4 Code: [id number] Description: 99213 - WOUND CARE VISIT-LEV 3 EST PT Modifier: Quantity: 1 Physician Procedures : CPT4 Code Description Modifier [id number] 99213 - WC PHYS LEVEL 3 - EST PT ICD-10 Diagnosis Description L97.528 Non-pressure chronic ulcer of other part of left foot with other specified severity I42.9 Cardiomyopathy, unspecified Quantity: 1 Electronic Signature(s) Signed: 09/06/2020 5:03:59 PM By: Lenda Kelp PA-C Signed: 09/06/2020 5:38:32 PM By: Zenaida Deed RN, BSN Previous Signature: 09/06/2020 2:32:42 PM Version  By: Lenda Kelp PA-C Entered By: Zenaida Deed on 09/06/2020 14:34:52

## 2020-09-07 NOTE — Progress Notes (Signed)
[name] ([id number]) Visit Report for 09/06/2020 Arrival Information Details Patient Name: Date of Service: PEA RMA N, DO Scott B. 09/06/2020 1:45 PM Medical Record Number: [id number] Patient Account Number: [id number] Date of Birth/Sex: Treating RN: 1936-01-14 (85 y.o. [name], [name] Primary Care [name]: [name] Other Clinician: Referring [name]: Treating [name]/Extender: [name] in Treatment: 6 Visit Information History Since Last Visit Added or deleted any medications: No Patient Arrived: Ambulatory Any new allergies or adverse reactions: No Arrival Time: 13:54 Had a fall or experienced change in No Accompanied By: self activities of daily living that may affect Transfer Assistance: None risk of falls: Patient Identification Verified: Yes Signs or symptoms of abuse/neglect since last visito No Secondary Verification Process Completed: Yes Hospitalized since last visit: No Patient Requires Transmission-Based Precautions: No Implantable device outside of the clinic excluding No Patient Has Alerts: No cellular tissue based products placed in the center since last visit: Has Dressing in Place as Prescribed: Yes Pain Present Now: No Electronic Signature(s) Signed: 09/07/2020 5:45:48 PM By: Scott Mu RN Entered By: [name] on 09/06/2020 13:55:11 -------------------------------------------------------------------------------- Clinic Level of Care Assessment Details Patient Name: Date of Service: PEA RMA N, DO Scott B. 09/06/2020 1:45 PM Medical Record Number: [id number] Patient Account Number: [id number] Date of Birth/Sex: Treating RN: December 20, 1935 (85 y.o. [name] Primary Care [name]: [name] Other Clinician: Referring Lotta Frankenfield: Treating [name]/Extender: [name] in Treatment: 6 Clinic Level of Care Assessment Items TOOL 4 Quantity Score []  - 0 Use  when only an EandM is performed on FOLLOW-UP visit ASSESSMENTS - Nursing Assessment / Reassessment X- 1 10 Reassessment of Co-morbidities (includes updates in patient status) X- 1 5 Reassessment of Adherence to Treatment Plan ASSESSMENTS - Wound and Skin A ssessment / Reassessment X - Simple Wound Assessment / Reassessment - one wound 1 5 []  - 0 Complex Wound Assessment / Reassessment - multiple wounds []  - 0 Dermatologic / Skin Assessment (not related to wound area) ASSESSMENTS - Focused Assessment []  - 0 Circumferential Edema Measurements - multi extremities []  - 0 Nutritional Assessment / Counseling / Intervention X- 1 5 Lower Extremity Assessment (monofilament, tuning fork, pulses) []  - 0 Peripheral Arterial Disease Assessment (using hand held doppler) ASSESSMENTS - Ostomy and/or Continence Assessment and Care []  - 0 Incontinence Assessment and Management []  - 0 Ostomy Care Assessment and Management (repouching, etc.) PROCESS - Coordination of Care X - Simple Patient / Family Education for ongoing care 1 15 []  - 0 Complex (extensive) Patient / Family Education for ongoing care X- 1 10 Staff obtains Consents, Records, T Results / Process Orders est []  - 0 Staff telephones HHA, Nursing Homes / Clarify orders / etc []  - 0 Routine Transfer to another Facility (non-emergent condition) []  - 0 Routine Hospital Admission (non-emergent condition) []  - 0 New Admissions / / Ordering NPWT Apligraf, etc. , []  - 0 Emergency Hospital Admission (emergent condition) X- 1 10 Simple Discharge Coordination []  - 0 Complex (extensive) Discharge Coordination PROCESS - Special Needs []  - 0 Pediatric / Minor Patient Management []  - 0 Isolation Patient Management []  - 0 Hearing / Language / Visual special needs []  - 0 Assessment of Community assistance (transportation, D/C planning, etc.) []  - 0 Additional assistance / Altered mentation []  - 0 Support  Surface(s) Assessment (bed, cushion, seat, etc.) INTERVENTIONS - Wound Cleansing / Measurement X - Simple Wound Cleansing - one wound 1 5 []  - 0 Complex  Wound Cleansing - multiple wounds X- 1 5 Wound Imaging (photographs - any number of wounds) []  - 0 Wound Tracing (instead of photographs) []  - 0 Simple Wound Measurement - one wound []  - 0 Complex Wound Measurement - multiple wounds INTERVENTIONS - Wound Dressings X - Small Wound Dressing one or multiple wounds 1 10 []  - 0 Medium Wound Dressing one or multiple wounds []  - 0 Large Wound Dressing one or multiple wounds []  - 0 Application of Medications - topical []  - 0 Application of Medications - injection INTERVENTIONS - Miscellaneous []  - 0 External ear exam []  - 0 Specimen Collection (cultures, biopsies, blood, body fluids, etc.) []  - 0 Specimen(s) / Culture(s) sent or taken to Lab for analysis []  - 0 Patient Transfer (multiple staff / / Similar devices) []  - 0 Simple Staple / Suture removal (25 or less) []  - 0 Complex Staple / Suture removal (26 or more) []  - 0 Hypo / Hyperglycemic Management (close monitor of Blood Glucose) []  - 0 Ankle / Brachial Index (ABI) - do not check if billed separately X- 1 5 Vital Signs Has the patient been seen at the hospital within the last three years: Yes Total Score: 85 Level Of Care: New/Established - Level 3 Electronic Signature(s) Signed: 09/06/2020 5:38:32 PM By: RN, BSN Entered By: on 09/06/2020 14:34:41 -------------------------------------------------------------------------------- Encounter Discharge Information Details Patient Name: Date of Service: PEA RMA N, DO Scott B. 09/06/2020 1:45 PM Medical Record Number: Patient Account Number: Date of Birth/Sex: Treating RN: [birth year] (85 y.o. , [name] Primary Care Hanne Kegg: Nurse, adult Other Clinician: Referring Zavia Pullen: Treating  Alejah Aristizabal/Extender: in Treatment: 6 Encounter Discharge Information Items Discharge Condition: Stable Ambulatory Status: Ambulatory Discharge Destination: Home Transportation: Private Auto Accompanied By: self Schedule Follow-up Appointment: Yes Clinical Summary of Care: Patient Declined Electronic Signature(s) Signed: 09/07/2020 5:45:48 PM By: RN Entered By: on 09/06/2020 14:36:37 -------------------------------------------------------------------------------- Lower Extremity Assessment Details Patient Name: Date of Service: PEA RMA N, DO Scott B. 09/06/2020 1:45 PM Medical Record Number: Zenaida Deed Patient Account Number: 09/08/2020 Date of Birth/Sex: Treating RN: [birth year] (85 y.o. [id number], [name] Primary Care Janus Vlcek: 01/08/1936 Other Clinician: Referring Sharese Manrique: Treating Kasyn Rolph/Extender: 97 in Treatment: 6 Edema Assessment Assessed: [name]: Yes] [name]: No] Edema: [Left: N] [Right: o] Calf Left: Right: Point of Measurement: 40 cm From Medial Instep 34 cm Ankle Left: Right: Point of Measurement: 10 cm From Medial Instep 21 cm Vascular Assessment Pulses: Dorsalis Pedis Palpable: [Left:Yes] Posterior Tibial Palpable: [Left:Yes] Electronic Signature(s) Signed: 09/07/2020 5:45:48 PM By: 09/09/2020 RN Entered By: [name] on 09/06/2020 13:55:56 -------------------------------------------------------------------------------- Multi-Disciplinary Care Plan Details Patient Name: Date of Service: PEA RMA N, DO Scott B. 09/06/2020 1:45 PM Medical Record Number: 09/08/2020 Patient Account Number: [id number] Date of Birth/Sex: Treating RN: Sep 01, 1935 (85 y.o. 97 Primary Care Missey Hasley: [name] Other Clinician: Referring Yee Joss: Treating Nickalous Stingley/Extender: [name] in Treatment:  6 Multidisciplinary Care Plan reviewed with physician Active Inactive Electronic Signature(s) Signed: 09/06/2020 5:38:32 PM By: Kyra Searles RN, BSN Entered By: Franne Forts on 09/06/2020 14:00:40 -------------------------------------------------------------------------------- Pain Assessment Details Patient Name: Date of Service: PEA RMA N, DO Scott B. 09/06/2020 1:45 PM Medical Record Number: [name] Patient Account Number: 09/08/2020 Date of Birth/Sex: Treating RN: 09-19-1935 (85 y.o. [id number], [name] Primary Care Farhiya Rosten: 01/08/1936 Other Clinician: Referring Demarcus Thielke: Treating Kaitlin Alcindor/Extender: 97  [name] Weeks in Treatment: 6 Active Problems Location of Pain Severity and Description of Pain Patient Has Paino No Site Locations Pain Management and Medication Current Pain Management: Electronic Signature(s) Signed: 09/07/2020 5:45:48 PM By: Scott Mu RN Entered By: [name] on 09/06/2020 13:55:47 -------------------------------------------------------------------------------- Patient/Caregiver Education Details Patient Name: Date of Service: PEA RMA N, DO Scott B. 4/13/2022andnbsp1:45 PM Medical Record Number: [id number] Patient Account Number: [id number] Date of Birth/Gender: Treating RN: [birth year] (85 y.o. [name] Primary Care Physician: [name] Other Clinician: Referring Physician: Treating Physician/Extender: [name] in Treatment: 6 Education Assessment Education Provided To: Patient Education Topics Provided Offloading: Methods: Explain/Verbal Responses: Reinforcements needed, State content correctly Wound/Skin Impairment: Methods: Explain/Verbal Responses: Reinforcements needed, State content correctly Electronic Signature(s) Signed: 09/06/2020 5:38:32 PM By: Zenaida Deed RN, BSN Entered By: Zenaida Deed on 09/06/2020  14:01:04 -------------------------------------------------------------------------------- Wound Assessment Details Patient Name: Date of Service: PEA RMA N, DO Scott B. 09/06/2020 1:45 PM Medical Record Number: [id number] Patient Account Number: [id number] Date of Birth/Sex: Treating RN: 1936/03/11 (85 y.o. [name], [name] Primary Care [place]ia Noori: [name] Other Clinician: Referring Tonya Wantz: Treating Taunia Frasco/Extender: [name] in Treatment: 6 Wound Status Wound Number: 1 Primary Etiology: Pressure Ulcer Wound Location: Left, Medial, Plantar Foot Wound Status: Open Wounding Event: Gradually Appeared Date Acquired: 06/27/2018 Weeks Of Treatment: 6 Clustered Wound: No Wound Measurements Length: (cm) Width: (cm) Depth: (cm) Area: (cm) Volume: (cm) 0 % Reduction in Area: 100% 0 % Reduction in Volume: 100% 0 0 0 Wound Description Classification: Category/Stage II Electronic Signature(s) Signed: 09/07/2020 5:45:48 PM By: Scott Mu RN Entered By: [name] on 09/06/2020 13:56:06 -------------------------------------------------------------------------------- Vitals Details Patient Name: Date of Service: PEA RMA N, DO Scott B. 09/06/2020 1:45 PM Medical Record Number: [id number] Patient Account Number: [id number] Date of Birth/Sex: Treating RN: Feb 16, 1936 (85 y.o. [name], [name] Primary Care Roque Schill: [name] Other Clinician: Referring Rhilyn Battle: Treating Kayl Stogdill/Extender: [name] in Treatment: 6 Vital Signs Time Taken: 13:55 Temperature (F): 98 Height (in): 75 Pulse (bpm): 78 Weight (lbs): 202 Respiratory Rate (breaths/min): 17 Body Mass Index (BMI): 25.2 Blood Pressure (mmHg): 132/78 Reference Range: 80 - 120 mg / dl Electronic Signature(s) Signed: 09/07/2020 5:45:48 PM By: Scott Mu RN Entered By: [name] on 09/06/2020 13:55:32

## 2022-12-10 ENCOUNTER — Other Ambulatory Visit: Payer: Self-pay

## 2022-12-10 ENCOUNTER — Emergency Department (HOSPITAL_COMMUNITY): Payer: Medicare HMO

## 2022-12-10 ENCOUNTER — Inpatient Hospital Stay (HOSPITAL_COMMUNITY)
Admission: EM | Admit: 2022-12-10 | Discharge: 2022-12-26 | DRG: 871 | Disposition: E | Payer: Medicare HMO | Attending: Internal Medicine | Admitting: Internal Medicine

## 2022-12-10 ENCOUNTER — Encounter (HOSPITAL_COMMUNITY): Payer: Self-pay

## 2022-12-10 DIAGNOSIS — M6282 Rhabdomyolysis: Secondary | ICD-10-CM | POA: Diagnosis present

## 2022-12-10 DIAGNOSIS — A419 Sepsis, unspecified organism: Secondary | ICD-10-CM

## 2022-12-10 DIAGNOSIS — E878 Other disorders of electrolyte and fluid balance, not elsewhere classified: Secondary | ICD-10-CM | POA: Diagnosis present

## 2022-12-10 DIAGNOSIS — N184 Chronic kidney disease, stage 4 (severe): Secondary | ICD-10-CM | POA: Diagnosis present

## 2022-12-10 DIAGNOSIS — J690 Pneumonitis due to inhalation of food and vomit: Secondary | ICD-10-CM | POA: Diagnosis present

## 2022-12-10 DIAGNOSIS — Z95810 Presence of automatic (implantable) cardiac defibrillator: Secondary | ICD-10-CM

## 2022-12-10 DIAGNOSIS — Z1152 Encounter for screening for COVID-19: Secondary | ICD-10-CM

## 2022-12-10 DIAGNOSIS — N401 Enlarged prostate with lower urinary tract symptoms: Secondary | ICD-10-CM | POA: Diagnosis present

## 2022-12-10 DIAGNOSIS — I2510 Atherosclerotic heart disease of native coronary artery without angina pectoris: Secondary | ICD-10-CM | POA: Diagnosis present

## 2022-12-10 DIAGNOSIS — Z791 Long term (current) use of non-steroidal anti-inflammatories (NSAID): Secondary | ICD-10-CM

## 2022-12-10 DIAGNOSIS — Z79899 Other long term (current) drug therapy: Secondary | ICD-10-CM

## 2022-12-10 DIAGNOSIS — K7200 Acute and subacute hepatic failure without coma: Secondary | ICD-10-CM | POA: Diagnosis present

## 2022-12-10 DIAGNOSIS — I7389 Other specified peripheral vascular diseases: Secondary | ICD-10-CM | POA: Diagnosis present

## 2022-12-10 DIAGNOSIS — R4182 Altered mental status, unspecified: Secondary | ICD-10-CM | POA: Diagnosis present

## 2022-12-10 DIAGNOSIS — I4819 Other persistent atrial fibrillation: Secondary | ICD-10-CM

## 2022-12-10 DIAGNOSIS — E871 Hypo-osmolality and hyponatremia: Secondary | ICD-10-CM | POA: Diagnosis not present

## 2022-12-10 DIAGNOSIS — E872 Acidosis, unspecified: Secondary | ICD-10-CM | POA: Diagnosis present

## 2022-12-10 DIAGNOSIS — N179 Acute kidney failure, unspecified: Secondary | ICD-10-CM | POA: Diagnosis present

## 2022-12-10 DIAGNOSIS — R310 Gross hematuria: Secondary | ICD-10-CM | POA: Diagnosis not present

## 2022-12-10 DIAGNOSIS — E1165 Type 2 diabetes mellitus with hyperglycemia: Secondary | ICD-10-CM | POA: Diagnosis present

## 2022-12-10 DIAGNOSIS — I472 Ventricular tachycardia, unspecified: Secondary | ICD-10-CM | POA: Diagnosis not present

## 2022-12-10 DIAGNOSIS — I2694 Multiple subsegmental pulmonary emboli without acute cor pulmonale: Secondary | ICD-10-CM | POA: Diagnosis present

## 2022-12-10 DIAGNOSIS — E860 Dehydration: Secondary | ICD-10-CM | POA: Diagnosis present

## 2022-12-10 DIAGNOSIS — R6521 Severe sepsis with septic shock: Secondary | ICD-10-CM | POA: Diagnosis present

## 2022-12-10 DIAGNOSIS — W19XXXA Unspecified fall, initial encounter: Principal | ICD-10-CM

## 2022-12-10 DIAGNOSIS — Z91138 Patient's unintentional underdosing of medication regimen for other reason: Secondary | ICD-10-CM

## 2022-12-10 DIAGNOSIS — Z888 Allergy status to other drugs, medicaments and biological substances status: Secondary | ICD-10-CM

## 2022-12-10 DIAGNOSIS — Z66 Do not resuscitate: Secondary | ICD-10-CM | POA: Diagnosis present

## 2022-12-10 DIAGNOSIS — J189 Pneumonia, unspecified organism: Secondary | ICD-10-CM

## 2022-12-10 DIAGNOSIS — A408 Other streptococcal sepsis: Principal | ICD-10-CM | POA: Diagnosis present

## 2022-12-10 DIAGNOSIS — J154 Pneumonia due to other streptococci: Secondary | ICD-10-CM | POA: Diagnosis present

## 2022-12-10 DIAGNOSIS — H9193 Unspecified hearing loss, bilateral: Secondary | ICD-10-CM | POA: Diagnosis present

## 2022-12-10 DIAGNOSIS — E1122 Type 2 diabetes mellitus with diabetic chronic kidney disease: Secondary | ICD-10-CM | POA: Diagnosis present

## 2022-12-10 DIAGNOSIS — I428 Other cardiomyopathies: Secondary | ICD-10-CM | POA: Diagnosis present

## 2022-12-10 DIAGNOSIS — Z8249 Family history of ischemic heart disease and other diseases of the circulatory system: Secondary | ICD-10-CM

## 2022-12-10 DIAGNOSIS — L89890 Pressure ulcer of other site, unstageable: Secondary | ICD-10-CM | POA: Diagnosis present

## 2022-12-10 DIAGNOSIS — R54 Age-related physical debility: Secondary | ICD-10-CM | POA: Diagnosis present

## 2022-12-10 DIAGNOSIS — I4821 Permanent atrial fibrillation: Secondary | ICD-10-CM | POA: Diagnosis present

## 2022-12-10 DIAGNOSIS — I4891 Unspecified atrial fibrillation: Secondary | ICD-10-CM | POA: Diagnosis not present

## 2022-12-10 DIAGNOSIS — Z87891 Personal history of nicotine dependence: Secondary | ICD-10-CM

## 2022-12-10 DIAGNOSIS — Z7901 Long term (current) use of anticoagulants: Secondary | ICD-10-CM

## 2022-12-10 DIAGNOSIS — Z515 Encounter for palliative care: Secondary | ICD-10-CM

## 2022-12-10 DIAGNOSIS — I454 Nonspecific intraventricular block: Secondary | ICD-10-CM | POA: Diagnosis present

## 2022-12-10 DIAGNOSIS — R338 Other retention of urine: Secondary | ICD-10-CM | POA: Diagnosis present

## 2022-12-10 DIAGNOSIS — J9601 Acute respiratory failure with hypoxia: Secondary | ICD-10-CM | POA: Diagnosis present

## 2022-12-10 DIAGNOSIS — I700 Atherosclerosis of aorta: Secondary | ICD-10-CM | POA: Diagnosis present

## 2022-12-10 DIAGNOSIS — E440 Moderate protein-calorie malnutrition: Secondary | ICD-10-CM | POA: Diagnosis present

## 2022-12-10 DIAGNOSIS — J440 Chronic obstructive pulmonary disease with acute lower respiratory infection: Secondary | ICD-10-CM | POA: Diagnosis present

## 2022-12-10 DIAGNOSIS — G9341 Metabolic encephalopathy: Secondary | ICD-10-CM | POA: Diagnosis present

## 2022-12-10 DIAGNOSIS — R001 Bradycardia, unspecified: Secondary | ICD-10-CM | POA: Diagnosis not present

## 2022-12-10 DIAGNOSIS — Z681 Body mass index (BMI) 19 or less, adult: Secondary | ICD-10-CM

## 2022-12-10 DIAGNOSIS — I5042 Chronic combined systolic (congestive) and diastolic (congestive) heart failure: Secondary | ICD-10-CM | POA: Diagnosis present

## 2022-12-10 DIAGNOSIS — E785 Hyperlipidemia, unspecified: Secondary | ICD-10-CM | POA: Diagnosis present

## 2022-12-10 DIAGNOSIS — D696 Thrombocytopenia, unspecified: Secondary | ICD-10-CM | POA: Diagnosis not present

## 2022-12-10 DIAGNOSIS — L89620 Pressure ulcer of left heel, unstageable: Secondary | ICD-10-CM | POA: Diagnosis present

## 2022-12-10 DIAGNOSIS — S0990XA Unspecified injury of head, initial encounter: Secondary | ICD-10-CM

## 2022-12-10 DIAGNOSIS — E870 Hyperosmolality and hypernatremia: Secondary | ICD-10-CM | POA: Diagnosis present

## 2022-12-10 DIAGNOSIS — T45516A Underdosing of anticoagulants, initial encounter: Secondary | ICD-10-CM | POA: Diagnosis present

## 2022-12-10 DIAGNOSIS — R1310 Dysphagia, unspecified: Secondary | ICD-10-CM | POA: Diagnosis not present

## 2022-12-10 DIAGNOSIS — H109 Unspecified conjunctivitis: Secondary | ICD-10-CM | POA: Diagnosis present

## 2022-12-10 DIAGNOSIS — Z8673 Personal history of transient ischemic attack (TIA), and cerebral infarction without residual deficits: Secondary | ICD-10-CM

## 2022-12-10 DIAGNOSIS — Z781 Physical restraint status: Secondary | ICD-10-CM

## 2022-12-10 DIAGNOSIS — I2489 Other forms of acute ischemic heart disease: Secondary | ICD-10-CM | POA: Diagnosis present

## 2022-12-10 DIAGNOSIS — I130 Hypertensive heart and chronic kidney disease with heart failure and stage 1 through stage 4 chronic kidney disease, or unspecified chronic kidney disease: Secondary | ICD-10-CM | POA: Diagnosis present

## 2022-12-10 DIAGNOSIS — E861 Hypovolemia: Secondary | ICD-10-CM | POA: Diagnosis present

## 2022-12-10 DIAGNOSIS — Z833 Family history of diabetes mellitus: Secondary | ICD-10-CM

## 2022-12-10 DIAGNOSIS — W1830XA Fall on same level, unspecified, initial encounter: Secondary | ICD-10-CM | POA: Diagnosis present

## 2022-12-10 HISTORY — DX: Atherosclerotic heart disease of native coronary artery without angina pectoris: I25.10

## 2022-12-10 HISTORY — DX: Essential (primary) hypertension: I10

## 2022-12-10 HISTORY — DX: Permanent atrial fibrillation: I48.21

## 2022-12-10 HISTORY — DX: Chronic obstructive pulmonary disease, unspecified: J44.9

## 2022-12-10 HISTORY — DX: Other cardiomyopathies: I42.8

## 2022-12-10 HISTORY — DX: Hyperlipidemia, unspecified: E78.5

## 2022-12-10 LAB — I-STAT CHEM 8, ED
BUN: 57 mg/dL — ABNORMAL HIGH (ref 8–23)
Calcium, Ion: 1.1 mmol/L — ABNORMAL LOW (ref 1.15–1.40)
Chloride: 119 mmol/L — ABNORMAL HIGH (ref 98–111)
Creatinine, Ser: 2.1 mg/dL — ABNORMAL HIGH (ref 0.61–1.24)
Glucose, Bld: 132 mg/dL — ABNORMAL HIGH (ref 70–99)
HCT: 42 % (ref 39.0–52.0)
Hemoglobin: 14.3 g/dL (ref 13.0–17.0)
Potassium: 5 mmol/L (ref 3.5–5.1)
Sodium: 155 mmol/L — ABNORMAL HIGH (ref 135–145)
TCO2: 24 mmol/L (ref 22–32)

## 2022-12-10 LAB — URINALYSIS, ROUTINE W REFLEX MICROSCOPIC
Bilirubin Urine: NEGATIVE
Glucose, UA: NEGATIVE mg/dL
Ketones, ur: NEGATIVE mg/dL
Leukocytes,Ua: NEGATIVE
Nitrite: NEGATIVE
Protein, ur: NEGATIVE mg/dL
Specific Gravity, Urine: 1.016 (ref 1.005–1.030)
pH: 5 (ref 5.0–8.0)

## 2022-12-10 LAB — I-STAT VENOUS BLOOD GAS, ED
Acid-Base Excess: 3 mmol/L — ABNORMAL HIGH (ref 0.0–2.0)
Bicarbonate: 28.1 mmol/L — ABNORMAL HIGH (ref 20.0–28.0)
Calcium, Ion: 1.11 mmol/L — ABNORMAL LOW (ref 1.15–1.40)
HCT: 42 % (ref 39.0–52.0)
Hemoglobin: 14.3 g/dL (ref 13.0–17.0)
O2 Saturation: 73 %
Potassium: 5 mmol/L (ref 3.5–5.1)
Sodium: 155 mmol/L — ABNORMAL HIGH (ref 135–145)
TCO2: 29 mmol/L (ref 22–32)
pCO2, Ven: 44.8 mmHg (ref 44–60)
pH, Ven: 7.405 (ref 7.25–7.43)
pO2, Ven: 39 mmHg (ref 32–45)

## 2022-12-10 LAB — CK: Total CK: 333 U/L (ref 49–397)

## 2022-12-10 LAB — COMPREHENSIVE METABOLIC PANEL
ALT: 173 U/L — ABNORMAL HIGH (ref 0–44)
AST: 327 U/L — ABNORMAL HIGH (ref 15–41)
Albumin: 3 g/dL — ABNORMAL LOW (ref 3.5–5.0)
Alkaline Phosphatase: 109 U/L (ref 38–126)
Anion gap: 17 — ABNORMAL HIGH (ref 5–15)
BUN: 44 mg/dL — ABNORMAL HIGH (ref 8–23)
CO2: 21 mmol/L — ABNORMAL LOW (ref 22–32)
Calcium: 8.8 mg/dL — ABNORMAL LOW (ref 8.9–10.3)
Chloride: 118 mmol/L — ABNORMAL HIGH (ref 98–111)
Creatinine, Ser: 2.15 mg/dL — ABNORMAL HIGH (ref 0.61–1.24)
GFR, Estimated: 30 mL/min — ABNORMAL LOW (ref >60)
Glucose, Bld: 134 mg/dL — ABNORMAL HIGH (ref 70–99)
Potassium: 4.6 mmol/L (ref 3.5–5.1)
Sodium: 156 mmol/L — ABNORMAL HIGH (ref 135–145)
Total Bilirubin: 3.9 mg/dL — ABNORMAL HIGH (ref 0.3–1.2)
Total Protein: 6.2 g/dL — ABNORMAL LOW (ref 6.5–8.1)

## 2022-12-10 LAB — CBC
HCT: 44.2 % (ref 39.0–52.0)
Hemoglobin: 14.1 g/dL (ref 13.0–17.0)
MCH: 31.5 pg (ref 26.0–34.0)
MCHC: 31.9 g/dL (ref 30.0–36.0)
MCV: 98.7 fL (ref 80.0–100.0)
Platelets: 179 10*3/uL (ref 150–400)
RBC: 4.48 MIL/uL (ref 4.22–5.81)
RDW: 16.1 % — ABNORMAL HIGH (ref 11.5–15.5)
WBC: 9.7 10*3/uL (ref 4.0–10.5)
nRBC: 0 % (ref 0.0–0.2)

## 2022-12-10 LAB — RESP PANEL BY RT-PCR (RSV, FLU A&B, COVID)  RVPGX2
Influenza A by PCR: NEGATIVE
Influenza B by PCR: NEGATIVE
Resp Syncytial Virus by PCR: NEGATIVE
SARS Coronavirus 2 by RT PCR: NEGATIVE

## 2022-12-10 LAB — BRAIN NATRIURETIC PEPTIDE: B Natriuretic Peptide: 1420.9 pg/mL — ABNORMAL HIGH (ref 0.0–100.0)

## 2022-12-10 LAB — PROTIME-INR
INR: 1.7 — ABNORMAL HIGH (ref 0.8–1.2)
Prothrombin Time: 19.7 seconds — ABNORMAL HIGH (ref 11.4–15.2)

## 2022-12-10 LAB — APTT: aPTT: 31 seconds (ref 24–36)

## 2022-12-10 LAB — LACTIC ACID, PLASMA: Lactic Acid, Venous: 4.1 mmol/L (ref 0.5–1.9)

## 2022-12-10 LAB — DIGOXIN LEVEL: Digoxin Level: <0.2 ng/mL — ABNORMAL LOW (ref 0.8–2.0)

## 2022-12-10 LAB — TROPONIN I (HIGH SENSITIVITY)
Troponin I (High Sensitivity): 103 ng/L (ref <18)
Troponin I (High Sensitivity): 85 ng/L — ABNORMAL HIGH (ref <18)

## 2022-12-10 LAB — MAGNESIUM: Magnesium: 2.2 mg/dL (ref 1.7–2.4)

## 2022-12-10 MED ORDER — AMIODARONE HCL IN DEXTROSE 360-4.14 MG/200ML-% IV SOLN
30.0000 mg/h | INTRAVENOUS | Status: DC
  Administered 2022-12-11 – 2022-12-14 (×6): 30 mg/h via INTRAVENOUS
  Filled 2022-12-10 (×7): qty 200

## 2022-12-10 MED ORDER — VANCOMYCIN HCL 1750 MG/350ML IV SOLN
1750.0000 mg | Freq: Once | INTRAVENOUS | Status: AC
  Administered 2022-12-10: 1750 mg via INTRAVENOUS
  Filled 2022-12-10: qty 350

## 2022-12-10 MED ORDER — METRONIDAZOLE 500 MG/100ML IV SOLN
500.0000 mg | Freq: Once | INTRAVENOUS | Status: AC
  Administered 2022-12-10: 500 mg via INTRAVENOUS
  Filled 2022-12-10: qty 100

## 2022-12-10 MED ORDER — IOHEXOL 350 MG/ML SOLN
75.0000 mL | Freq: Once | INTRAVENOUS | Status: AC | PRN
  Administered 2022-12-10: 75 mL via INTRAVENOUS

## 2022-12-10 MED ORDER — SODIUM CHLORIDE 0.9 % IV SOLN
2.0000 g | Freq: Once | INTRAVENOUS | Status: AC
  Administered 2022-12-10: 2 g via INTRAVENOUS
  Filled 2022-12-10 (×2): qty 12.5

## 2022-12-10 MED ORDER — AMIODARONE LOAD VIA INFUSION
150.0000 mg | Freq: Once | INTRAVENOUS | Status: AC
  Administered 2022-12-10: 150 mg via INTRAVENOUS
  Filled 2022-12-10: qty 83.34

## 2022-12-10 MED ORDER — VANCOMYCIN HCL 1500 MG/300ML IV SOLN
1500.0000 mg | Freq: Once | INTRAVENOUS | Status: DC
  Filled 2022-12-10: qty 300

## 2022-12-10 MED ORDER — AMIODARONE HCL IN DEXTROSE 360-4.14 MG/200ML-% IV SOLN
60.0000 mg/h | INTRAVENOUS | Status: AC
  Administered 2022-12-10: 60 mg/h via INTRAVENOUS
  Filled 2022-12-10: qty 200

## 2022-12-10 MED ORDER — AMIODARONE HCL IN DEXTROSE 360-4.14 MG/200ML-% IV SOLN
INTRAVENOUS | Status: AC
  Administered 2022-12-10: 60 mg/h via INTRAVENOUS
  Filled 2022-12-10: qty 200

## 2022-12-10 MED ORDER — HEPARIN (PORCINE) 25000 UT/250ML-% IV SOLN
1300.0000 [IU]/h | INTRAVENOUS | Status: DC
  Administered 2022-12-10: 1300 [IU]/h via INTRAVENOUS
  Filled 2022-12-10: qty 250

## 2022-12-10 MED ORDER — LACTATED RINGERS IV BOLUS
1000.0000 mL | Freq: Once | INTRAVENOUS | Status: AC
  Administered 2022-12-10: 1000 mL via INTRAVENOUS

## 2022-12-10 MED ORDER — VANCOMYCIN HCL IN DEXTROSE 1-5 GM/200ML-% IV SOLN
1000.0000 mg | Freq: Once | INTRAVENOUS | Status: DC
  Filled 2022-12-10: qty 200

## 2022-12-10 NOTE — Sepsis Progress Note (Signed)
Sepsis protocol is being followed by [facility]. 

## 2022-12-10 NOTE — Progress Notes (Signed)
   12/10/22 1940  Spiritual Encounters  Type of Visit Initial  Care provided to: Pt not available  Referral source Trauma page  Reason for visit Trauma  OnCall Visit No   Chaplain responded to a level two trauma. The patient was attended to by the medical team. No family is present. If a chaplain is requested someone will respond.   Valerie Roys [facility]  [phone]

## 2022-12-10 NOTE — Progress Notes (Signed)
Orthopedic Tech Progress Note Patient Details:  [name] 05/11/1936 [id number]  Patient ID: [name], male   DOB: February 15, 1936, 87 y.o.   MRN: [id number] Level 2 trauma.  Al Decant 12/10/2022, 7:34 PM

## 2022-12-10 NOTE — Progress Notes (Signed)
The patient is an 87 year old male with past medical history significant for nonischemic cardiomyopathy, chronic HFrEF, permanent atrial fibrillation on Eliquis, history of biventricular ICD placement, hypertension, hyperlipidemia, COPD, who presents via EMS after being found down by neighbors after 2 days of not being seen.  Upon EMS arrival the patient was hypoxic with oxygen saturation of 85% on room air.  Was placed on nonrebreather and weaned down to 4 L nasal cannula.  Presented to the ED as a level 2 trauma.  In the ED, febrile with Tmax 100.7, soft BPs, tachycardic, in A-fib with RVR, tachypneic respiration rate of 24.  Code sepsis was called in the ED.  Broad-spectrum IV antibiotics were initiated, the patient received IV vancomycin, IV Flagyl and cefepime.  Started on amiodarone drip due to A-fib with RVR.  Lactic acid 4.1.  On heparin drip due to pulmonary embolism seen on CT angio.  Noncontrast head CT was nonacute.  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  No acute fracture or static subluxation of the cervical spine.  CT angio chest revealed peripheral and lower lobe pulmonary emboli.  Airspace disease in the right upper lung, likely pneumonia.  Diffuse emphysematous changes in the lungs.  Chronic bronchitic changes.  Aortic atherosclerosis.  Prostate gland is enlarged.  Heterogeneous related nephrograms bilaterally likely indicate pyelonephritis.  His UA was negative for pyuria.  Lab studies notable for hypernatremia serum sodium of 155, elevated creatinine 2.10 with normal baseline, elevated liver chemistries AST, ALT, T bilirubin 3.9.  Elevated BNP greater than 1400.  High-sensitivity troponin 103, repeat 85.  Lactic acid 4.1.  Serum bicarb 21, anion gap 17.  Acute urinary retention greater than 999cc, indwelling foley catheter placed 12/10/2022.  On exam the patient is on 2 point wrists restraint, frail-appearing, somnolent but easily arousable to voices.   Confused. Tachycardic with irregular rate and rhythm. Diffuse rales bilaterally. Trace lower extremity edema bilaterally. Judgment is altered.  Mood is appropriate.  The patient has multiorgan involvement and requires close monitoring.  Per charge RN current nurse to patient ratio in the stepdown unit is 5 out of 1.  The patient may benefit from stabilization in the ICU prior to transfer to stepdown unit.   Time: 35 minutes.

## 2022-12-10 NOTE — ED Triage Notes (Addendum)
Patient BIB [facility] from home with c/o unwitnessed fall x 2 days ago  has not been able to get up.  Patient found by neighbors on the ground.  Upon EMS arrival patients initial O2 85% RA; patient placed on non re breather then weaned down by EMS to 4L [place].  +blood thinners  18g R AC EMS administered 700 mL NS  CBG 185

## 2022-12-10 NOTE — Sepsis Progress Note (Addendum)
Notified Provider and bedside nurse of need for blood culture and lactic acid order per sepsis protocol.  @2015  - Provider responded that he only wanted lactic acid and will bolus fluid but not 4ml/kg.

## 2022-12-10 NOTE — Progress Notes (Signed)
ANTICOAGULATION CONSULT NOTE - Initial Consult  Pharmacy Consult for Heparin infusion Indication: pulmonary embolus  Allergies  Allergen Reactions   Lisinopril    Prednisone     psychosis   Glucosamine Rash    Patient Measurements: Height: 6\' 3"  (190.5 cm) Weight: 77.1 kg (170 lb) IBW/kg (Calculated) : 84.5 Heparin Dosing Weight: 77.1 kg  Vital Signs: Temp: 100.7 F (38.2 C) (07/16 1910) Temp Source: Rectal (07/16 1910) BP: 116/71 (07/16 2300) Pulse Rate: 127 (07/16 2300)  Labs: Recent Labs    12/10/22 1928 12/10/22 1938  HGB 14.1 14.3  14.3  HCT 44.2 42.0  42.0  PLT 179  --   APTT 31  --   LABPROT 19.7*  --   INR 1.7*  --   CREATININE 2.15* 2.10*  CKTOTAL 333  --   TROPONINIHS 103*  --     Estimated Creatinine Clearance: 28.6 mL/min (A) (by C-G formula based on SCr of 2.1 mg/dL (H)).   Medical History: History reviewed. No pertinent past medical history.  Medications:  (Not in a hospital admission)   Assessment: 87 yo M BIB EMS with AMS after being found down at his home by neighbors. CT PE is positive for peripheral lower lobe pulmonary emboli. Patient was taking apixaban 5mg  po BID prior to admission for history of AFib, however we are unable to obtain an accurate history of his recent medication administration and last dose is unknown. Pharmacy has been consulted to dose heparin infusion for PE.   Hgb 14.3, Plt 179 aPTT 31, INR 1.7  No s/sx of bleeding  Goal of Therapy:  Heparin level 0.3-0.7 units/ml Monitor platelets by anticoagulation protocol: Yes   Plan:  No bolus of heparin given unknown last dose of apixaban Start heparin infusion at 1300 units/hr Check baseline aPTT and heparin level Check heparin level in 8 hours Monitor daily CBC, aPTT and heparin level (until correlating due to possible recent apixaban administration), and for s/sx of bleeding   Wilburn Cornelia, PharmD, BCPS Clinical Pharmacist 12/10/2022 11:24 PM   Please refer  to [facility] for pharmacy phone number

## 2022-12-10 NOTE — Progress Notes (Signed)
ED Pharmacy Antibiotic Sign Off An antibiotic consult was received from an ED provider for Cefepime and Vancomycin per pharmacy dosing for Sepsis. A chart review was completed to assess appropriateness.   The following one time order(s) were placed:  Cefepime 2g IV x1 Vancomycin 1750mg  IV x1  Further antibiotic and/or antibiotic pharmacy consults should be ordered by the admitting provider if indicated.   Thank you for allowing pharmacy to be a part of this patient's care.   [name], PharmD, BCPS Clinical Pharmacist 12/10/2022 8:41 PM   Please refer to [facility] for pharmacy phone number

## 2022-12-10 NOTE — ED Notes (Signed)
EMS did not place C collar on patient for transport.

## 2022-12-10 NOTE — ED Notes (Signed)
Patient log rolled to assess backside. Patient found to have: stage 1 bed sore on sacrum; stage 1 bed sore between shoulder blades, stage 1 bed sore bilateral feet, abrasion to left chin.   Patient bladder visible distended, bladder scan performed, >999 mL in bladder. MD notified. Received order to place foley cath.

## 2022-12-10 NOTE — ED Provider Notes (Signed)
EMERGENCY DEPARTMENT AT [facility] Provider Note   CSN: [id number] Arrival date & time: 12/10/22  1900     History {Add pertinent medical, surgical, social history, OB history to HPI:1} Chief Complaint  Patient presents with   [name] is a 87 y.o. male.  87 year old male with a history of nonischemic cardiomyopathy (EF 40 to 45%), CKD stage IV, atrial fibrillation on anticoagulation and digoxin, and pacemaker/ICD ([employer]) placement who presents emergency department after being found down.  Neighbors not seen the patient for several days and found him on the ground.  Called 911.  Was hypoxic requiring 4 L nasal cannula.  Patient was initially alert and oriented x 3 but now is only alert and oriented to self.  EMS reports that he is on Coumadin but based on notes appears that he was taking Eliquis.       Home Medications Prior to Admission medications   Medication Sig Start Date End Date Taking? Authorizing Provider  apixaban (ELIQUIS) 5 MG TABS tablet TAKE 1 TABLET BY MOUTH TWICE DAILY 03/10/17   [provider]  atorvastatin (LIPITOR) 80 MG tablet TAKE 1/2 TABLET BY MOUTH EVERY DAY FOR CHOLESTEROL 03/20/18   [provider]  b complex vitamins tablet Take by mouth.    [provider]  Cyanocobalamin 1000 MCG CAPS Take by mouth.    [provider]  digoxin (LANOXIN) 0.125 MG tablet TAKE 1 TABLET BY MOUTH EVERY DAY 02/13/18   [provider]  digoxin (LANOXIN) 0.125 MG tablet TAKE 1 TABLET BY MOUTH EVERY DAY 01/27/18   [provider]  furosemide (LASIX) 20 MG tablet TAKE 1 TABLET BY MOUTH EVERY DAY 02/16/18   [provider]  gentamicin cream (GARAMYCIN) 0.1 % Apply 1 application topically 2 (two) times daily. 04/22/18   Felecia Shelling, DPM  meloxicam (MOBIC) 15 MG tablet Take by mouth. 10/13/17   [provider]  metoprolol succinate (TOPROL-XL) 100 MG 24 hr tablet TAKE 1  TABLET BY MOUTH TWICE A DAY 10/13/17   [provider]  sacubitril-valsartan (ENTRESTO) 24-26 MG TAKE 1 TABLET BY MOUTH TWICE A DAY 03/26/18   [provider]  Saw Palmetto 1000 MG CAPS Take by mouth.    [provider]  sildenafil (VIAGRA) 100 MG tablet Take by mouth. 10/14/17   [provider]  tadalafil (ADCIRCA/CIALIS) 20 MG tablet Take by mouth.    [provider]  tamsulosin (FLOMAX) 0.4 MG CAPS capsule Take 0.4 mg by mouth daily. 01/16/18   [provider]  vitamin C (ASCORBIC ACID) 500 MG tablet Take by mouth.    [provider]  vitamin E 100 UNIT capsule Take by mouth.    [provider]      Allergies    Lisinopril, Prednisone, and Glucosamine    Review of Systems   Review of Systems  Physical Exam Updated Vital Signs SpO2 98%  Physical Exam Constitutional:      General: He is in acute distress.     Appearance: Normal appearance. He is ill-appearing.     Comments: On 4 L nasal cannula.  Alert and oriented to self only.  HENT:     Head: Normocephalic and atraumatic.     Right Ear: External ear normal.     Left Ear: External ear normal.     Mouth/Throat:     Mouth: Mucous membranes are moist.     Pharynx: Oropharynx is  clear.  Eyes:     Extraocular Movements: Extraocular movements intact.     Conjunctiva/sclera: Conjunctivae normal.     Pupils: Pupils are equal, round, and reactive to light.  Neck:     Comments: No C-spine midline tenderness to palpation Cardiovascular:     Rate and Rhythm: Regular rhythm. Tachycardia present.     Pulses: Normal pulses.     Heart sounds: Normal heart sounds.  Pulmonary:     Effort: Pulmonary effort is normal. No respiratory distress.     Breath sounds: Normal breath sounds.     Comments: 4 L nasal cannula Abdominal:     General: Abdomen is flat. Bowel sounds are normal. There is distension.     Palpations: Abdomen is soft. There is mass.     Tenderness: There  is no abdominal tenderness. There is no guarding.  Genitourinary:    Comments: Stage I sacral pressure ulcer Musculoskeletal:        General: No deformity. Normal range of motion.     Cervical back: No rigidity or tenderness.     Comments: No tenderness to palpation of midline thoracic or lumbar spine.  No step-offs palpated.  No tenderness to palpation of chest wall.  No bruising noted.  No tenderness to palpation of bilateral clavicles.  No tenderness to palpation, bruising, or deformities noted of bilateral shoulders, elbows, wrists, hips, knees, or ankles.  Does have ulcerations to bilateral soles of the feet  Neurological:     General: No focal deficit present.     Mental Status: He is alert.     Comments: Appears to be moving all 4 extremities.  Cranial nerves appear grossly intact.  Unable to cooperate with neuroexam otherwise.     ED Results / Procedures / Treatments   Labs (all labs ordered are listed, but only abnormal results are displayed) Labs Reviewed - No data to display  EKG None  Radiology No results found.  Procedures Procedures  {Document cardiac monitor, telemetry assessment procedure when appropriate:1}  Medications Ordered in ED Medications - No data to display  ED Course/ Medical Decision Making/ A&P   {   Click here for ABCD2, HEART and other calculatorsREFRESH Note before signing :1}                          Medical Decision Making Amount and/or Complexity of Data Reviewed Labs: ordered. Radiology: ordered.  Risk Prescription drug management. Decision regarding hospitalization.   ***  {Document critical care time when appropriate:1} {Document review of labs and clinical decision tools ie heart score, Chads2Vasc2 etc:1}  {Document your independent review of radiology images, and any outside records:1} {Document your discussion with family members, caretakers, and with consultants:1} {Document social determinants of health affecting  pt's care:1} {Document your decision making why or why not admission, treatments were needed:1} Final Clinical Impression(s) / ED Diagnoses Final diagnoses:  None    Rx / DC Orders ED Discharge Orders     None

## 2022-12-11 ENCOUNTER — Encounter (HOSPITAL_COMMUNITY): Payer: Self-pay | Admitting: Pulmonary Disease

## 2022-12-11 ENCOUNTER — Inpatient Hospital Stay (HOSPITAL_COMMUNITY): Payer: Medicare HMO

## 2022-12-11 DIAGNOSIS — Z1152 Encounter for screening for COVID-19: Secondary | ICD-10-CM | POA: Diagnosis not present

## 2022-12-11 DIAGNOSIS — J690 Pneumonitis due to inhalation of food and vomit: Secondary | ICD-10-CM | POA: Diagnosis present

## 2022-12-11 DIAGNOSIS — I5042 Chronic combined systolic (congestive) and diastolic (congestive) heart failure: Secondary | ICD-10-CM | POA: Diagnosis present

## 2022-12-11 DIAGNOSIS — J440 Chronic obstructive pulmonary disease with acute lower respiratory infection: Secondary | ICD-10-CM | POA: Diagnosis present

## 2022-12-11 DIAGNOSIS — A408 Other streptococcal sepsis: Secondary | ICD-10-CM | POA: Diagnosis present

## 2022-12-11 DIAGNOSIS — I130 Hypertensive heart and chronic kidney disease with heart failure and stage 1 through stage 4 chronic kidney disease, or unspecified chronic kidney disease: Secondary | ICD-10-CM | POA: Diagnosis present

## 2022-12-11 DIAGNOSIS — A419 Sepsis, unspecified organism: Secondary | ICD-10-CM | POA: Diagnosis not present

## 2022-12-11 DIAGNOSIS — R6521 Severe sepsis with septic shock: Secondary | ICD-10-CM

## 2022-12-11 DIAGNOSIS — R7989 Other specified abnormal findings of blood chemistry: Secondary | ICD-10-CM

## 2022-12-11 DIAGNOSIS — G9340 Encephalopathy, unspecified: Secondary | ICD-10-CM | POA: Diagnosis not present

## 2022-12-11 DIAGNOSIS — I330 Acute and subacute infective endocarditis: Secondary | ICD-10-CM | POA: Diagnosis not present

## 2022-12-11 DIAGNOSIS — I2694 Multiple subsegmental pulmonary emboli without acute cor pulmonale: Secondary | ICD-10-CM | POA: Diagnosis present

## 2022-12-11 DIAGNOSIS — J189 Pneumonia, unspecified organism: Secondary | ICD-10-CM | POA: Diagnosis not present

## 2022-12-11 DIAGNOSIS — J9601 Acute respiratory failure with hypoxia: Secondary | ICD-10-CM | POA: Diagnosis present

## 2022-12-11 DIAGNOSIS — I4819 Other persistent atrial fibrillation: Secondary | ICD-10-CM | POA: Diagnosis not present

## 2022-12-11 DIAGNOSIS — G9341 Metabolic encephalopathy: Secondary | ICD-10-CM | POA: Diagnosis present

## 2022-12-11 DIAGNOSIS — E871 Hypo-osmolality and hyponatremia: Secondary | ICD-10-CM | POA: Diagnosis not present

## 2022-12-11 DIAGNOSIS — I4891 Unspecified atrial fibrillation: Secondary | ICD-10-CM | POA: Diagnosis present

## 2022-12-11 DIAGNOSIS — I428 Other cardiomyopathies: Secondary | ICD-10-CM | POA: Diagnosis present

## 2022-12-11 DIAGNOSIS — Z66 Do not resuscitate: Secondary | ICD-10-CM | POA: Diagnosis present

## 2022-12-11 DIAGNOSIS — N184 Chronic kidney disease, stage 4 (severe): Secondary | ICD-10-CM | POA: Diagnosis present

## 2022-12-11 DIAGNOSIS — E1122 Type 2 diabetes mellitus with diabetic chronic kidney disease: Secondary | ICD-10-CM | POA: Diagnosis present

## 2022-12-11 DIAGNOSIS — K7200 Acute and subacute hepatic failure without coma: Secondary | ICD-10-CM | POA: Diagnosis present

## 2022-12-11 DIAGNOSIS — R4182 Altered mental status, unspecified: Secondary | ICD-10-CM | POA: Diagnosis not present

## 2022-12-11 DIAGNOSIS — I7389 Other specified peripheral vascular diseases: Secondary | ICD-10-CM | POA: Diagnosis present

## 2022-12-11 DIAGNOSIS — I2489 Other forms of acute ischemic heart disease: Secondary | ICD-10-CM | POA: Diagnosis present

## 2022-12-11 DIAGNOSIS — I4821 Permanent atrial fibrillation: Secondary | ICD-10-CM | POA: Diagnosis present

## 2022-12-11 DIAGNOSIS — Z515 Encounter for palliative care: Secondary | ICD-10-CM | POA: Diagnosis not present

## 2022-12-11 DIAGNOSIS — I472 Ventricular tachycardia, unspecified: Secondary | ICD-10-CM | POA: Diagnosis not present

## 2022-12-11 DIAGNOSIS — W1830XA Fall on same level, unspecified, initial encounter: Secondary | ICD-10-CM | POA: Diagnosis present

## 2022-12-11 DIAGNOSIS — J154 Pneumonia due to other streptococci: Secondary | ICD-10-CM | POA: Diagnosis present

## 2022-12-11 DIAGNOSIS — E440 Moderate protein-calorie malnutrition: Secondary | ICD-10-CM | POA: Diagnosis present

## 2022-12-11 DIAGNOSIS — E872 Acidosis, unspecified: Secondary | ICD-10-CM | POA: Diagnosis present

## 2022-12-11 LAB — COMPREHENSIVE METABOLIC PANEL
ALT: 196 U/L — ABNORMAL HIGH (ref 0–44)
ALT: 231 U/L — ABNORMAL HIGH (ref 0–44)
AST: 333 U/L — ABNORMAL HIGH (ref 15–41)
AST: 376 U/L — ABNORMAL HIGH (ref 15–41)
Albumin: 2.8 g/dL — ABNORMAL LOW (ref 3.5–5.0)
Albumin: 2.9 g/dL — ABNORMAL LOW (ref 3.5–5.0)
Alkaline Phosphatase: 120 U/L (ref 38–126)
Alkaline Phosphatase: 121 U/L (ref 38–126)
Anion gap: 14 (ref 5–15)
Anion gap: 13 (ref 5–15)
BUN: 44 mg/dL — ABNORMAL HIGH (ref 8–23)
BUN: 44 mg/dL — ABNORMAL HIGH (ref 8–23)
CO2: 22 mmol/L (ref 22–32)
CO2: 21 mmol/L — ABNORMAL LOW (ref 22–32)
Calcium: 8.6 mg/dL — ABNORMAL LOW (ref 8.9–10.3)
Calcium: 8.6 mg/dL — ABNORMAL LOW (ref 8.9–10.3)
Chloride: 114 mmol/L — ABNORMAL HIGH (ref 98–111)
Chloride: 118 mmol/L — ABNORMAL HIGH (ref 98–111)
Creatinine, Ser: 1.85 mg/dL — ABNORMAL HIGH (ref 0.61–1.24)
Creatinine, Ser: 2.04 mg/dL — ABNORMAL HIGH (ref 0.61–1.24)
GFR, Estimated: 35 mL/min — ABNORMAL LOW (ref >60)
GFR, Estimated: 32 mL/min — ABNORMAL LOW (ref >60)
Glucose, Bld: 167 mg/dL — ABNORMAL HIGH (ref 70–99)
Glucose, Bld: 165 mg/dL — ABNORMAL HIGH (ref 70–99)
Potassium: 4.1 mmol/L (ref 3.5–5.1)
Potassium: 4.3 mmol/L (ref 3.5–5.1)
Sodium: 150 mmol/L — ABNORMAL HIGH (ref 135–145)
Sodium: 152 mmol/L — ABNORMAL HIGH (ref 135–145)
Total Bilirubin: 3.3 mg/dL — ABNORMAL HIGH (ref 0.3–1.2)
Total Bilirubin: 3.1 mg/dL — ABNORMAL HIGH (ref 0.3–1.2)
Total Protein: 5.9 g/dL — ABNORMAL LOW (ref 6.5–8.1)
Total Protein: 6.1 g/dL — ABNORMAL LOW (ref 6.5–8.1)

## 2022-12-11 LAB — CBC
HCT: 46.5 % (ref 39.0–52.0)
HCT: 47.8 % (ref 39.0–52.0)
Hemoglobin: 14.4 g/dL (ref 13.0–17.0)
Hemoglobin: 14.5 g/dL (ref 13.0–17.0)
MCH: 31.8 pg (ref 26.0–34.0)
MCH: 31 pg (ref 26.0–34.0)
MCHC: 31 g/dL (ref 30.0–36.0)
MCHC: 30.3 g/dL (ref 30.0–36.0)
MCV: 102.6 fL — ABNORMAL HIGH (ref 80.0–100.0)
MCV: 102.1 fL — ABNORMAL HIGH (ref 80.0–100.0)
Platelets: 145 10*3/uL — ABNORMAL LOW (ref 150–400)
Platelets: 155 10*3/uL (ref 150–400)
RBC: 4.53 MIL/uL (ref 4.22–5.81)
RBC: 4.68 MIL/uL (ref 4.22–5.81)
RDW: 16.3 % — ABNORMAL HIGH (ref 11.5–15.5)
RDW: 16.2 % — ABNORMAL HIGH (ref 11.5–15.5)
WBC: 11.3 10*3/uL — ABNORMAL HIGH (ref 4.0–10.5)
WBC: 11.7 10*3/uL — ABNORMAL HIGH (ref 4.0–10.5)
nRBC: 0 % (ref 0.0–0.2)
nRBC: 0 % (ref 0.0–0.2)

## 2022-12-11 LAB — BLOOD GAS, VENOUS
Acid-base deficit: 0.3 mmol/L (ref 0.0–2.0)
Bicarbonate: 27.2 mmol/L (ref 20.0–28.0)
O2 Saturation: 45.3 %
Patient temperature: 37
pCO2, Ven: 54 mmHg (ref 44–60)
pH, Ven: 7.31 (ref 7.25–7.43)
pO2, Ven: 32 mmHg (ref 32–45)

## 2022-12-11 LAB — I-STAT VENOUS BLOOD GAS, ED
Acid-base deficit: 5 mmol/L — ABNORMAL HIGH (ref 0.0–2.0)
Bicarbonate: 21.9 mmol/L (ref 20.0–28.0)
Calcium, Ion: 1 mmol/L — ABNORMAL LOW (ref 1.15–1.40)
HCT: 45 % (ref 39.0–52.0)
Hemoglobin: 15.3 g/dL (ref 13.0–17.0)
O2 Saturation: 50 %
Potassium: 4.2 mmol/L (ref 3.5–5.1)
Sodium: 152 mmol/L — ABNORMAL HIGH (ref 135–145)
TCO2: 23 mmol/L (ref 22–32)
pCO2, Ven: 45.7 mmHg (ref 44–60)
pH, Ven: 7.289 (ref 7.25–7.43)
pO2, Ven: 30 mmHg — CL (ref 32–45)

## 2022-12-11 LAB — APTT: aPTT: 33 seconds (ref 24–36)

## 2022-12-11 LAB — URINALYSIS, W/ REFLEX TO CULTURE (INFECTION SUSPECTED)
RBC / HPF: >50 RBC/hpf (ref 0–5)
Squamous Epithelial / HPF: NONE SEEN /HPF (ref 0–5)

## 2022-12-11 LAB — CBG MONITORING, ED
Glucose-Capillary: 155 mg/dL — ABNORMAL HIGH (ref 70–99)
Glucose-Capillary: 159 mg/dL — ABNORMAL HIGH (ref 70–99)
Glucose-Capillary: 132 mg/dL — ABNORMAL HIGH (ref 70–99)

## 2022-12-11 LAB — HEMOGLOBIN AND HEMATOCRIT, BLOOD
HCT: 50 % (ref 39.0–52.0)
Hemoglobin: 15.5 g/dL (ref 13.0–17.0)

## 2022-12-11 LAB — ECHOCARDIOGRAM COMPLETE
Height: 75 in
Weight: 2720 oz

## 2022-12-11 LAB — LACTIC ACID, PLASMA
Lactic Acid, Venous: 2.6 mmol/L (ref 0.5–1.9)
Lactic Acid, Venous: 3.5 mmol/L (ref 0.5–1.9)
Lactic Acid, Venous: 4.5 mmol/L (ref 0.5–1.9)

## 2022-12-11 LAB — HEMOGLOBIN A1C
Hgb A1c MFr Bld: 5.6 % (ref 4.8–5.6)
Mean Plasma Glucose: 114.02 mg/dL

## 2022-12-11 LAB — HEPARIN LEVEL (UNFRACTIONATED)
Heparin Unfractionated: 0.45 IU/mL (ref 0.30–0.70)
Heparin Unfractionated: <0.1 IU/mL — ABNORMAL LOW (ref 0.30–0.70)

## 2022-12-11 LAB — AMMONIA: Ammonia: 20 umol/L (ref 9–35)

## 2022-12-11 LAB — SODIUM: Sodium: 150 mmol/L — ABNORMAL HIGH (ref 135–145)

## 2022-12-11 LAB — GLUCOSE, CAPILLARY: Glucose-Capillary: 170 mg/dL — ABNORMAL HIGH (ref 70–99)

## 2022-12-11 LAB — MRSA NEXT GEN BY PCR, NASAL: MRSA by PCR Next Gen: NOT DETECTED

## 2022-12-11 MED ORDER — METRONIDAZOLE 500 MG/100ML IV SOLN
500.0000 mg | Freq: Two times a day (BID) | INTRAVENOUS | Status: DC
  Administered 2022-12-11 (×2): 500 mg via INTRAVENOUS
  Filled 2022-12-11 (×3): qty 100

## 2022-12-11 MED ORDER — METOPROLOL TARTRATE 5 MG/5ML IV SOLN
5.0000 mg | Freq: Four times a day (QID) | INTRAVENOUS | Status: DC
  Administered 2022-12-11 – 2022-12-13 (×8): 5 mg via INTRAVENOUS
  Filled 2022-12-11 (×8): qty 5

## 2022-12-11 MED ORDER — VANCOMYCIN HCL 1750 MG/350ML IV SOLN: 1750.0000 mg | INTRAVENOUS | Status: DC

## 2022-12-11 MED ORDER — SODIUM CHLORIDE 0.9 % IV BOLUS
250.0000 mL | INTRAVENOUS | Status: AC
  Administered 2022-12-11: 250 mL via INTRAVENOUS

## 2022-12-11 MED ORDER — NOREPINEPHRINE 4 MG/250ML-% IV SOLN: 0.0000 ug/min | INTRAVENOUS | Status: DC

## 2022-12-11 MED ORDER — VANCOMYCIN HCL 1250 MG/250ML IV SOLN
1250.0000 mg | INTRAVENOUS | Status: DC
  Filled 2022-12-11: qty 250

## 2022-12-11 MED ORDER — DEXTROSE-SODIUM CHLORIDE 5-0.45 % IV SOLN: INTRAVENOUS | Status: DC

## 2022-12-11 MED ORDER — PROCHLORPERAZINE EDISYLATE 10 MG/2ML IJ SOLN: 5.0000 mg | Freq: Four times a day (QID) | INTRAMUSCULAR | Status: DC | PRN

## 2022-12-11 MED ORDER — INSULIN ASPART 100 UNIT/ML IJ SOLN: 0.0000 [IU] | INTRAMUSCULAR | Status: DC

## 2022-12-11 MED ORDER — MORPHINE SULFATE (PF) 2 MG/ML IV SOLN
2.0000 mg | Freq: Once | INTRAVENOUS | Status: AC
  Administered 2022-12-11: 2 mg via INTRAVENOUS
  Filled 2022-12-11: qty 1

## 2022-12-11 MED ORDER — SODIUM CHLORIDE 0.9 % IV SOLN
2.0000 g | INTRAVENOUS | Status: DC
  Filled 2022-12-11: qty 12.5

## 2022-12-11 MED ORDER — SODIUM CHLORIDE 0.45 % IV SOLN: INTRAVENOUS | Status: DC

## 2022-12-11 MED ORDER — SODIUM CHLORIDE 0.9 % IV BOLUS: 500.0000 mL | INTRAVENOUS | Status: DC

## 2022-12-11 MED ORDER — DIGOXIN 0.25 MG/ML IJ SOLN
0.1250 mg | Freq: Every day | INTRAMUSCULAR | Status: DC
  Administered 2022-12-11 – 2022-12-13 (×4): 0.125 mg via INTRAVENOUS
  Filled 2022-12-11 (×2): qty 0.5
  Filled 2022-12-11: qty 2
  Filled 2022-12-11 (×2): qty 0.5
  Filled 2022-12-11: qty 2

## 2022-12-11 MED ORDER — LACTATED RINGERS IV BOLUS
1000.0000 mL | Freq: Once | INTRAVENOUS | Status: AC
  Administered 2022-12-11: 1000 mL via INTRAVENOUS

## 2022-12-11 MED ORDER — SODIUM CHLORIDE 0.9 % IV SOLN
2.0000 g | Freq: Two times a day (BID) | INTRAVENOUS | Status: DC
  Administered 2022-12-11: 2 g via INTRAVENOUS
  Filled 2022-12-11: qty 12.5

## 2022-12-11 MED ORDER — LACTATED RINGERS IV SOLN: INTRAVENOUS | Status: DC

## 2022-12-11 MED ORDER — POLYMYXIN B-TRIMETHOPRIM 10000-0.1 UNIT/ML-% OP SOLN
2.0000 [drp] | OPHTHALMIC | Status: DC
  Administered 2022-12-11 – 2022-12-15 (×20): 2 [drp] via OPHTHALMIC
  Filled 2022-12-11 (×2): qty 10

## 2022-12-11 NOTE — ED Notes (Signed)
Warm blankets given to pt at this time.   °

## 2022-12-11 NOTE — Progress Notes (Addendum)
Pharmacy Antibiotic Note  [name] is a 87 y.o. male admitted on 12/10/2022 after being found down, started on broad spectrum antibiotics and pharmacy consulted to continue vancomycin.  Plan: Vancomycin 1250 mg IV q 36h (eAUC 521) Add MRSA PCR Monitor renal function, Cx/PCR to narrow Vancomycin levels as indicated  Height: 6\' 3"  (190.5 cm) Weight: 77.1 kg (170 lb) IBW/kg (Calculated) : 84.5  Temp (24hrs), Avg:99.1 F (37.3 C), Min:97.9 F (36.6 C), Max:100.7 F (38.2 C)  Recent Labs  Lab 12/10/22 1928 12/10/22 1938 12/10/22 2215 12/11/22 0017 12/11/22 0344 12/11/22 0710 12/11/22 0823  WBC 9.7  --   --   --  11.3*  --  11.7*  CREATININE 2.15* 2.10*  --   --  1.85*  --  2.04*  LATICACIDVEN  --   --  4.1* 3.5*  --  4.5*  --     Estimated Creatinine Clearance: 29.4 mL/min (A) (by C-G formula based on SCr of 2.04 mg/dL (H)).    Allergies  Allergen Reactions   Lisinopril    Prednisone     psychosis   Glucosamine Rash    Daylene Posey, PharmD, [facility] Clinical Pharmacist ED Pharmacist Phone # [phone] 12/11/2022 10:03 AM

## 2022-12-11 NOTE — Progress Notes (Signed)
PT Cancellation Note  Patient Details Name: [name] MRN: [id number] DOB: February 10, 1936   Cancelled Treatment:    Reason Eval/Treat Not Completed: Medical issues which prohibited therapy; patient with PE and awaiting therapeutic heparin level and requiring pressors in the ED.  PT will follow up when appropriate.    Elray Mcgregor 12/11/2022, 1:52 PM Sheran Lawless, PT Acute Rehabilitation Services Office:[phone] 12/11/2022

## 2022-12-11 NOTE — ED Notes (Signed)
This RN placed pt on room air due to consistent readings of 96-100% with good waveform when able to obtain reading.

## 2022-12-11 NOTE — ED Notes (Signed)
BP fluctuating since this RN spoke with Margo Aye MD.  Pt above parameters set for Levo drip.  Oncoming day shift RN to assist with clarifying order.

## 2022-12-11 NOTE — ED Notes (Signed)
This RN received call from Hull DO in regards to BP of 76/47 that just took via monitor.  This RN notified Margo Aye DO that he was about to obtain manual to verify reading from monitor due to issues obtaining pressure a minute prior via monitor.  Per provider, will go ahead and order Levo and additional interventions at this time.

## 2022-12-11 NOTE — ED Notes (Signed)
ED TO INPATIENT HANDOFF REPORT  ED Nurse Name and Phone #: Elan Mcelvain RN [phone]  S Name/Age/Gender [name] 87 y.o. male Room/Bed: 010C/010C  Code Status   Code Status: Full Code  Home/SNF/Other Skilled nursing facility Patient oriented to: place Is this baseline? Yes   Triage Complete: Triage complete  Chief Complaint AMS (altered mental status) [R41.82]  Triage Note Patient BIB [facility] from home with c/o unwitnessed fall x 2 days ago  has not been able to get up.  Patient found by neighbors on the ground.  Upon EMS arrival patients initial O2 85% RA; patient placed on non re breather then weaned down by EMS to 4L [place].  +blood thinners  18g R AC EMS administered 700 mL NS  CBG 185   Allergies Allergies  Allergen Reactions   Prednisone Other (See Comments)    Psychosis    Zestril [Lisinopril]     Unknown reaction   Glucosamine Rash    Level of Care/Admitting Diagnosis ED Disposition     ED Disposition  Admit   Condition  --   Comment  Hospital Area: [facility] [[id number]]  Level of Care: Progressive [102]  Admit to Progressive based on following criteria: MULTISYSTEM THREATS such as stable sepsis, metabolic/electrolyte imbalance with or without encephalopathy that is responding to early treatment.  May admit patient to [facility] or [facility] if equivalent level of care is available:: No  Covid Evaluation: Asymptomatic - no recent exposure (last 10 days) testing not required  Diagnosis: AMS (altered mental status) [[id number]]  Admitting Physician: Darlin Drop [[id number]]  Attending Physician: Darlin Drop [[id number]]  Certification:: I certify this patient will need inpatient services for at least 2 midnights  Estimated Length of Stay: 2          B Medical/Surgery History Past Medical History:  Diagnosis Date   CAD (coronary artery disease)    Nonobstructive   COPD (chronic obstructive pulmonary disease) (HCC)    HLD  (hyperlipidemia)    HTN (hypertension)    NICM (nonischemic cardiomyopathy) (HCC)    S/p [place] AICD placement   Permanent atrial fibrillation (HCC)    History reviewed. No pertinent surgical history.   A IV Location/Drains/Wounds Patient Lines/Drains/Airways Status     Active Line/Drains/Airways     Name Placement date Placement time Site Days   Peripheral IV 12/10/22 18 G Anterior;Distal;Right;Upper Arm 12/10/22  1910  Arm  1   Peripheral IV 12/11/22 20 G Left Wrist 12/11/22  0600  Wrist  less than 1   Peripheral IV 12/11/22 20 G Right Forearm 12/11/22  0656  Forearm  less than 1   Urethral Catheter 12/11/22  --  --  less than 1            Intake/Output Last 24 hours  Intake/Output Summary (Last 24 hours) at 12/11/2022 1704 Last data filed at 12/11/2022 1037 Gross per 24 hour  Intake 3450 ml  Output 3100 ml  Net 350 ml    Labs/Imaging Results for orders placed or performed during the hospital encounter of 12/10/22 (from the past 48 hour(s))  CBC     Status: Abnormal   Collection Time: 12/10/22  7:28 PM  Result Value Ref Range   WBC 9.7 4.0 - 10.5 K/uL   RBC 4.48 4.22 - 5.81 MIL/uL   Hemoglobin 14.1 13.0 - 17.0 g/dL   HCT 66.4 40.3 - 47.4 %   MCV 98.7 80.0 - 100.0 fL   MCH  31.5 26.0 - 34.0 pg   MCHC 31.9 30.0 - 36.0 g/dL   RDW 16.1 (H) 09.6 - 04.5 %   Platelets 179 150 - 400 K/uL   nRBC 0.0 0.0 - 0.2 %    Comment: Performed at [facility], [address], [place], [place] [address]  Troponin I (High Sensitivity)     Status: Abnormal   Collection Time: 12/10/22  7:28 PM  Result Value Ref Range   Troponin I (High Sensitivity) 103 (HH) <18 ng/L    Comment: CRITICAL RESULT CALLED TO, READ BACK BY AND VERIFIED WITH E TURNBOW RN 12/10/2022 2048 BNUNNERY (NOTE) Elevated high sensitivity troponin I (hsTnI) values and significant  changes across serial measurements may suggest ACS but many other  chronic and acute conditions are known to elevate hsTnI results.   Refer to the Links section for chest pain algorithms and additional  guidance. Performed at [facility], [address], [place], [place] [address]   Comprehensive metabolic panel     Status: Abnormal   Collection Time: 12/10/22  7:28 PM  Result Value Ref Range   Sodium 156 (H) 135 - 145 mmol/L   Potassium 4.6 3.5 - 5.1 mmol/L   Chloride 118 (H) 98 - 111 mmol/L   CO2 21 (L) 22 - 32 mmol/L   Glucose, Bld 134 (H) 70 - 99 mg/dL    Comment: Glucose reference range applies only to samples taken after fasting for at least 8 hours.   BUN 44 (H) 8 - 23 mg/dL   Creatinine, Ser 8.29 (H) 0.61 - 1.24 mg/dL   Calcium 8.8 (L) 8.9 - 10.3 mg/dL   Total Protein 6.2 (L) 6.5 - 8.1 g/dL   Albumin 3.0 (L) 3.5 - 5.0 g/dL   AST 562 (H) 15 - 41 U/L    Comment: RESULTS CONFIRMED BY MANUAL DILUTION   ALT 173 (H) 0 - 44 U/L   Alkaline Phosphatase 109 38 - 126 U/L   Total Bilirubin 3.9 (H) 0.3 - 1.2 mg/dL   GFR, Estimated 30 (L) >60 mL/min    Comment: (NOTE) Calculated using the CKD-EPI Creatinine Equation (2021)    Anion gap 17 (H) 5 - 15    Comment: Performed at [facility], [address] [place], [place] [address]  Magnesium     Status: None   Collection Time: 12/10/22  7:28 PM  Result Value Ref Range   Magnesium 2.2 1.7 - 2.4 mg/dL    Comment: Performed at [facility], [address], [place], [place] [address]  CK     Status: None   Collection Time: 12/10/22  7:28 PM  Result Value Ref Range   Total CK 333 49 - 397 U/L    Comment: Performed at [facility], [address] [place], [place] [address]  Protime-INR     Status: Abnormal   Collection Time: 12/10/22  7:28 PM  Result Value Ref Range   Prothrombin Time 19.7 (H) 11.4 - 15.2 seconds   INR 1.7 (H) 0.8 - 1.2    Comment: (NOTE) INR goal varies based on device and disease states. Performed at [facility], [address] [place], [place] [address]   APTT     Status: None   Collection Time: 12/10/22   7:28 PM  Result Value Ref Range   aPTT 31 24 - 36 seconds    Comment: Performed at [facility], [address], [place], [place] [address]  Digoxin level     Status: Abnormal   Collection Time: 12/10/22  7:28 PM  Result Value Ref Range   Digoxin Level <0.2 (L) 0.8 - 2.0 ng/mL    Comment: RESULTS CONFIRMED BY MANUAL DILUTION Performed at [facility], [address] [place], [place] [address]   Brain natriuretic peptide     Status: Abnormal   Collection Time: 12/10/22  7:28 PM  Result Value Ref Range   B Natriuretic Peptide 1,420.9 (H) 0.0 - 100.0 pg/mL    Comment: Performed at [facility], [address] [place], [place] [address]  Resp panel by RT-PCR (RSV, Flu A&B, Covid) Anterior Nasal Swab     Status: None   Collection Time: 12/10/22  7:30 PM   Specimen: Anterior Nasal Swab  Result Value Ref Range   SARS Coronavirus 2 by RT PCR NEGATIVE NEGATIVE   Influenza A by PCR NEGATIVE NEGATIVE   Influenza B by PCR NEGATIVE NEGATIVE    Comment: (NOTE) The Xpert Xpress SARS-CoV-2/FLU/RSV plus assay is intended as an aid in the diagnosis of influenza from Nasopharyngeal swab specimens and should not be used as a sole basis for treatment. Nasal washings and aspirates are unacceptable for Xpert Xpress SARS-CoV-2/FLU/RSV testing.  Fact Sheet for Patients: BloggerCourse.com  Fact Sheet for Healthcare Providers: SeriousBroker.it  This test is not yet approved or cleared by the [place] FDA and has been authorized for detection and/or diagnosis of SARS-CoV-2 by FDA under an Emergency Use Authorization (EUA). This EUA will remain in effect (meaning this test can be used) for the duration of the COVID-19 declaration under Section 564(b)(1) of the Act, 21 U.S.C. section 360bbb-3(b)(1), unless the authorization is terminated or revoked.     Resp Syncytial Virus by PCR NEGATIVE NEGATIVE    Comment: (NOTE) Fact  Sheet for Patients: BloggerCourse.com  Fact Sheet for Healthcare Providers: SeriousBroker.it  This test is not yet approved or cleared by the [place] FDA and has been authorized for detection and/or diagnosis of SARS-CoV-2 by FDA under an Emergency Use Authorization (EUA). This EUA will remain in effect (meaning this test can be used) for the duration of the COVID-19 declaration under Section 564(b)(1) of the Act, 21 U.S.C. section 360bbb-3(b)(1), unless the authorization is terminated or revoked.  Performed at [facility], [address] [place], [place] [address]   I-Stat venous blood gas, [facility] ED, [facility], [facility])     Status: Abnormal   Collection Time: 12/10/22  7:38 PM  Result Value Ref Range   pH, Ven 7.405 7.25 - 7.43   pCO2, Ven 44.8 44 - 60 mmHg   pO2, Ven 39 32 - 45 mmHg   Bicarbonate 28.1 (H) 20.0 - 28.0 mmol/L   TCO2 29 22 - 32 mmol/L   O2 Saturation 73 %   Acid-Base Excess 3.0 (H) 0.0 - 2.0 mmol/L   Sodium 155 (H) 135 - 145 mmol/L   Potassium 5.0 3.5 - 5.1 mmol/L   Calcium, Ion 1.11 (L) 1.15 - 1.40 mmol/L   HCT 42.0 39.0 - 52.0 %   Hemoglobin 14.3 13.0 - 17.0 g/dL   Sample type VENOUS    Comment NOTIFIED PHYSICIAN   I-stat chem 8, ED (not at [facility], [facility] or [facility])     Status: Abnormal   Collection Time: 12/10/22  7:38 PM  Result Value Ref Range   Sodium 155 (H) 135 - 145 mmol/L   Potassium 5.0 3.5 -  5.1 mmol/L   Chloride 119 (H) 98 - 111 mmol/L   BUN 57 (H) 8 - 23 mg/dL   Creatinine, Ser 4.01 (H) 0.61 - 1.24 mg/dL   Glucose, Bld 027 (H) 70 - 99 mg/dL    Comment: Glucose reference range applies only to samples taken after fasting for at least 8 hours.   Calcium, Ion 1.10 (L) 1.15 - 1.40 mmol/L   TCO2 24 22 - 32 mmol/L   Hemoglobin 14.3 13.0 - 17.0 g/dL   HCT 25.3 66.4 - 40.3 %  Urinalysis, Routine w reflex microscopic -Urine, Clean Catch     Status: Abnormal   Collection Time: 12/10/22 10:09 PM  Result  Value Ref Range   Color, Urine AMBER (A) YELLOW    Comment: BIOCHEMICALS MAY BE AFFECTED BY COLOR   APPearance CLEAR CLEAR   Specific Gravity, Urine 1.016 1.005 - 1.030   pH 5.0 5.0 - 8.0   Glucose, UA NEGATIVE NEGATIVE mg/dL   Hgb urine dipstick MODERATE (A) NEGATIVE   Bilirubin Urine NEGATIVE NEGATIVE   Ketones, ur NEGATIVE NEGATIVE mg/dL   Protein, ur NEGATIVE NEGATIVE mg/dL   Nitrite NEGATIVE NEGATIVE   Leukocytes,Ua NEGATIVE NEGATIVE   RBC / HPF 11-20 0 - 5 RBC/hpf   WBC, UA 0-5 0 - 5 WBC/hpf   Bacteria, UA RARE (A) NONE SEEN   Squamous Epithelial / HPF 0-5 0 - 5 /HPF   Mucus PRESENT     Comment: Performed at [facility], [address] [place], [place] [address]  Lactic acid, plasma     Status: Abnormal   Collection Time: 12/10/22 10:15 PM  Result Value Ref Range   Lactic Acid, Venous 4.1 (HH) 0.5 - 1.9 mmol/L    Comment: CALL ATTEMPTED 2316 12/10/22 CALL ATTEMPTED 2325 12/10/22 CRITICAL RESULT CALLED TO, READ BACK BY AND VERIFIED WITH Darcella Cheshire, RN. [phone] 12/10/22. LPAIT Performed at [facility], [address], [place], [place] [address]   Troponin I (High Sensitivity)     Status: Abnormal   Collection Time: 12/10/22 10:15 PM  Result Value Ref Range   Troponin I (High Sensitivity) 85 (H) <18 ng/L    Comment: (NOTE) Elevated high sensitivity troponin I (hsTnI) values and significant  changes across serial measurements may suggest ACS but many other  chronic and acute conditions are known to elevate hsTnI results.  Refer to the "Links" section for chest pain algorithms and additional  guidance. Performed at [facility], [address] [place], [place] [address]   Heparin level (unfractionated)     Status: Abnormal   Collection Time: 12/10/22 11:31 PM  Result Value Ref Range   Heparin Unfractionated <0.10 (L) 0.30 - 0.70 IU/mL    Comment: (NOTE) The clinical reportable range upper limit is being lowered to >1.10 to align with the FDA approved  guidance for the current laboratory assay.  If heparin results are below expected values, and patient dosage has  been confirmed, suggest follow up testing of antithrombin III levels. Performed at [facility], [address], [place], [place] [address]   APTT     Status: None   Collection Time: 12/10/22 11:31 PM  Result Value Ref Range   aPTT 33 24 - 36 seconds    Comment: Performed at [facility], [address], [place], [place] [address]  Lactic acid, plasma     Status: Abnormal   Collection Time: 12/11/22 12:17 AM  Result Value Ref  Range   Lactic Acid, Venous 3.5 (HH) 0.5 - 1.9 mmol/L    Comment: CRITICAL VALUE NOTED. VALUE IS CONSISTENT WITH PREVIOUSLY REPORTED/CALLED VALUE Performed at [facility], [address], [place], [place] [address]   Comprehensive metabolic panel     Status: Abnormal   Collection Time: 12/11/22  3:44 AM  Result Value Ref Range   Sodium 150 (H) 135 - 145 mmol/L   Potassium 4.1 3.5 - 5.1 mmol/L   Chloride 114 (H) 98 - 111 mmol/L   CO2 22 22 - 32 mmol/L   Glucose, Bld 167 (H) 70 - 99 mg/dL    Comment: Glucose reference range applies only to samples taken after fasting for at least 8 hours.   BUN 44 (H) 8 - 23 mg/dL   Creatinine, Ser 2.95 (H) 0.61 - 1.24 mg/dL   Calcium 8.6 (L) 8.9 - 10.3 mg/dL   Total Protein 5.9 (L) 6.5 - 8.1 g/dL   Albumin 2.8 (L) 3.5 - 5.0 g/dL   AST 621 (H) 15 - 41 U/L   ALT 196 (H) 0 - 44 U/L   Alkaline Phosphatase 120 38 - 126 U/L   Total Bilirubin 3.3 (H) 0.3 - 1.2 mg/dL   GFR, Estimated 35 (L) >60 mL/min    Comment: (NOTE) Calculated using the CKD-EPI Creatinine Equation (2021)    Anion gap 14 5 - 15    Comment: Performed at [facility], [address], [place], [place] [address]  CBC     Status: Abnormal   Collection Time: 12/11/22  3:44 AM  Result Value Ref Range   WBC 11.3 (H) 4.0 - 10.5 K/uL   RBC 4.53 4.22 - 5.81 MIL/uL   Hemoglobin 14.4 13.0 - 17.0 g/dL   HCT 78.4 69.6 - 29.5 %    MCV 102.6 (H) 80.0 - 100.0 fL   MCH 31.8 26.0 - 34.0 pg   MCHC 31.0 30.0 - 36.0 g/dL   RDW 28.4 (H) 13.2 - 44.0 %   Platelets 145 (L) 150 - 400 K/uL   nRBC 0.0 0.0 - 0.2 %    Comment: Performed at [facility] Lab, [address] [place], [place] [address]  Hemoglobin A1c     Status: None   Collection Time: 12/11/22  3:44 AM  Result Value Ref Range   Hgb A1c MFr Bld 5.6 4.8 - 5.6 %    Comment: (NOTE) Pre diabetes:          5.7%-6.4%  Diabetes:              >6.4%  Glycemic control for   <7.0% adults with diabetes    Mean Plasma Glucose 114.02 mg/dL    Comment: Performed at [facility], [address], [place], [place] [address]  Culture, blood (Routine X 2) w Reflex to ID Panel     Status: None (Preliminary result)   Collection Time: 12/11/22  6:25 AM   Specimen: BLOOD  Result Value Ref Range   Specimen Description BLOOD BLOOD LEFT FOREARM    Special Requests      BOTTLES DRAWN AEROBIC AND ANAEROBIC Blood Culture results may not be optimal due to an inadequate volume of blood received in culture bottles   Culture      NO GROWTH < 12 HOURS Performed at [facility], [address], [place], [place] [address]    Report Status PENDING   Culture, blood (Routine X 2) w Reflex to ID  Panel     Status: None (Preliminary result)   Collection Time: 12/11/22  6:30 AM   Specimen: BLOOD LEFT HAND  Result Value Ref Range   Specimen Description BLOOD LEFT HAND    Special Requests      BOTTLES DRAWN AEROBIC AND ANAEROBIC Blood Culture adequate volume   Culture      NO GROWTH <12 HOURS Performed at [facility], [address] [place], [place] [address]    Report Status PENDING   Lactic acid, plasma     Status: Abnormal   Collection Time: 12/11/22  7:10 AM  Result Value Ref Range   Lactic Acid, Venous 4.5 (HH) 0.5 - 1.9 mmol/L    Comment: CRITICAL VALUE NOTED. VALUE IS CONSISTENT WITH PREVIOUSLY REPORTED/CALLED VALUE Performed at [facility], [address], [place], [place] [address]   CBG monitoring, ED     Status: Abnormal   Collection Time: 12/11/22  7:56 AM  Result Value Ref Range   Glucose-Capillary 155 (H) 70 - 99 mg/dL    Comment: Glucose reference range applies only to samples taken after fasting for at least 8 hours.  Heparin level (unfractionated)     Status: None   Collection Time: 12/11/22  8:23 AM  Result Value Ref Range   Heparin Unfractionated 0.45 0.30 - 0.70 IU/mL    Comment: (NOTE) The clinical reportable range upper limit is being lowered to >1.10 to align with the FDA approved guidance for the current laboratory assay.  If heparin results are below expected values, and patient dosage has  been confirmed, suggest follow up testing of antithrombin III levels. Performed at [facility], [address] [place], [place] [address]   CBC     Status: Abnormal   Collection Time: 12/11/22  8:23 AM  Result Value Ref Range   WBC 11.7 (H) 4.0 - 10.5 K/uL   RBC 4.68 4.22 - 5.81 MIL/uL   Hemoglobin 14.5 13.0 - 17.0 g/dL   HCT 46.9 62.9 - 52.8 %   MCV 102.1 (H) 80.0 - 100.0 fL   MCH 31.0 26.0 - 34.0 pg   MCHC 30.3 30.0 - 36.0 g/dL   RDW 41.3 (H) 24.4 - 01.0 %   Platelets 155 150 - 400 K/uL   nRBC 0.0 0.0 - 0.2 %    Comment: Performed at [facility], [address] [place], [place] [address]  Comprehensive metabolic panel     Status: Abnormal   Collection Time: 12/11/22  8:23 AM  Result Value Ref Range   Sodium 152 (H) 135 - 145 mmol/L   Potassium 4.3 3.5 - 5.1 mmol/L   Chloride 118 (H) 98 - 111 mmol/L   CO2 21 (L) 22 - 32 mmol/L   Glucose, Bld 165 (H) 70 - 99 mg/dL    Comment: Glucose reference range applies only to samples taken after fasting for at least 8 hours.   BUN 44 (H) 8 - 23 mg/dL   Creatinine, Ser 6.64 (H) 0.61 - 1.24 mg/dL   Calcium 8.6 (L) 8.9 - 10.3 mg/dL   Total Protein 6.1 (L) 6.5 - 8.1 g/dL   Albumin 2.9 (L) 3.5 - 5.0 g/dL   AST 403 (H) 15 - 41 U/L   ALT 231 (H) 0 - 44 U/L    Alkaline Phosphatase 121 38 - 126 U/L   Total Bilirubin 3.1 (H) 0.3 - 1.2 mg/dL   GFR, Estimated 32 (L) >60 mL/min  Comment: (NOTE) Calculated using the CKD-EPI Creatinine Equation (2021)    Anion gap 13 5 - 15    Comment: Performed at [facility] Lab, [address] [place], [place] [address]  I-Stat venous blood gas, ED     Status: Abnormal   Collection Time: 12/11/22  8:30 AM  Result Value Ref Range   pH, Ven 7.289 7.25 - 7.43   pCO2, Ven 45.7 44 - 60 mmHg   pO2, Ven 30 (LL) 32 - 45 mmHg   Bicarbonate 21.9 20.0 - 28.0 mmol/L   TCO2 23 22 - 32 mmol/L   O2 Saturation 50 %   Acid-base deficit 5.0 (H) 0.0 - 2.0 mmol/L   Sodium 152 (H) 135 - 145 mmol/L   Potassium 4.2 3.5 - 5.1 mmol/L   Calcium, Ion 1.00 (L) 1.15 - 1.40 mmol/L   HCT 45.0 39.0 - 52.0 %   Hemoglobin 15.3 13.0 - 17.0 g/dL   Sample type VENOUS    Comment NOTIFIED PHYSICIAN   Urinalysis, w/ Reflex to Culture (Infection Suspected) -Urine, Clean Catch     Status: Abnormal   Collection Time: 12/11/22  8:33 AM  Result Value Ref Range   Specimen Source URINE, CLEAN CATCH    Color, Urine RED (A) YELLOW    Comment: BIOCHEMICALS MAY BE AFFECTED BY COLOR   APPearance TURBID (A) CLEAR   Specific Gravity, Urine  1.005 - 1.030    TEST NOT REPORTED DUE TO COLOR INTERFERENCE OF URINE PIGMENT   pH  5.0 - 8.0    TEST NOT REPORTED DUE TO COLOR INTERFERENCE OF URINE PIGMENT   Glucose, UA (A) NEGATIVE mg/dL    TEST NOT REPORTED DUE TO COLOR INTERFERENCE OF URINE PIGMENT   Hgb urine dipstick (A) NEGATIVE    TEST NOT REPORTED DUE TO COLOR INTERFERENCE OF URINE PIGMENT   Bilirubin Urine (A) NEGATIVE    TEST NOT REPORTED DUE TO COLOR INTERFERENCE OF URINE PIGMENT   Ketones, ur (A) NEGATIVE mg/dL    TEST NOT REPORTED DUE TO COLOR INTERFERENCE OF URINE PIGMENT   Protein, ur (A) NEGATIVE mg/dL    TEST NOT REPORTED DUE TO COLOR INTERFERENCE OF URINE PIGMENT   Nitrite (A) NEGATIVE    TEST NOT REPORTED DUE TO COLOR INTERFERENCE OF  URINE PIGMENT   Leukocytes,Ua (A) NEGATIVE    TEST NOT REPORTED DUE TO COLOR INTERFERENCE OF URINE PIGMENT   Squamous Epithelial / HPF NONE SEEN 0 - 5 /HPF   WBC, UA 0-5 0 - 5 WBC/hpf    Comment: Reflex urine culture not performed if WBC <=10, OR if Squamous epithelial cells >5. If Squamous epithelial cells >5, suggest recollection.   RBC / HPF >50 0 - 5 RBC/hpf   Bacteria, UA MANY (A) NONE SEEN    Comment: Performed at [facility] Lab, [address], [place], [place] [address]  MRSA Next Gen by PCR, Nasal     Status: None   Collection Time: 12/11/22  8:40 AM   Specimen: Nasal Mucosa; Nasal Swab  Result Value Ref Range   MRSA by PCR Next Gen NOT DETECTED NOT DETECTED    Comment: (NOTE) The GeneXpert MRSA Assay (FDA approved for NASAL specimens only), is one component of a comprehensive MRSA colonization surveillance program. It is not intended to diagnose MRSA infection nor to guide or monitor treatment for MRSA infections. Test performance is not FDA approved in patients less than 44 years old. Performed at [facility], [address] [place], [place] [address]   CBG monitoring, ED     Status: Abnormal   Collection Time: 12/11/22 12:00 PM  Result Value Ref Range   Glucose-Capillary 159 (H) 70 - 99 mg/dL    Comment: Glucose reference range applies only to samples taken after fasting for at least 8 hours.  Lactic acid, plasma     Status: Abnormal   Collection Time: 12/11/22 12:38 PM  Result Value Ref Range   Lactic Acid, Venous 2.6 (HH) 0.5 - 1.9 mmol/L    Comment: CRITICAL VALUE NOTED. VALUE IS CONSISTENT WITH PREVIOUSLY REPORTED/CALLED VALUE Performed at [facility], [address], [place], [place] [address]   CBG monitoring, ED     Status: Abnormal   Collection Time: 12/11/22  3:35 PM  Result Value Ref Range   Glucose-Capillary 132 (H) 70 - 99 mg/dL    Comment: Glucose reference range applies only to samples taken after fasting for at least 8 hours.    ECHOCARDIOGRAM COMPLETE  Result Date: 12/11/2022    ECHOCARDIOGRAM REPORT   Patient Name:   [name] Date of Exam: 12/11/2022 Medical Rec #:  [id number]        Height:       75.0 in Accession #:    [id number]       Weight:       170.0 lb Date of Birth:  [birth year]        BSA:          2.048 m Patient Age:    86 years         BP:           101/83 mmHg Patient Gender: M                HR:           104 bpm. Exam Location:  Inpatient Procedure: 2D Echo, Color Doppler and Cardiac Doppler Indications:    Elevated troponin  History:        Patient has prior history of Echocardiogram examinations, most                 recent 03/04/2022. Cardiomyopathy and HFrEF, CAD, Defibrillator,                 COPD, Arrythmias:Atrial Fibrillation, Signs/Symptoms:Altered                 Mental Status; Risk Factors:Hypertension and Dyslipidemia.  Sonographer:    Milda Smart Referring Phys: [id number] CAROLE N HALL  Sonographer Comments: Image acquisition challenging due to patient body habitus, Image acquisition challenging due to uncooperative patient and Image acquisition challenging due to respiratory motion. Pt could not be optimally positioned. IMPRESSIONS  1. Left ventricular ejection fraction, by estimation, is 50 to 55%. The left ventricle has low normal function. Left ventricular endocardial border not optimally defined to evaluate regional wall motion. Left ventricular diastolic function could not be evaluated.  2. Right ventricular systolic function is moderately reduced. The right ventricular size is normal.  3. Right atrial size was severely dilated.  4. The mitral valve is normal in structure. Trivial mitral valve regurgitation. No evidence of mitral stenosis.  5. The aortic valve is normal in structure. Aortic valve regurgitation is not visualized. No aortic stenosis is present.  6. The inferior vena cava is dilated in size with <50% respiratory variability, suggesting right atrial pressure of 15 mmHg.  Conclusion(s)/Recommendation(s): Technically very limited study with poor sound wave transmission. LV  never seen well but EF appears preserved on apica images. RV is moderately hypokientic. There is a pcing wire evident in the coronary sinus. FINDINGS  Left Ventricle: Left ventricular ejection fraction, by estimation, is 50 to 55%. The left ventricle has low normal function. Left ventricular endocardial border not optimally defined to evaluate regional wall motion. The left ventricular internal cavity  size was normal in size. There is no left ventricular hypertrophy. Left ventricular diastolic function could not be evaluated due to atrial fibrillation. Left ventricular diastolic function could not be evaluated. Right Ventricle: The right ventricular size is normal. No increase in right ventricular wall thickness. Right ventricular systolic function is moderately reduced. Left Atrium: Left atrial size was normal in size. Right Atrium: Right atrial size was severely dilated. Pericardium: There is no evidence of pericardial effusion. Mitral Valve: The mitral valve is normal in structure. Mild mitral annular calcification. Trivial mitral valve regurgitation. No evidence of mitral valve stenosis. Tricuspid Valve: The tricuspid valve is not well visualized. Tricuspid valve regurgitation is mild . No evidence of tricuspid stenosis. Aortic Valve: The aortic valve is normal in structure. Aortic valve regurgitation is not visualized. No aortic stenosis is present. Pulmonic Valve: The pulmonic valve was normal in structure. Pulmonic valve regurgitation is not visualized. No evidence of pulmonic stenosis. Aorta: The aortic root is normal in size and structure. Venous: The inferior vena cava is dilated in size with less than 50% respiratory variability, suggesting right atrial pressure of 15 mmHg. IAS/Shunts: No atrial level shunt detected by color flow Doppler. Additional Comments: A device lead is visualized.  LEFT ATRIUM            Index        RIGHT ATRIUM           Index LA Vol (A4C): 52.5 ml 25.64 ml/m  RA Area:     39.40 cm                                    RA Volume:   163.00 ml 79.59 ml/m Arvilla Meres MD Electronically signed by Arvilla Meres MD Signature Date/Time: 12/11/2022/1:49:21 PM    Final    DG CHEST PORT 1 VIEW  Result Date: 12/11/2022 CLINICAL DATA:  Unwitnessed fall. EXAM: PORTABLE CHEST 1 VIEW COMPARISON:  12/10/2022 FINDINGS: The lungs are clear without focal pneumonia, edema, pneumothorax or pleural effusion. Cardiopericardial silhouette is at upper limits of normal for size. Left pacer/AICD again noted. No acute bony abnormality. Telemetry leads overlie the chest. IMPRESSION: No active disease. Electronically Signed   By: Kennith Center M.D.   On: 12/11/2022 07:35   CT Angio Chest PE W and/or Wo Contrast  Result Date: 12/10/2022 CLINICAL DATA:  Pulmonary embolus suspected with high probability. Fall. Sepsis. EXAM: CT ANGIOGRAPHY CHEST CT ABDOMEN AND PELVIS WITH CONTRAST TECHNIQUE: Multidetector CT imaging of the chest was performed using the standard protocol during bolus administration of intravenous contrast. Multiplanar CT image reconstructions and MIPs were obtained to evaluate the vascular anatomy. Multidetector CT imaging of the abdomen and pelvis was performed using the standard protocol during bolus administration of intravenous contrast. RADIATION DOSE REDUCTION: This exam was performed according to the departmental dose-optimization program which includes automated exposure control, adjustment of the mA and/or kV according to patient size and/or use of iterative reconstruction technique. CONTRAST:  75mL OMNIPAQUE IOHEXOL 350 MG/ML SOLN COMPARISON:  CT chest 07/25/2009 FINDINGS: CTA CHEST  FINDINGS Cardiovascular: Good visualization of the central and segmental pulmonary arteries. Filling defects are demonstrated in bilateral lower lobe subsegmental pulmonary arteries suggesting peripheral  pulmonary embolus. No large central pulmonary emboli. Clot burden is very low. No contrast material is demonstrated in the left ventricle limiting evaluation of the RV to LV ratio. The cardiac chambers appear grossly symmetrical. Due to low clot burden, right heart strain is unlikely. There is diffuse cardiac enlargement with reflux of contrast material into the hepatic veins suggesting right heart failure. Normal caliber thoracic aorta. Calcification of the aorta and coronary arteries. Mediastinum/Nodes: No enlarged mediastinal, hilar, or axillary lymph nodes. Thyroid gland, trachea, and esophagus demonstrate no significant findings. Venous gas is present likely resulting from intravenous injections. Cardiac pacemaker. Lungs/Pleura: Diffuse emphysematous changes in the lungs. Bronchial wall thickening consistent with chronic bronchitis. Patchy airspace infiltrates in the right upper lung likely representing pneumonia. Aspiration could also have this appearance. No pleural effusions. No pneumothorax. Musculoskeletal: Degenerative changes in the spine and shoulders. Review of the MIP images confirms the above findings. CT ABDOMEN and PELVIS FINDINGS Hepatobiliary: No focal liver abnormality is seen. Status post cholecystectomy. No biliary dilatation. Pancreas: Unremarkable. No pancreatic ductal dilatation or surrounding inflammatory changes. Spleen: Normal in size without focal abnormality. Adrenals/Urinary Tract: No adrenal gland nodules. Nephrograms are diffusely heterogeneous and striated bilaterally, likely indicating pyelonephritis. No hydronephrosis or hydroureter. Bladder is decompressed with a Foley catheter in place. Stomach/Bowel: Stomach is within normal limits. Appendix is not identified. No evidence of bowel wall thickening, distention, or inflammatory changes. Vascular/Lymphatic: Diffuse aortic atherosclerosis with diffuse calcification. No aneurysm or dissection. Reproductive: Prostate gland is mildly  enlarged. Other: No free air or free fluid in the abdomen. Abdominal wall musculature appears intact. Musculoskeletal: Degenerative changes in the spine. Review of the MIP images confirms the above findings. IMPRESSION: 1. Positive examination for peripheral lower lobe pulmonary emboli. No significant central pulmonary embolus. Low clot burden indicates right heart strain would be unlikely. 2. Airspace disease in the right upper lung is likely pneumonia. 3. Diffuse emphysematous changes in the lungs. Chronic bronchitic changes. 4. Aortic atherosclerosis. 5. Heterogeneous striated nephrograms bilaterally likely indicate pyelonephritis. 6. Prostate gland is enlarged. Critical Value/emergent results were called by telephone at the time of interpretation on 12/10/2022 at 9:43 pm to provider Vonita Moss , who verbally acknowledged these results. Electronically Signed   By: Burman Nieves M.D.   On: 12/10/2022 21:52   CT ABDOMEN PELVIS W CONTRAST  Result Date: 12/10/2022 CLINICAL DATA:  Pulmonary embolus suspected with high probability. Fall. Sepsis. EXAM: CT ANGIOGRAPHY CHEST CT ABDOMEN AND PELVIS WITH CONTRAST TECHNIQUE: Multidetector CT imaging of the chest was performed using the standard protocol during bolus administration of intravenous contrast. Multiplanar CT image reconstructions and MIPs were obtained to evaluate the vascular anatomy. Multidetector CT imaging of the abdomen and pelvis was performed using the standard protocol during bolus administration of intravenous contrast. RADIATION DOSE REDUCTION: This exam was performed according to the departmental dose-optimization program which includes automated exposure control, adjustment of the mA and/or kV according to patient size and/or use of iterative reconstruction technique. CONTRAST:  75mL OMNIPAQUE IOHEXOL 350 MG/ML SOLN COMPARISON:  CT chest 07/25/2009 FINDINGS: CTA CHEST FINDINGS Cardiovascular: Good visualization of the central and segmental  pulmonary arteries. Filling defects are demonstrated in bilateral lower lobe subsegmental pulmonary arteries suggesting peripheral pulmonary embolus. No large central pulmonary emboli. Clot burden is very low. No contrast material is demonstrated in the left ventricle limiting evaluation of the  RV to LV ratio. The cardiac chambers appear grossly symmetrical. Due to low clot burden, right heart strain is unlikely. There is diffuse cardiac enlargement with reflux of contrast material into the hepatic veins suggesting right heart failure. Normal caliber thoracic aorta. Calcification of the aorta and coronary arteries. Mediastinum/Nodes: No enlarged mediastinal, hilar, or axillary lymph nodes. Thyroid gland, trachea, and esophagus demonstrate no significant findings. Venous gas is present likely resulting from intravenous injections. Cardiac pacemaker. Lungs/Pleura: Diffuse emphysematous changes in the lungs. Bronchial wall thickening consistent with chronic bronchitis. Patchy airspace infiltrates in the right upper lung likely representing pneumonia. Aspiration could also have this appearance. No pleural effusions. No pneumothorax. Musculoskeletal: Degenerative changes in the spine and shoulders. Review of the MIP images confirms the above findings. CT ABDOMEN and PELVIS FINDINGS Hepatobiliary: No focal liver abnormality is seen. Status post cholecystectomy. No biliary dilatation. Pancreas: Unremarkable. No pancreatic ductal dilatation or surrounding inflammatory changes. Spleen: Normal in size without focal abnormality. Adrenals/Urinary Tract: No adrenal gland nodules. Nephrograms are diffusely heterogeneous and striated bilaterally, likely indicating pyelonephritis. No hydronephrosis or hydroureter. Bladder is decompressed with a Foley catheter in place. Stomach/Bowel: Stomach is within normal limits. Appendix is not identified. No evidence of bowel wall thickening, distention, or inflammatory changes.  Vascular/Lymphatic: Diffuse aortic atherosclerosis with diffuse calcification. No aneurysm or dissection. Reproductive: Prostate gland is mildly enlarged. Other: No free air or free fluid in the abdomen. Abdominal wall musculature appears intact. Musculoskeletal: Degenerative changes in the spine. Review of the MIP images confirms the above findings. IMPRESSION: 1. Positive examination for peripheral lower lobe pulmonary emboli. No significant central pulmonary embolus. Low clot burden indicates right heart strain would be unlikely. 2. Airspace disease in the right upper lung is likely pneumonia. 3. Diffuse emphysematous changes in the lungs. Chronic bronchitic changes. 4. Aortic atherosclerosis. 5. Heterogeneous striated nephrograms bilaterally likely indicate pyelonephritis. 6. Prostate gland is enlarged. Critical Value/emergent results were called by telephone at the time of interpretation on 12/10/2022 at 9:43 pm to provider Vonita Moss , who verbally acknowledged these results. Electronically Signed   By: Burman Nieves M.D.   On: 12/10/2022 21:52   CT Head Wo Contrast  Result Date: 12/10/2022 CLINICAL DATA:  Fall EXAM: CT HEAD WITHOUT CONTRAST CT CERVICAL SPINE WITHOUT CONTRAST TECHNIQUE: Multidetector CT imaging of the head and cervical spine was performed following the standard protocol without intravenous contrast. Multiplanar CT image reconstructions of the cervical spine were also generated. RADIATION DOSE REDUCTION: This exam was performed according to the departmental dose-optimization program which includes automated exposure control, adjustment of the mA and/or kV according to patient size and/or use of iterative reconstruction technique. COMPARISON:  None Available. FINDINGS: CT HEAD FINDINGS Brain: There is no mass, hemorrhage or extra-axial collection. There is generalized atrophy without lobar predilection. There is hypoattenuation of the periventricular white matter, most commonly  indicating chronic ischemic microangiopathy. Old left occipital infarct. Vascular: Atherosclerotic calcification of the vertebral and internal carotid arteries at the skull base. No abnormal hyperdensity of the major intracranial arteries or dural venous sinuses. Skull: The visualized skull base, calvarium and extracranial soft tissues are normal. Sinuses/Orbits: No fluid levels or advanced mucosal thickening of the visualized paranasal sinuses. No mastoid or middle ear effusion. The orbits are normal. CT CERVICAL SPINE FINDINGS Alignment: Reversal of normal cervical lordosis. Grade 1 anterolisthesis at C3-4 and grade 1 retrolisthesis at C4-5 and C5-6. Skull base and vertebrae: No acute fracture. Soft tissues and spinal canal: No prevertebral fluid or swelling. No visible  canal hematoma. Disc levels: No advanced spinal canal or neural foraminal stenosis. Upper chest: Severe facet arthrosis at C2-3 and C3-4. No high-grade spinal canal stenosis. Other: Normal visualized paraspinal cervical soft tissues. IMPRESSION: 1. No acute intracranial abnormality. 2. 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 3. No acute fracture or static subluxation of the cervical spine. Electronically Signed   By: Deatra Robinson M.D.   On: 12/10/2022 21:33   CT Cervical Spine Wo Contrast  Result Date: 12/10/2022 CLINICAL DATA:  Fall EXAM: CT HEAD WITHOUT CONTRAST CT CERVICAL SPINE WITHOUT CONTRAST TECHNIQUE: Multidetector CT imaging of the head and cervical spine was performed following the standard protocol without intravenous contrast. Multiplanar CT image reconstructions of the cervical spine were also generated. RADIATION DOSE REDUCTION: This exam was performed according to the departmental dose-optimization program which includes automated exposure control, adjustment of the mA and/or kV according to patient size and/or use of iterative reconstruction technique. COMPARISON:  None Available. FINDINGS: CT HEAD  FINDINGS Brain: There is no mass, hemorrhage or extra-axial collection. There is generalized atrophy without lobar predilection. There is hypoattenuation of the periventricular white matter, most commonly indicating chronic ischemic microangiopathy. Old left occipital infarct. Vascular: Atherosclerotic calcification of the vertebral and internal carotid arteries at the skull base. No abnormal hyperdensity of the major intracranial arteries or dural venous sinuses. Skull: The visualized skull base, calvarium and extracranial soft tissues are normal. Sinuses/Orbits: No fluid levels or advanced mucosal thickening of the visualized paranasal sinuses. No mastoid or middle ear effusion. The orbits are normal. CT CERVICAL SPINE FINDINGS Alignment: Reversal of normal cervical lordosis. Grade 1 anterolisthesis at C3-4 and grade 1 retrolisthesis at C4-5 and C5-6. Skull base and vertebrae: No acute fracture. Soft tissues and spinal canal: No prevertebral fluid or swelling. No visible canal hematoma. Disc levels: No advanced spinal canal or neural foraminal stenosis. Upper chest: Severe facet arthrosis at C2-3 and C3-4. No high-grade spinal canal stenosis. Other: Normal visualized paraspinal cervical soft tissues. IMPRESSION: 1. No acute intracranial abnormality. 2. 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 3. No acute fracture or static subluxation of the cervical spine. Electronically Signed   By: Deatra Robinson M.D.   On: 12/10/2022 21:33   DG Chest Portable 1 View  Result Date: 12/10/2022 CLINICAL DATA:  Altered mental status EXAM: PORTABLE CHEST 1 VIEW COMPARISON:  05/03/2010 FINDINGS: Interim placement of left-sided pacing device. Borderline cardiac size. Aortic atherosclerosis. No acute airspace disease, pleural effusion or pneumothorax. IMPRESSION: No active disease. Electronically Signed   By: Jasmine Pang M.D.   On: 12/10/2022 20:44    Pending Labs Unresulted Labs (From admission, onward)      Start     Ordered   12/12/22 0500  CBC  Daily,   R      12/10/22 2326   12/12/22 0500  Comprehensive metabolic panel  Tomorrow morning,   R        12/11/22 1437   12/11/22 1700  Sodium  Once,   R        12/11/22 1323   12/11/22 1700  Hemoglobin and hematocrit, blood  Once,   R        12/11/22 1421   12/11/22 0632  Blood gas, venous  ONCE - STAT,   STAT        12/11/22 0632   12/10/22 1920  Ammonia  Once,   STAT        12/10/22 1919  Vitals/Pain Today's Vitals   12/11/22 1330 12/11/22 1400 12/11/22 1531 12/11/22 1630  BP: 117/85 110/88 118/80 103/77  Pulse:    (!) 121  Resp: 17 13  16   Temp:      TempSrc:      SpO2:    95%  Weight:      Height:      PainSc:        Isolation Precautions No active isolations  Medications Medications  amiodarone (NEXTERONE) 1.8 mg/mL load via infusion 150 mg (150 mg Intravenous Bolus from Bag 12/10/22 2130)    Followed by  amiodarone (NEXTERONE PREMIX) 360-4.14 MG/200ML-% (1.8 mg/mL) IV infusion (0 mg/hr Intravenous Stopped 12/11/22 0527)    Followed by  amiodarone (NEXTERONE PREMIX) 360-4.14 MG/200ML-% (1.8 mg/mL) IV infusion (30 mg/hr Intravenous Rate/Dose Verify 12/11/22 1347)  digoxin (LANOXIN) 0.25 MG/ML injection 0.125 mg (0.125 mg Intravenous Given 12/11/22 1350)  trimethoprim-polymyxin b (POLYTRIM) ophthalmic solution 2 drop (2 drops Both Eyes Given 12/11/22 1245)  metoprolol tartrate (LOPRESSOR) injection 5 mg (5 mg Intravenous Not Given 12/11/22 1242)  metroNIDAZOLE (FLAGYL) IVPB 500 mg (0 mg Intravenous Stopped 12/11/22 1037)  prochlorperazine (COMPAZINE) injection 5 mg (has no administration in time range)  vancomycin (VANCOREADY) IVPB 1250 mg/250 mL (has no administration in time range)  ceFEPIme (MAXIPIME) 2 g in sodium chloride 0.9 % 100 mL IVPB (has no administration in time range)  dextrose 5 % and 0.45 % NaCl infusion ( Intravenous New Bag/Given 12/11/22 1601)  lactated ringers bolus 1,000 mL (0 mLs  Intravenous Stopped 12/10/22 2207)  ceFEPIme (MAXIPIME) 2 g in sodium chloride 0.9 % 100 mL IVPB (0 g Intravenous Stopped 12/10/22 2040)  metroNIDAZOLE (FLAGYL) IVPB 500 mg (0 mg Intravenous Stopped 12/10/22 2132)  vancomycin (VANCOREADY) IVPB 1750 mg/350 mL (0 mg Intravenous Stopped 12/10/22 2333)  iohexol (OMNIPAQUE) 350 MG/ML injection 75 mL (75 mLs Intravenous Contrast Given 12/10/22 2104)  lactated ringers bolus 1,000 mL (0 mLs Intravenous Stopped 12/11/22 0206)  sodium chloride 0.9 % bolus 250 mL (0 mLs Intravenous Stopped 12/11/22 0735)    Mobility non-ambulatory     Focused Assessments PT is confused, thinks his daughter is here with him.  Tells you he loves you a lot and misses you.  Pleasant and easily redirectable but needs the soft restraints to keep from pulling IVs   R Recommendations: See Admitting Provider Note  Report given to:   Additional Notes: as above

## 2022-12-11 NOTE — ED Notes (Signed)
Pharmacy aware of bleeding and at bedside- Hospitalist contacted by same.

## 2022-12-11 NOTE — Progress Notes (Signed)
OT Cancellation Note  Patient Details Name: [name] MRN: [id number] DOB: Jul 30, 1935   Cancelled Treatment:    Reason Eval/Treat Not Completed: Patient not medically ready OT to check back at a more appropriate time ( noted INR 1.7 with heparin started 2300 7/16 not in therapeutic range)  Mateo Flow 12/11/2022, 1:26 PM

## 2022-12-11 NOTE — ED Provider Notes (Signed)
  Provider Note MRN:  [id number]  Arrival date & time: 12/11/22    ED Course and Medical Decision Making  Assumed care from Dr Eloise Harman at shift change.  See note from prior team for complete details, in brief:  87 yo male Hx cardiomyopathy w/ st jude ICD, afib on eliquis (variable compliance) Found down x2 days w/ aki, b/l PE, pna, ?pyelo on imaging but neg ua, hyperNa Na 155, received 3 L LR  On amio gtt for afib, dig undetectable [facility] recommends ICU, ICU recommends step down  Plan per prior physician f/u PCCM recommendations   PCCM Dr Celine Mans recommends stepdown  [facility] to admit  .Critical Care  Performed by: Sloan Leiter, DO Authorized by: Sloan Leiter, DO   Critical care provider statement:    Critical care time (minutes):  30   Critical care time was exclusive of:  Separately billable procedures and treating other patients   Critical care was necessary to treat or prevent imminent or life-threatening deterioration of the following conditions:  Shock   Critical care was time spent personally by me on the following activities:  Development of treatment plan with patient or surrogate, discussions with consultants, evaluation of patient's response to treatment, examination of patient, ordering and review of laboratory studies, ordering and review of radiographic studies, ordering and performing treatments and interventions, pulse oximetry, re-evaluation of patient's condition, review of old charts and obtaining history from patient or surrogate   Care discussed with: admitting provider     Final Clinical Impressions(s) / ED Diagnoses     ICD-10-CM   1. Fall, initial encounter  W19.XXXA     2. Injury of head, initial encounter  S09.90XA     3. Pneumonia of right lung due to infectious organism, unspecified part of lung  J18.9     4. Septic shock (HCC)  A41.9    R65.21     5. Multiple subsegmental pulmonary emboli without acute cor pulmonale (HCC)  I26.94     6. Atrial  fibrillation with RVR [facility])  I48.91       ED Discharge Orders     None       Discharge Instructions   None        Sloan Leiter, DO 12/11/22 0119

## 2022-12-11 NOTE — Progress Notes (Signed)
  Echocardiogram 2D Echocardiogram has been performed.  [name] 12/11/2022, 12:29 PM

## 2022-12-11 NOTE — Progress Notes (Signed)
The patient is moaning and there's no PRN for pain management. Notified Dr. Imogene Burn and received order, see MAR.

## 2022-12-11 NOTE — ED Notes (Signed)
While verifying current infusions upon arrival to ED 10, heparin pump noted to be infusing at 3000 units/hour.  This RN verified order in [facility] which states 1300 units/hour.  This RN had second RN verify order and pump.  Pump changed back to 1300 units/hour with second RN as verification at this time.  Unsure how long pump has been infusing at higher rate, however pump showing consistent volume left as if infusion had been going at ordered rate for the majority of the time and recently was somehow changed.

## 2022-12-11 NOTE — ED Notes (Signed)
Heparin gtt paused due to significant amount of blood in urine and in and around pt's mouth. Dr. Caleb Popp at bedside for pt evaluation at this time.

## 2022-12-11 NOTE — Progress Notes (Addendum)
PROGRESS NOTE    [name]  WGN:[id number] DOB: 1935/11/22 DOA: 12/10/2022 PCP: Eartha Inch, MD   Brief Narrative: [name] is a 87 y.o. male with a history of nonischemic cardiomyopathy, chronic heart failure with reduced EF, permanent atrial fibrillation on Eliquis, history of biventricular ICD placement, hypertension, hyperlipidemia, COPD.  Patient presented secondary to being found down.  On arrival to the emergency department, patient was found to have atrial fibrillation with rapid ventricular response, tachypnea, elevated temperature with concern for sepsis.  Workup revealed evidence of right upper lobe pneumonia in addition to evidence of bilateral peripheral PE.  Labs are significant for hyponatremia and AKI consistent with significant dehydration.  Patient started on empiric antibiotics in addition to anticoagulation.  Hospitalization complicated by development of hematuria and persistent atrial fibrillation with RVR and hypotension.   Assessment/Plan:  Severe sepsis Present on admission.  Source is presumed to be right lower lobe pneumonia.  Blood cultures obtained on admission.  Patient started empirically on vancomycin, cefepime, Flagyl.  Urinalysis not consistent with urinary tract infection however CT abdomen/pelvis concern for possible bilateral pyelonephritis.  No procalcitonin obtained. Lactic acid as high as 4.5 but is responsive to IV fluids. -Continue empiric antibiotics -Follow-up blood culture results  Right upper lobe pneumonia Present on admission.  Patient started empirically on antibiotics as mentioned above.  Patient evaluated by speech therapy with concern for aspiration risk which could be etiology for pneumonia. -Continue vancomycin, cefepime  Acute metabolic encephalopathy Likely secondary to acute illness and likely contributed to by dehydration and hypernatremia.  Mental status appears to be improving with continued treatment.  Acute  pulmonary embolism Noted on admission on CT angio of the chest.  Peripheral bilateral pulmonary emboli with low clot burden and unlikely right heart strain.  Patient is on Eliquis as an outpatient for atrial fibrillation.  Patient transition to heparin IV for inpatient management. -Follow-up echocardiogram -Hold heparin secondary to development of gross hematuria  Hypovolemic hyponatremia Appears to be secondary to significant dehydration.  Sodium of 156 on admission with associated renal status changes.  Patient started on IV fluids with slow improvement of hyponatremia.  Sodium 152 this morning. -Switch to 1/2 IV fluids  Acute respiratory failure with hypoxia Secondary to evidence of pneumonia on imaging.  Patient was placed on nonrebreather mask for SpO2 in the 70s.  Now weaned off supplemental oxygen.  Respiratory failure appears to be resolved.  Permanent atrial fibrillation with RVR Patient is on metoprolol, Eliquis and digoxin as an outpatient.  Patient with RVR this admission in the setting of sepsis, dehydration.  Complicated also by inability to resume home beta-blocker secondary to hypotension.  Patient continued on digoxin and started on amiodarone IV, complicated by underlying prolonged QTc (patient has evidence of a bundle branch block).  Eliquis held and heparin IV started on admission. -Continue amiodarone IV -Heparin is holding as mentioned above -Cardiology consult  Elevated AST/ALT Hyperbilirubinemia CT abdomen/pelvis without etiology for elevations.  Likely related to hypotension and possible shock liver.  No symptoms associated.  Bilirubin is trending down. -CMP in a.m.  AKI Baseline creatinine of about 1-1.1 from outside records.  Creatinine of 2.15 on admission with slight improvement with IV fluids. -Continue IV fluids  Elevated troponin Troponin of 103 on admission with delta of 85.  No chest pain.  Likely demand ischemia related to fibrillation with RVR in  addition to sepsis.  Chronic heart failure with reduced EF LVEF listed as 40-45%.  Patient  is status post biventricular ICD placement.  BNP this admission of 1420 without prior BNP to compare. History of proBNP of up to 2600 from two years prior. Overall, patient appears to be volume depleted/dehydrated. Patient is Lasix and Entresto as an outpatient, which are held. Transthoracic Echocardiogram ordered this admission.  Fall Patient found down. CK total of 333. -PT/OT evaluation  Diabetes mellitus type 2 Patient with a history of diabetes mellitus.  Currently controlled with hemoglobin A1c of 5.6%.  Patient is not on medication management as an outpatient. -Carb modified diet once able to take p.o.  Hyperlipidemia Patient is on Lipitor as an outpatient which was held on admission secondary to n.p.o. status.  QTc prolongation Noted on EKG.  Complicated by bundle branch block.  Patient is now on amiodarone. -Continue telemetry   DVT prophylaxis: Heparin IV (held) Code Status:   Code Status: Full Code Family Communication: Nephew via telephone Disposition Plan: Discharge likely to facility in 2 to 4 days pending improvement of encephalopathy, control of atrial fibrillation, outpatient antibiotic regimen   Consultants:  Cardiology  Procedures:  None  Antimicrobials: Vancomycin Cefepime Flagyl   Subjective: Patient without chest pain or dyspnea. Nursing noted some blood in the oropharynx and dark urine. Heparin drip was noted to be running higher than ordered.  Objective: BP 106/74   Pulse (!) 105   Temp 97.9 F (36.6 C) (Axillary)   Resp 19   Ht 6\' 3"  (1.905 m)   Wt 77.1 kg   SpO2 96%   BMI 21.25 kg/m   Examination:  General exam: Appears calm and comfortable HEENT: Dried blood noted in oropharynx. Respiratory system: Clear to auscultation. Respiratory effort normal. Cardiovascular system: S1 & S2 heard, RRR. No murmurs, rubs, gallops or  clicks. Gastrointestinal system: Abdomen is nondistended, soft and nontender. Normal bowel sounds heard. Central nervous system: Alert and oriented. No focal neurological deficits. Musculoskeletal: 2+ BLE ankle pitting edema. No calf tenderness Skin: No cyanosis. No rashes Psychiatry: Judgement and insight appear normal. Mood & affect appropriate.    Data Reviewed: I have personally reviewed following labs and imaging studies   Last CBC Lab Results  Component Value Date   WBC 11.3 (H) 12/11/2022   HGB 14.4 12/11/2022   HCT 46.5 12/11/2022   MCV 102.6 (H) 12/11/2022   MCH 31.8 12/11/2022   RDW 16.3 (H) 12/11/2022   PLT 145 (L) 12/11/2022     Last metabolic panel Lab Results  Component Value Date   GLUCOSE 167 (H) 12/11/2022   NA 150 (H) 12/11/2022   K 4.1 12/11/2022   CL 114 (H) 12/11/2022   CO2 22 12/11/2022   BUN 44 (H) 12/11/2022   CREATININE 1.85 (H) 12/11/2022   GFRNONAA 35 (L) 12/11/2022   CALCIUM 8.6 (L) 12/11/2022   PHOS 4.4 07/26/2009   PROT 5.9 (L) 12/11/2022   ALBUMIN 2.8 (L) 12/11/2022   BILITOT 3.3 (H) 12/11/2022   ALKPHOS 120 12/11/2022   AST 333 (H) 12/11/2022   ALT 196 (H) 12/11/2022   ANIONGAP 14 12/11/2022     Creatinine Clearance: Estimated Creatinine Clearance: 32.4 mL/min (A) (by C-G formula based on SCr of 1.85 mg/dL (H)).  Recent Results (from the past 240 hour(s))  Resp panel by RT-PCR (RSV, Flu A&B, Covid) Anterior Nasal Swab     Status: None   Collection Time: 12/10/22  7:30 PM   Specimen: Anterior Nasal Swab  Result Value Ref Range Status   SARS Coronavirus 2 by RT PCR NEGATIVE NEGATIVE  Final   Influenza A by PCR NEGATIVE NEGATIVE Final   Influenza B by PCR NEGATIVE NEGATIVE Final    Comment: (NOTE) The Xpert Xpress SARS-CoV-2/FLU/RSV plus assay is intended as an aid in the diagnosis of influenza from Nasopharyngeal swab specimens and should not be used as a sole basis for treatment. Nasal washings and aspirates are unacceptable  for Xpert Xpress SARS-CoV-2/FLU/RSV testing.  Fact Sheet for Patients: BloggerCourse.com  Fact Sheet for Healthcare Providers: SeriousBroker.it  This test is not yet approved or cleared by the [place] FDA and has been authorized for detection and/or diagnosis of SARS-CoV-2 by FDA under an Emergency Use Authorization (EUA). This EUA will remain in effect (meaning this test can be used) for the duration of the COVID-19 declaration under Section 564(b)(1) of the Act, 21 U.S.C. section 360bbb-3(b)(1), unless the authorization is terminated or revoked.     Resp Syncytial Virus by PCR NEGATIVE NEGATIVE Final    Comment: (NOTE) Fact Sheet for Patients: BloggerCourse.com  Fact Sheet for Healthcare Providers: SeriousBroker.it  This test is not yet approved or cleared by the [place] FDA and has been authorized for detection and/or diagnosis of SARS-CoV-2 by FDA under an Emergency Use Authorization (EUA). This EUA will remain in effect (meaning this test can be used) for the duration of the COVID-19 declaration under Section 564(b)(1) of the Act, 21 U.S.C. section 360bbb-3(b)(1), unless the authorization is terminated or revoked.  Performed at [facility], [address], [place], [place] [address]       Radiology Studies: DG CHEST PORT 1 VIEW  Result Date: 12/11/2022 CLINICAL DATA:  Unwitnessed fall. EXAM: PORTABLE CHEST 1 VIEW COMPARISON:  12/10/2022 FINDINGS: The lungs are clear without focal pneumonia, edema, pneumothorax or pleural effusion. Cardiopericardial silhouette is at upper limits of normal for size. Left pacer/AICD again noted. No acute bony abnormality. Telemetry leads overlie the chest. IMPRESSION: No active disease. Electronically Signed   By: Kennith Center M.D.   On: 12/11/2022 07:35   CT Angio Chest PE W and/or Wo Contrast  Result Date:  12/10/2022 CLINICAL DATA:  Pulmonary embolus suspected with high probability. Fall. Sepsis. EXAM: CT ANGIOGRAPHY CHEST CT ABDOMEN AND PELVIS WITH CONTRAST TECHNIQUE: Multidetector CT imaging of the chest was performed using the standard protocol during bolus administration of intravenous contrast. Multiplanar CT image reconstructions and MIPs were obtained to evaluate the vascular anatomy. Multidetector CT imaging of the abdomen and pelvis was performed using the standard protocol during bolus administration of intravenous contrast. RADIATION DOSE REDUCTION: This exam was performed according to the departmental dose-optimization program which includes automated exposure control, adjustment of the mA and/or kV according to patient size and/or use of iterative reconstruction technique. CONTRAST:  75mL OMNIPAQUE IOHEXOL 350 MG/ML SOLN COMPARISON:  CT chest 07/25/2009 FINDINGS: CTA CHEST FINDINGS Cardiovascular: Good visualization of the central and segmental pulmonary arteries. Filling defects are demonstrated in bilateral lower lobe subsegmental pulmonary arteries suggesting peripheral pulmonary embolus. No large central pulmonary emboli. Clot burden is very low. No contrast material is demonstrated in the left ventricle limiting evaluation of the RV to LV ratio. The cardiac chambers appear grossly symmetrical. Due to low clot burden, right heart strain is unlikely. There is diffuse cardiac enlargement with reflux of contrast material into the hepatic veins suggesting right heart failure. Normal caliber thoracic aorta. Calcification of the aorta and coronary arteries. Mediastinum/Nodes: No enlarged mediastinal, hilar, or axillary lymph nodes. Thyroid gland, trachea, and esophagus demonstrate no significant findings. Venous gas is  present likely resulting from intravenous injections. Cardiac pacemaker. Lungs/Pleura: Diffuse emphysematous changes in the lungs. Bronchial wall thickening consistent with chronic  bronchitis. Patchy airspace infiltrates in the right upper lung likely representing pneumonia. Aspiration could also have this appearance. No pleural effusions. No pneumothorax. Musculoskeletal: Degenerative changes in the spine and shoulders. Review of the MIP images confirms the above findings. CT ABDOMEN and PELVIS FINDINGS Hepatobiliary: No focal liver abnormality is seen. Status post cholecystectomy. No biliary dilatation. Pancreas: Unremarkable. No pancreatic ductal dilatation or surrounding inflammatory changes. Spleen: Normal in size without focal abnormality. Adrenals/Urinary Tract: No adrenal gland nodules. Nephrograms are diffusely heterogeneous and striated bilaterally, likely indicating pyelonephritis. No hydronephrosis or hydroureter. Bladder is decompressed with a Foley catheter in place. Stomach/Bowel: Stomach is within normal limits. Appendix is not identified. No evidence of bowel wall thickening, distention, or inflammatory changes. Vascular/Lymphatic: Diffuse aortic atherosclerosis with diffuse calcification. No aneurysm or dissection. Reproductive: Prostate gland is mildly enlarged. Other: No free air or free fluid in the abdomen. Abdominal wall musculature appears intact. Musculoskeletal: Degenerative changes in the spine. Review of the MIP images confirms the above findings. IMPRESSION: 1. Positive examination for peripheral lower lobe pulmonary emboli. No significant central pulmonary embolus. Low clot burden indicates right heart strain would be unlikely. 2. Airspace disease in the right upper lung is likely pneumonia. 3. Diffuse emphysematous changes in the lungs. Chronic bronchitic changes. 4. Aortic atherosclerosis. 5. Heterogeneous striated nephrograms bilaterally likely indicate pyelonephritis. 6. Prostate gland is enlarged. Critical Value/emergent results were called by telephone at the time of interpretation on 12/10/2022 at 9:43 pm to provider Vonita Moss , who verbally  acknowledged these results. Electronically Signed   By: Burman Nieves M.D.   On: 12/10/2022 21:52   CT ABDOMEN PELVIS W CONTRAST  Result Date: 12/10/2022 CLINICAL DATA:  Pulmonary embolus suspected with high probability. Fall. Sepsis. EXAM: CT ANGIOGRAPHY CHEST CT ABDOMEN AND PELVIS WITH CONTRAST TECHNIQUE: Multidetector CT imaging of the chest was performed using the standard protocol during bolus administration of intravenous contrast. Multiplanar CT image reconstructions and MIPs were obtained to evaluate the vascular anatomy. Multidetector CT imaging of the abdomen and pelvis was performed using the standard protocol during bolus administration of intravenous contrast. RADIATION DOSE REDUCTION: This exam was performed according to the departmental dose-optimization program which includes automated exposure control, adjustment of the mA and/or kV according to patient size and/or use of iterative reconstruction technique. CONTRAST:  75mL OMNIPAQUE IOHEXOL 350 MG/ML SOLN COMPARISON:  CT chest 07/25/2009 FINDINGS: CTA CHEST FINDINGS Cardiovascular: Good visualization of the central and segmental pulmonary arteries. Filling defects are demonstrated in bilateral lower lobe subsegmental pulmonary arteries suggesting peripheral pulmonary embolus. No large central pulmonary emboli. Clot burden is very low. No contrast material is demonstrated in the left ventricle limiting evaluation of the RV to LV ratio. The cardiac chambers appear grossly symmetrical. Due to low clot burden, right heart strain is unlikely. There is diffuse cardiac enlargement with reflux of contrast material into the hepatic veins suggesting right heart failure. Normal caliber thoracic aorta. Calcification of the aorta and coronary arteries. Mediastinum/Nodes: No enlarged mediastinal, hilar, or axillary lymph nodes. Thyroid gland, trachea, and esophagus demonstrate no significant findings. Venous gas is present likely resulting from intravenous  injections. Cardiac pacemaker. Lungs/Pleura: Diffuse emphysematous changes in the lungs. Bronchial wall thickening consistent with chronic bronchitis. Patchy airspace infiltrates in the right upper lung likely representing pneumonia. Aspiration could also have this appearance. No pleural effusions. No pneumothorax. Musculoskeletal: Degenerative changes in the  spine and shoulders. Review of the MIP images confirms the above findings. CT ABDOMEN and PELVIS FINDINGS Hepatobiliary: No focal liver abnormality is seen. Status post cholecystectomy. No biliary dilatation. Pancreas: Unremarkable. No pancreatic ductal dilatation or surrounding inflammatory changes. Spleen: Normal in size without focal abnormality. Adrenals/Urinary Tract: No adrenal gland nodules. Nephrograms are diffusely heterogeneous and striated bilaterally, likely indicating pyelonephritis. No hydronephrosis or hydroureter. Bladder is decompressed with a Foley catheter in place. Stomach/Bowel: Stomach is within normal limits. Appendix is not identified. No evidence of bowel wall thickening, distention, or inflammatory changes. Vascular/Lymphatic: Diffuse aortic atherosclerosis with diffuse calcification. No aneurysm or dissection. Reproductive: Prostate gland is mildly enlarged. Other: No free air or free fluid in the abdomen. Abdominal wall musculature appears intact. Musculoskeletal: Degenerative changes in the spine. Review of the MIP images confirms the above findings. IMPRESSION: 1. Positive examination for peripheral lower lobe pulmonary emboli. No significant central pulmonary embolus. Low clot burden indicates right heart strain would be unlikely. 2. Airspace disease in the right upper lung is likely pneumonia. 3. Diffuse emphysematous changes in the lungs. Chronic bronchitic changes. 4. Aortic atherosclerosis. 5. Heterogeneous striated nephrograms bilaterally likely indicate pyelonephritis. 6. Prostate gland is enlarged. Critical Value/emergent  results were called by telephone at the time of interpretation on 12/10/2022 at 9:43 pm to provider Vonita Moss , who verbally acknowledged these results. Electronically Signed   By: Burman Nieves M.D.   On: 12/10/2022 21:52   CT Head Wo Contrast  Result Date: 12/10/2022 CLINICAL DATA:  Fall EXAM: CT HEAD WITHOUT CONTRAST CT CERVICAL SPINE WITHOUT CONTRAST TECHNIQUE: Multidetector CT imaging of the head and cervical spine was performed following the standard protocol without intravenous contrast. Multiplanar CT image reconstructions of the cervical spine were also generated. RADIATION DOSE REDUCTION: This exam was performed according to the departmental dose-optimization program which includes automated exposure control, adjustment of the mA and/or kV according to patient size and/or use of iterative reconstruction technique. COMPARISON:  None Available. FINDINGS: CT HEAD FINDINGS Brain: There is no mass, hemorrhage or extra-axial collection. There is generalized atrophy without lobar predilection. There is hypoattenuation of the periventricular white matter, most commonly indicating chronic ischemic microangiopathy. Old left occipital infarct. Vascular: Atherosclerotic calcification of the vertebral and internal carotid arteries at the skull base. No abnormal hyperdensity of the major intracranial arteries or dural venous sinuses. Skull: The visualized skull base, calvarium and extracranial soft tissues are normal. Sinuses/Orbits: No fluid levels or advanced mucosal thickening of the visualized paranasal sinuses. No mastoid or middle ear effusion. The orbits are normal. CT CERVICAL SPINE FINDINGS Alignment: Reversal of normal cervical lordosis. Grade 1 anterolisthesis at C3-4 and grade 1 retrolisthesis at C4-5 and C5-6. Skull base and vertebrae: No acute fracture. Soft tissues and spinal canal: No prevertebral fluid or swelling. No visible canal hematoma. Disc levels: No advanced spinal canal or neural  foraminal stenosis. Upper chest: Severe facet arthrosis at C2-3 and C3-4. No high-grade spinal canal stenosis. Other: Normal visualized paraspinal cervical soft tissues. IMPRESSION: 1. No acute intracranial abnormality. 2. 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 3. No acute fracture or static subluxation of the cervical spine. Electronically Signed   By: Deatra Robinson M.D.   On: 12/10/2022 21:33   CT Cervical Spine Wo Contrast  Result Date: 12/10/2022 CLINICAL DATA:  Fall EXAM: CT HEAD WITHOUT CONTRAST CT CERVICAL SPINE WITHOUT CONTRAST TECHNIQUE: Multidetector CT imaging of the head and cervical spine was performed following the standard protocol without intravenous contrast. Multiplanar CT  image reconstructions of the cervical spine were also generated. RADIATION DOSE REDUCTION: This exam was performed according to the departmental dose-optimization program which includes automated exposure control, adjustment of the mA and/or kV according to patient size and/or use of iterative reconstruction technique. COMPARISON:  None Available. FINDINGS: CT HEAD FINDINGS Brain: There is no mass, hemorrhage or extra-axial collection. There is generalized atrophy without lobar predilection. There is hypoattenuation of the periventricular white matter, most commonly indicating chronic ischemic microangiopathy. Old left occipital infarct. Vascular: Atherosclerotic calcification of the vertebral and internal carotid arteries at the skull base. No abnormal hyperdensity of the major intracranial arteries or dural venous sinuses. Skull: The visualized skull base, calvarium and extracranial soft tissues are normal. Sinuses/Orbits: No fluid levels or advanced mucosal thickening of the visualized paranasal sinuses. No mastoid or middle ear effusion. The orbits are normal. CT CERVICAL SPINE FINDINGS Alignment: Reversal of normal cervical lordosis. Grade 1 anterolisthesis at C3-4 and grade 1 retrolisthesis at  C4-5 and C5-6. Skull base and vertebrae: No acute fracture. Soft tissues and spinal canal: No prevertebral fluid or swelling. No visible canal hematoma. Disc levels: No advanced spinal canal or neural foraminal stenosis. Upper chest: Severe facet arthrosis at C2-3 and C3-4. No high-grade spinal canal stenosis. Other: Normal visualized paraspinal cervical soft tissues. IMPRESSION: 1. No acute intracranial abnormality. 2. 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 3. No acute fracture or static subluxation of the cervical spine. Electronically Signed   By: Deatra Robinson M.D.   On: 12/10/2022 21:33   DG Chest Portable 1 View  Result Date: 12/10/2022 CLINICAL DATA:  Altered mental status EXAM: PORTABLE CHEST 1 VIEW COMPARISON:  05/03/2010 FINDINGS: Interim placement of left-sided pacing device. Borderline cardiac size. Aortic atherosclerosis. No acute airspace disease, pleural effusion or pneumothorax. IMPRESSION: No active disease. Electronically Signed   By: Jasmine Pang M.D.   On: 12/10/2022 20:44      LOS: 0 days    Jacquelin Hawking, MD [facility] 12/11/2022, 8:31 AM   If 7PM-7AM, please contact night-coverage [email or web address]

## 2022-12-11 NOTE — Progress Notes (Signed)
ANTICOAGULATION CONSULT NOTE - Initial Consult  Pharmacy Consult for heparin Indication: atrial fibrillation and pulmonary embolus  Allergies  Allergen Reactions   Lisinopril    Prednisone     psychosis   Glucosamine Rash    Patient Measurements: Height: 6\' 3"  (190.5 cm) Weight: 77.1 kg (170 lb) IBW/kg (Calculated) : 84.5 Heparin Dosing Weight: TBW  Vital Signs: Temp: 97.9 F (36.6 C) (07/17 0416) Temp Source: Axillary (07/17 0416) BP: 104/72 (07/17 0900) Pulse Rate: 25 (07/17 0900)  Labs: Recent Labs    12/10/22 1928 12/10/22 1938 12/10/22 2215 12/10/22 2331 12/11/22 0344 12/11/22 0823 12/11/22 0830  HGB 14.1 14.3  14.3  --   --  14.4 14.5 15.3  HCT 44.2 42.0  42.0  --   --  46.5 47.8 45.0  PLT 179  --   --   --  145* 155  --   APTT 31  --   --  33  --   --   --   LABPROT 19.7*  --   --   --   --   --   --   INR 1.7*  --   --   --   --   --   --   HEPARINUNFRC  --   --   --  <0.10*  --  0.45  --   CREATININE 2.15* 2.10*  --   --  1.85*  --   --   CKTOTAL 333  --   --   --   --   --   --   TROPONINIHS 103*  --  85*  --   --   --   --     Estimated Creatinine Clearance: 32.4 mL/min (A) (by C-G formula based on SCr of 1.85 mg/dL (H)).   Medical History: Past Medical History:  Diagnosis Date   CAD (coronary artery disease)    Nonobstructive   COPD (chronic obstructive pulmonary disease) (HCC)    HLD (hyperlipidemia)    HTN (hypertension)    NICM (nonischemic cardiomyopathy) (HCC)    S/p [employer] AICD placement   Permanent atrial fibrillation (HCC)    Assessment: 86 YOM presenting after being found down, CT angio chest with PE (lower lobe peripheral, low clot burden), hx of afib on Eliquis PTA however baseline Anti-Xa level on admission undetectable suggesting no recent doses PTA.    Shift change RN notified pharmacy of incorrect rate in pump, for unknown period of time, also noted blood in urine and bleeding around mouth.  Contacted MD and agreement on  holding heparin gtt, obtaining heparin level and awaiting MD eval.  Heparin level came back therapeutic which may indicate heparin gtt was running at correct rate most of the time overnight.    Goal of Therapy:  Heparin level 0.3-0.7 units/ml Monitor platelets by anticoagulation protocol: Yes   Plan:  Continue to hold heparin gtt for now per MD F/u hematuria, CBC and AC plan  Daylene Posey, PharmD, [facility] Clinical Pharmacist ED Pharmacist Phone # [phone] 12/11/2022 12:54 PM

## 2022-12-11 NOTE — Evaluation (Signed)
Clinical/Bedside Swallow Evaluation Patient Details  Name: [name] MRN: [id number] Date of Birth: 12-Jul-1935  Today's Date: 12/11/2022 Time: SLP Start Time (ACUTE ONLY): 4098 SLP Stop Time (ACUTE ONLY): 1002 SLP Time Calculation (min) (ACUTE ONLY): 20 min  Past Medical History:  Past Medical History:  Diagnosis Date   CAD (coronary artery disease)    Nonobstructive   COPD (chronic obstructive pulmonary disease) (HCC)    HLD (hyperlipidemia)    HTN (hypertension)    NICM (nonischemic cardiomyopathy) (HCC)    S/p [employer] AICD placement   Permanent atrial fibrillation (HCC)    Past Surgical History: History reviewed. No pertinent surgical history. HPI:  [name] is a 87 y.o. male with medical history significant for nonischemic cardiomyopathy, chronic HFrEF, permanent atrial fibrillation, biventricular ICD placement, hypertension, hyperlipidemia, COPD, who presents via EMS after being found down by neighbors after 2 days. Found to be hypoxic with O2 saturation of 85% on room air. Code sepsis was called in the ED. Acute pulmonary embolism seen on CT angio chest. Noncontrast head CT was nonacute.  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    Assessment / Plan / Recommendation  Clinical Impression  Pt appears deconditioned/weak, hard of hearing with intact dentition. He is demonstrating signs of pharyngeal dysphagia with both thin liquids and nectar. There was immediate cough with thin and multiple swallows suggestive of pharyngeal residue. Nectar thick he had delayed coughs and pt had intermittent eructation noting possible esophageal involvement. No overt signs with puree and slow oral transit. Pt talking while masticating needing cues to refrain from verbalizing. Pt needs instrumental assessment of swallow prior to recommending po's however radiology unable to accommodate today. Recommend  continue NPO except for crushed meds in applesauce and  plan for MBS tomorrow. Continue oral care. SLP Visit Diagnosis: Dysphagia, unspecified (R13.10)    Aspiration Risk  Moderate aspiration risk    Diet Recommendation NPO except meds;Other (Comment) (can have meds crushed)    Medication Administration: Crushed with puree    Other  Recommendations Oral Care Recommendations: Oral care QID    Recommendations for follow up therapy are one component of a multi-disciplinary discharge planning process, led by the attending physician.  Recommendations may be updated based on patient status, additional functional criteria and insurance authorization.  Follow up Recommendations        Assistance Recommended at Discharge    Functional Status Assessment Patient has had a recent decline in their functional status and demonstrates the ability to make significant improvements in function in a reasonable and predictable amount of time.  Frequency and Duration min 2x/week  2 weeks       Prognosis Barriers to Reach Goals: Cognitive deficits      Swallow Study   General HPI: [name] is a 87 y.o. male with medical history significant for nonischemic cardiomyopathy, chronic HFrEF, permanent atrial fibrillation, biventricular ICD placement, hypertension, hyperlipidemia, COPD, who presents via EMS after being found down by neighbors after 2 days. Found to be hypoxic with O2 saturation of 85% on room air. Code sepsis was called in the ED. Acute pulmonary embolism seen on CT angio chest. Noncontrast head CT was nonacute.  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 Type of Study: Bedside Swallow Evaluation Previous Swallow Assessment: none Diet Prior to this Study: NPO Temperature Spikes Noted: No Respiratory Status: Room air History of Recent Intubation: No Behavior/Cognition: Alert;Cooperative;Requires cueing Oral Cavity Assessment: Other (comment) (some blood on tongue)  Oral Care Completed by SLP: Recent  completion by staff Oral Cavity - Dentition: Adequate natural dentition Vision: Functional for self-feeding Self-Feeding Abilities: Needs assist Patient Positioning: Upright in bed Baseline Vocal Quality: Normal    Oral/Motor/Sensory Function Overall Oral Motor/Sensory Function: Generalized oral weakness   Ice Chips Ice chips: Not tested   Thin Liquid Thin Liquid: Impaired Presentation: Cup;Spoon Pharyngeal  Phase Impairments: Cough - Immediate;Multiple swallows;Decreased hyoid-laryngeal movement    Nectar Thick Nectar Thick Liquid: Impaired Presentation: Cup;Straw;Spoon Pharyngeal Phase Impairments: Multiple swallows;Cough - Delayed   Honey Thick Honey Thick Liquid: Not tested   Puree Puree: Within functional limits   Solid     Solid: Impaired Oral Phase Impairments: Reduced lingual movement/coordination Oral Phase Functional Implications: Prolonged oral transit      Royce Macadamia 12/11/2022,10:27 AM

## 2022-12-11 NOTE — H&P (Addendum)
History and Physical  [name] UJW:[id number] DOB: [birth year] DOA: 12/10/2022  Referring physician: Dr. Eloise Harman, EDP  PCP: Eartha Inch, MD  Outpatient Specialists: Cardiology Patient coming from: Home  Chief Complaint: Fall, AMS   HPI: [name] is a 87 y.o. male with medical history significant for nonischemic cardiomyopathy, chronic HFrEF, permanent atrial fibrillation on Eliquis, history of biventricular ICD placement, hypertension, hyperlipidemia, COPD, who presents via EMS after being found down by neighbors after 2 days of not being seen.  Upon EMS arrival, the patient was hypoxic with O2 saturation of 85% on room air.  The patient was placed on nonrebreather and weaned down to 4 L nasal cannula.  Presented to the ED as a level 2 trauma.  In the ED, febrile with Tmax 100.7, soft BPs, tachycardic, rhythm in A-fib with RVR, tachypneic with respiration rate of 24.  Code sepsis was called in the ED.  Broad-spectrum IV antibiotic were initiated, IV vancomycin, IV Flagyl, and cefepime.  Started on amiodarone drip due to A-fib with RVR.  Initial lactic acid 4.1.  Heparin drip was also initiated because of NPO status due to lethargic, in the setting of permanent A-fib and acute pulmonary embolism seen on CT angio chest.  Noncontrast head CT was nonacute.  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  No acute fracture or static subluxation of the cervical spine.    ED Course: Tmax 100.7.  BP 94/61, pulse 101, respiratory rate 20, saturation 96% on nonrebreather.  Serum sodium 150, glucose 167, BUN 44, creatinine 1.85.  AST 333, ALT 196, T bilirubin 3.3.  Lactic acid 3.5.  WBC 11.3.  Hemoglobin 14.4, MCV 102.6.  Review of Systems: Review of systems as noted in the HPI. All other systems reviewed and are negative.   Past Medical History:  Diagnosis Date   CAD (coronary artery disease)    Nonobstructive   COPD (chronic obstructive pulmonary  disease) (HCC)    HLD (hyperlipidemia)    HTN (hypertension)    NICM (nonischemic cardiomyopathy) (HCC)    S/p [employer] AICD placement   Permanent atrial fibrillation (HCC)    History reviewed. No pertinent surgical history.  Social History:  reports that he has never smoked. He has never used smokeless tobacco. No history on file for alcohol use and drug use.   Allergies  Allergen Reactions   Lisinopril    Prednisone     psychosis   Glucosamine Rash    Family history: None reported.  Prior to Admission medications   Medication Sig Start Date End Date Taking? Authorizing Provider  apixaban (ELIQUIS) 5 MG TABS tablet Take 5 mg by mouth 2 (two) times daily. 03/10/17   [provider]  atorvastatin (LIPITOR) 80 MG tablet Take 40 mg by mouth daily. 03/20/18   [provider]  b complex vitamins tablet Take 1 tablet by mouth daily.    [provider]  Cyanocobalamin 1000 MCG CAPS Take 1 tablet by mouth daily.    [provider]  digoxin (LANOXIN) 0.125 MG tablet Take 0.125 mg by mouth daily. 02/13/18   [provider]  digoxin (LANOXIN) 0.125 MG tablet Take 0.125 mg by mouth. 01/27/18   [provider]  furosemide (LASIX) 20 MG tablet Take 20 mg by mouth daily. 02/16/18   [provider]  gentamicin cream (GARAMYCIN) 0.1 % Apply 1 application topically 2 (two) times daily. 04/22/18   Felecia Shelling, DPM  meloxicam (MOBIC) 15 MG tablet Take  15 mg by mouth daily. 10/13/17   [provider]  metoprolol succinate (TOPROL-XL) 100 MG 24 hr tablet Take 100 mg by mouth daily. 10/13/17   [provider]  sacubitril-valsartan (ENTRESTO) 24-26 MG Take 1 tablet by mouth 2 (two) times daily. 03/26/18   [provider]  Saw Palmetto 1000 MG CAPS Take 1,000 mg by mouth daily.    [provider]  sildenafil (VIAGRA) 100 MG tablet Take by mouth. 10/14/17   [provider]  tadalafil (ADCIRCA/CIALIS) 20  MG tablet Take 20 mg by mouth daily as needed for erectile dysfunction.    [provider]  tamsulosin (FLOMAX) 0.4 MG CAPS capsule Take 0.4 mg by mouth daily. 01/16/18   [provider]  vitamin C (ASCORBIC ACID) 500 MG tablet Take 500 mg by mouth daily.    [provider]  vitamin E 100 UNIT capsule Take 100 Units by mouth daily.    [provider]    Physical Exam: BP (!) 128/117   Pulse (!) 133   Temp 97.9 F (36.6 C) (Axillary)   Resp 19   Ht 6\' 3"  (1.905 m)   Wt 77.1 kg   SpO2 100%   BMI 21.25 kg/m   General: 87 y.o. year-old male Frail in no acute distress.  Lethargic but arouses to voices.  Confused Cardiovascular: Irregular rate and rhythm with no rubs or gallops.  No thyromegaly or JVD noted.  No lower extremity edema bilaterally. Respiratory: Clear to auscultation with no wheezes or rales. Good inspiratory effort. Abdomen: Soft nontender nondistended with normal bowel sounds x4 quadrants. Muskuloskeletal: No cyanosis, clubbing or edema noted bilaterally Neuro: CN II-XII intact, strength, sensation, reflexes Skin: No ulcerative lesions noted or rashes Psychiatry: Judgement and insight appear altered. Mood is appropriate for condition and setting          Labs on Admission:  Basic Metabolic Panel: Recent Labs  Lab 12/10/22 1928 12/10/22 1938 12/11/22 0344  NA 156* 155*  155* 150*  K 4.6 5.0  5.0 4.1  CL 118* 119* 114*  CO2 21*  --  22  GLUCOSE 134* 132* 167*  BUN 44* 57* 44*  CREATININE 2.15* 2.10* 1.85*  CALCIUM 8.8*  --  8.6*  MG 2.2  --   --    Liver Function Tests: Recent Labs  Lab 12/10/22 1928 12/11/22 0344  AST 327* 333*  ALT 173* 196*  ALKPHOS 109 120  BILITOT 3.9* 3.3*  PROT 6.2* 5.9*  ALBUMIN 3.0* 2.8*   No results for input(s): "LIPASE", "AMYLASE" in the last 168 hours. No results for input(s): "AMMONIA" in the last 168 hours. CBC: Recent Labs  Lab 12/10/22 1928 12/10/22 1938 12/11/22 0344  WBC  9.7  --  11.3*  HGB 14.1 14.3  14.3 14.4  HCT 44.2 42.0  42.0 46.5  MCV 98.7  --  102.6*  PLT 179  --  145*   Cardiac Enzymes: Recent Labs  Lab 12/10/22 1928  CKTOTAL 333    BNP (last 3 results) Recent Labs    12/10/22 1928  BNP 1,420.9*    ProBNP (last 3 results) No results for input(s): "PROBNP" in the last 8760 hours.  CBG: No results for input(s): "GLUCAP" in the last 168 hours.  Radiological Exams on Admission: CT Angio Chest PE W and/or Wo Contrast  Result Date: 12/10/2022 CLINICAL DATA:  Pulmonary embolus suspected with high probability. Fall. Sepsis. EXAM: CT ANGIOGRAPHY CHEST CT ABDOMEN AND PELVIS WITH CONTRAST TECHNIQUE: Multidetector  CT imaging of the chest was performed using the standard protocol during bolus administration of intravenous contrast. Multiplanar CT image reconstructions and MIPs were obtained to evaluate the vascular anatomy. Multidetector CT imaging of the abdomen and pelvis was performed using the standard protocol during bolus administration of intravenous contrast. RADIATION DOSE REDUCTION: This exam was performed according to the departmental dose-optimization program which includes automated exposure control, adjustment of the mA and/or kV according to patient size and/or use of iterative reconstruction technique. CONTRAST:  75mL OMNIPAQUE IOHEXOL 350 MG/ML SOLN COMPARISON:  CT chest 07/25/2009 FINDINGS: CTA CHEST FINDINGS Cardiovascular: Good visualization of the central and segmental pulmonary arteries. Filling defects are demonstrated in bilateral lower lobe subsegmental pulmonary arteries suggesting peripheral pulmonary embolus. No large central pulmonary emboli. Clot burden is very low. No contrast material is demonstrated in the left ventricle limiting evaluation of the RV to LV ratio. The cardiac chambers appear grossly symmetrical. Due to low clot burden, right heart strain is unlikely. There is diffuse cardiac enlargement with reflux of  contrast material into the hepatic veins suggesting right heart failure. Normal caliber thoracic aorta. Calcification of the aorta and coronary arteries. Mediastinum/Nodes: No enlarged mediastinal, hilar, or axillary lymph nodes. Thyroid gland, trachea, and esophagus demonstrate no significant findings. Venous gas is present likely resulting from intravenous injections. Cardiac pacemaker. Lungs/Pleura: Diffuse emphysematous changes in the lungs. Bronchial wall thickening consistent with chronic bronchitis. Patchy airspace infiltrates in the right upper lung likely representing pneumonia. Aspiration could also have this appearance. No pleural effusions. No pneumothorax. Musculoskeletal: Degenerative changes in the spine and shoulders. Review of the MIP images confirms the above findings. CT ABDOMEN and PELVIS FINDINGS Hepatobiliary: No focal liver abnormality is seen. Status post cholecystectomy. No biliary dilatation. Pancreas: Unremarkable. No pancreatic ductal dilatation or surrounding inflammatory changes. Spleen: Normal in size without focal abnormality. Adrenals/Urinary Tract: No adrenal gland nodules. Nephrograms are diffusely heterogeneous and striated bilaterally, likely indicating pyelonephritis. No hydronephrosis or hydroureter. Bladder is decompressed with a Foley catheter in place. Stomach/Bowel: Stomach is within normal limits. Appendix is not identified. No evidence of bowel wall thickening, distention, or inflammatory changes. Vascular/Lymphatic: Diffuse aortic atherosclerosis with diffuse calcification. No aneurysm or dissection. Reproductive: Prostate gland is mildly enlarged. Other: No free air or free fluid in the abdomen. Abdominal wall musculature appears intact. Musculoskeletal: Degenerative changes in the spine. Review of the MIP images confirms the above findings. IMPRESSION: 1. Positive examination for peripheral lower lobe pulmonary emboli. No significant central pulmonary embolus. Low clot  burden indicates right heart strain would be unlikely. 2. Airspace disease in the right upper lung is likely pneumonia. 3. Diffuse emphysematous changes in the lungs. Chronic bronchitic changes. 4. Aortic atherosclerosis. 5. Heterogeneous striated nephrograms bilaterally likely indicate pyelonephritis. 6. Prostate gland is enlarged. Critical Value/emergent results were called by telephone at the time of interpretation on 12/10/2022 at 9:43 pm to provider Vonita Moss , who verbally acknowledged these results. Electronically Signed   By: Burman Nieves M.D.   On: 12/10/2022 21:52   CT ABDOMEN PELVIS W CONTRAST  Result Date: 12/10/2022 CLINICAL DATA:  Pulmonary embolus suspected with high probability. Fall. Sepsis. EXAM: CT ANGIOGRAPHY CHEST CT ABDOMEN AND PELVIS WITH CONTRAST TECHNIQUE: Multidetector CT imaging of the chest was performed using the standard protocol during bolus administration of intravenous contrast. Multiplanar CT image reconstructions and MIPs were obtained to evaluate the vascular anatomy. Multidetector CT imaging of the abdomen and pelvis was performed using the standard protocol during bolus administration of intravenous contrast.  RADIATION DOSE REDUCTION: This exam was performed according to the departmental dose-optimization program which includes automated exposure control, adjustment of the mA and/or kV according to patient size and/or use of iterative reconstruction technique. CONTRAST:  75mL OMNIPAQUE IOHEXOL 350 MG/ML SOLN COMPARISON:  CT chest 07/25/2009 FINDINGS: CTA CHEST FINDINGS Cardiovascular: Good visualization of the central and segmental pulmonary arteries. Filling defects are demonstrated in bilateral lower lobe subsegmental pulmonary arteries suggesting peripheral pulmonary embolus. No large central pulmonary emboli. Clot burden is very low. No contrast material is demonstrated in the left ventricle limiting evaluation of the RV to LV ratio. The cardiac chambers appear  grossly symmetrical. Due to low clot burden, right heart strain is unlikely. There is diffuse cardiac enlargement with reflux of contrast material into the hepatic veins suggesting right heart failure. Normal caliber thoracic aorta. Calcification of the aorta and coronary arteries. Mediastinum/Nodes: No enlarged mediastinal, hilar, or axillary lymph nodes. Thyroid gland, trachea, and esophagus demonstrate no significant findings. Venous gas is present likely resulting from intravenous injections. Cardiac pacemaker. Lungs/Pleura: Diffuse emphysematous changes in the lungs. Bronchial wall thickening consistent with chronic bronchitis. Patchy airspace infiltrates in the right upper lung likely representing pneumonia. Aspiration could also have this appearance. No pleural effusions. No pneumothorax. Musculoskeletal: Degenerative changes in the spine and shoulders. Review of the MIP images confirms the above findings. CT ABDOMEN and PELVIS FINDINGS Hepatobiliary: No focal liver abnormality is seen. Status post cholecystectomy. No biliary dilatation. Pancreas: Unremarkable. No pancreatic ductal dilatation or surrounding inflammatory changes. Spleen: Normal in size without focal abnormality. Adrenals/Urinary Tract: No adrenal gland nodules. Nephrograms are diffusely heterogeneous and striated bilaterally, likely indicating pyelonephritis. No hydronephrosis or hydroureter. Bladder is decompressed with a Foley catheter in place. Stomach/Bowel: Stomach is within normal limits. Appendix is not identified. No evidence of bowel wall thickening, distention, or inflammatory changes. Vascular/Lymphatic: Diffuse aortic atherosclerosis with diffuse calcification. No aneurysm or dissection. Reproductive: Prostate gland is mildly enlarged. Other: No free air or free fluid in the abdomen. Abdominal wall musculature appears intact. Musculoskeletal: Degenerative changes in the spine. Review of the MIP images confirms the above findings.  IMPRESSION: 1. Positive examination for peripheral lower lobe pulmonary emboli. No significant central pulmonary embolus. Low clot burden indicates right heart strain would be unlikely. 2. Airspace disease in the right upper lung is likely pneumonia. 3. Diffuse emphysematous changes in the lungs. Chronic bronchitic changes. 4. Aortic atherosclerosis. 5. Heterogeneous striated nephrograms bilaterally likely indicate pyelonephritis. 6. Prostate gland is enlarged. Critical Value/emergent results were called by telephone at the time of interpretation on 12/10/2022 at 9:43 pm to provider Vonita Moss , who verbally acknowledged these results. Electronically Signed   By: Burman Nieves M.D.   On: 12/10/2022 21:52   CT Head Wo Contrast  Result Date: 12/10/2022 CLINICAL DATA:  Fall EXAM: CT HEAD WITHOUT CONTRAST CT CERVICAL SPINE WITHOUT CONTRAST TECHNIQUE: Multidetector CT imaging of the head and cervical spine was performed following the standard protocol without intravenous contrast. Multiplanar CT image reconstructions of the cervical spine were also generated. RADIATION DOSE REDUCTION: This exam was performed according to the departmental dose-optimization program which includes automated exposure control, adjustment of the mA and/or kV according to patient size and/or use of iterative reconstruction technique. COMPARISON:  None Available. FINDINGS: CT HEAD FINDINGS Brain: There is no mass, hemorrhage or extra-axial collection. There is generalized atrophy without lobar predilection. There is hypoattenuation of the periventricular white matter, most commonly indicating chronic ischemic microangiopathy. Old left occipital infarct. Vascular: Atherosclerotic calcification of  the vertebral and internal carotid arteries at the skull base. No abnormal hyperdensity of the major intracranial arteries or dural venous sinuses. Skull: The visualized skull base, calvarium and extracranial soft tissues are normal.  Sinuses/Orbits: No fluid levels or advanced mucosal thickening of the visualized paranasal sinuses. No mastoid or middle ear effusion. The orbits are normal. CT CERVICAL SPINE FINDINGS Alignment: Reversal of normal cervical lordosis. Grade 1 anterolisthesis at C3-4 and grade 1 retrolisthesis at C4-5 and C5-6. Skull base and vertebrae: No acute fracture. Soft tissues and spinal canal: No prevertebral fluid or swelling. No visible canal hematoma. Disc levels: No advanced spinal canal or neural foraminal stenosis. Upper chest: Severe facet arthrosis at C2-3 and C3-4. No high-grade spinal canal stenosis. Other: Normal visualized paraspinal cervical soft tissues. IMPRESSION: 1. No acute intracranial abnormality. 2. 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 3. No acute fracture or static subluxation of the cervical spine. Electronically Signed   By: Deatra Robinson M.D.   On: 12/10/2022 21:33   CT Cervical Spine Wo Contrast  Result Date: 12/10/2022 CLINICAL DATA:  Fall EXAM: CT HEAD WITHOUT CONTRAST CT CERVICAL SPINE WITHOUT CONTRAST TECHNIQUE: Multidetector CT imaging of the head and cervical spine was performed following the standard protocol without intravenous contrast. Multiplanar CT image reconstructions of the cervical spine were also generated. RADIATION DOSE REDUCTION: This exam was performed according to the departmental dose-optimization program which includes automated exposure control, adjustment of the mA and/or kV according to patient size and/or use of iterative reconstruction technique. COMPARISON:  None Available. FINDINGS: CT HEAD FINDINGS Brain: There is no mass, hemorrhage or extra-axial collection. There is generalized atrophy without lobar predilection. There is hypoattenuation of the periventricular white matter, most commonly indicating chronic ischemic microangiopathy. Old left occipital infarct. Vascular: Atherosclerotic calcification of the vertebral and internal  carotid arteries at the skull base. No abnormal hyperdensity of the major intracranial arteries or dural venous sinuses. Skull: The visualized skull base, calvarium and extracranial soft tissues are normal. Sinuses/Orbits: No fluid levels or advanced mucosal thickening of the visualized paranasal sinuses. No mastoid or middle ear effusion. The orbits are normal. CT CERVICAL SPINE FINDINGS Alignment: Reversal of normal cervical lordosis. Grade 1 anterolisthesis at C3-4 and grade 1 retrolisthesis at C4-5 and C5-6. Skull base and vertebrae: No acute fracture. Soft tissues and spinal canal: No prevertebral fluid or swelling. No visible canal hematoma. Disc levels: No advanced spinal canal or neural foraminal stenosis. Upper chest: Severe facet arthrosis at C2-3 and C3-4. No high-grade spinal canal stenosis. Other: Normal visualized paraspinal cervical soft tissues. IMPRESSION: 1. No acute intracranial abnormality. 2. 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 3. No acute fracture or static subluxation of the cervical spine. Electronically Signed   By: Deatra Robinson M.D.   On: 12/10/2022 21:33   DG Chest Portable 1 View  Result Date: 12/10/2022 CLINICAL DATA:  Altered mental status EXAM: PORTABLE CHEST 1 VIEW COMPARISON:  05/03/2010 FINDINGS: Interim placement of left-sided pacing device. Borderline cardiac size. Aortic atherosclerosis. No acute airspace disease, pleural effusion or pneumothorax. IMPRESSION: No active disease. Electronically Signed   By: Jasmine Pang M.D.   On: 12/10/2022 20:44     Assessment/Plan Present on Admission:  Atrial fibrillation with RVR (HCC)  AMS (altered mental status)  Principal Problem:   AMS (altered mental status) Active Problems:   Atrial fibrillation with RVR (HCC)   Multiple subsegmental pulmonary emboli without acute cor pulmonale (HCC)   Pneumonia of right lung due to  infectious organism   Septic shock (HCC)  Sepsis due to right lower lobe  pneumonia, seen on CT scan, POA Lactic acidosis, fever, tachycardia, tachypnea Continue IV antibiotics started in the ED Obtain peripheral blood cultures and urine culture Maintain MAP greater than 65  Acute metabolic encephalopathy in the setting of sepsis Treat underlying conditions Swallow evaluation at bedside prior to feedings  Acute pulmonary embolism without R heart strain seen on CT angio chest. Patient on DOAC, Eliquis, prior to admission, unclear if he was compliant Possibly missed doses when he was down Continue heparin drip  Acute hypoxic respiratory failure secondary to pneumonia versus acute PE, versus others Treat underlying conditions Wean off oxygen supplementation as tolerated Repeat chest x-ray and VBG  Permanent A-fib with RVR Continue amiodarone and heparin drip Cardiology consulted by EDP  Hypovolemic hypernatremia Presented with serum sodium of 155 Improved with IV fluid hydration  AKI, likely prerenal in the setting of severe dehydration Creatinine is downtrending 1.85 from 2.10 Continue to monitor urine output Continue to avoid nephrotoxic agents  Elevated troponin in the setting of sepsis No reported anginal symptoms. Continue to treat sepsis  Chronic HFrEF Elevated BNP greater than 1400 Does not appear volume overloaded on exam Start strict I's and O's and daily weight Closely monitor volume status, received IV fluid Obtain 2D echo, follow chest x-ray to rule out pulm edema  Fall Physical debility PT OT evaluation Palliative care team to assist with establishing goals of care Continue fall precautions  Type 2 diabetes with hyperglycemia Serum glucose 167 Swallow evaluation prior to feeding Continue aspiration precautions Not on insulin prior to admission, will hold off insulin for now while n.p.o. to avoid hypoglycemia.  QTc prolongation Admission twelve-lead EKG with QTc of 540. Avoid QTc prolonging agents Optimize magnesium level  greater than 2.0 Optimize potassium level greater than 4.0 Monitor on telemetry  Elevated liver chemistries Unclear etiology CT abdomen pelvis was unrevealing Trend LFTs Avoid hepatotoxic agents   Time: 75 minutes.   DVT prophylaxis: Heparin drip  Code Status: Full code by default.  No family members at bedside.  Family Communication: None at bedside.  Disposition Plan: Admitted to progressive care unit  Consults called: Cardiology, critical care medicine.  Admission status: Inpatient status.   Status is: Inpatient The patient requires at least 2 midnights for further evaluation and treatment of present condition.   Darlin Drop MD [facility] Pager [phone]  If 7PM-7AM, please contact night-coverage [email or web address] Password [email or web address]  12/11/2022, 5:20 AM

## 2022-12-11 NOTE — ED Notes (Signed)
Difficult to obtain pulse ox waveform.  This RN able to obtain 96% briefly on monitor with good waveform.

## 2022-12-11 NOTE — Consult Note (Signed)
NAME:  [name], MRN:  [id number], DOB:  July 12, 1935, LOS: 0 ADMISSION DATE:  12/10/2022 CONSULTATION DATE:  12/11/2022 REFERRING MD:  Eloise Harman - EDP CHIEF COMPLAINT:  Dehydration, ?sepsis   History of Present Illness:  87 year old man who presented to [facility] ED 7/16 via EMS as a Level 2 Trauma after being found down at home. Concern for sepsis. PMHx significant for HTN, HLD, nonobstructive CAD, AF (on Eliquis), NICM s/p BiV ICD placement, COPD/tobacco use, BPH.  History is primarily obtained from chart review, as patient is profoundly HOH. Patient arrived to [facility] ED via EMS after being found down after unwitnessed fall at home 2 days PTA. Patient was reportedly unable to get up on his own and had not been seen for two days. On EMS arrival, SpO2 85% on RA, placed on NRB then weaned to [facility]. NS given by EMS.   On ED arrival, patient was febrile to Tmax 100.44F, HR 54, BP 115/90, SpO2 100% on [place]. WBC 9.7, Hgb 141, Plt 179. INR 1.7 ?on Eliquis. Na 156, K 4.6, CO2 21, Cr 2.15 (baseline 1.07 02/2022). Mild transaminitis with AST 327/ALT 173. CK 333. BNP 1400, Trop 103 > 85. Dig level undetectable. LA 4.1 > 3.5. COVID/Flu/RSV negative. CXR unremarkable; CT Head/Neck NAICA, ol L occipatl infarct/chronic ischemic microangiopathy. CTA Chest with peripheral lower lobe pulmonary emboli without R heart strain and RUL airspace disease. CT A/P mentions heterogenous striated nephrograms bilaterally, less likely pyelonephritis as UA clear. Broad-spectrum antibiotics initiated in ED.  PCCM consulted for ICU evaluation.  Pertinent Medical History:  HTN, HLD, nonobstructive CAD, AF (on Eliquis), NICM s/p BiV ICD placement, COPD/tobacco use, BPH  Significant Hospital Events: Including procedures, antibiotic start and stop dates in addition to other pertinent events   7/16 - Presented to [facility] ED as a Level 2 Trauma. 7/17 - PCCM consulted for ICU evaluation.  Interim History / Subjective:  PCCM consulted for  ICU evaluation.  Objective:  Blood pressure (!) 115/90, pulse (!) 54, temperature 98.8 F (37.1 C), temperature source Oral, resp. rate (!) 26, height 6\' 3"  (1.905 m), weight 77.1 kg, SpO2 100%.       No intake or output data in the 24 hours ending 12/11/22 0038 Filed Weights   12/10/22 1942  Weight: 77.1 kg   Physical Examination: General: Acute-on-chronically ill-appearing elderly man in NAD. Hard of hearing. HEENT: [place]/AT, +conjunctival injection bilaterally, L > R with purulent exudates/drainage, PERRL, dry mucous membranes. Neuro:  Awake, mildly confused; oriented to self. Very HOH.  Responds to verbal stimuli. Following commands consistently. Moves all 4 extremities spontaneously.  CV: Irregularly irregular rhythm, no m/g/r. PULM: Breathing even and unlabored on RA. Lung fields diminished throughout, R > L. GI: Soft, nontender, nondistended. Normoactive bowel sounds. Extremities: No significant LE edema noted. Skin: Warm/dry, no rashes. Various stage 1 pressure-related wound to lower chest, back/sacrum, POA.  Resolved Hospital Problem List:    Assessment & Plan:  Sepsis, presumed 2/2 PNA, less likely pyelo Lactic acidosis RUL PNA versus ?pyelonephritis - UA clean, more likely r/t severe dehydration/urinary retention - Goal MAP > 65 - Fluid resuscitation as tolerated, give additional 1-2L LR + maintenance fluids - Not requiring pressors at present - Trend WBC, fever curve, LA; LA improving with fluid resuscitation - F/u Cx data - Continue broad-spectrum antibiotics (cefepime, vanc, Flagyl)  RUL PNA History of COPD - Supplemental O2 support as needed to maintain SpO2 > 90% - Bronchodilators PRN - Pulmonary hygiene - Intermittent CXR -  Continue antibiotics  Permanent Afib, with RVR Peripheral lower lobe pulmonary emboli CHA2DS2-VASc Score at least 6. Variable compliance with Eliquis. - Continue amiodarone - Heparin gtt for AC, hold home Eliquis for now - Cardiac  monitoring - Optimize electrolytes for K > 4, Mg > 2 - F/u Echo  NICM with BiV ICD present Nonobstructive CAD HTN HLD Echo 02/2022 with EF 40-45%, followed by [facility]. Home meds include: Entresto, Toprol-XL, digoxin, Lipitor, Lasix. - Trend BNP, trop (103 > 85) - Resume GDMT as tolerated - Digoxin level undetectable on admission  Acute kidney injury, presume prerenal in the setting of severe dehydration Hypernatremia Cr 2.15, baseline 1.07 (02/2022); CK mildly elevated 333, less likely rhabdo. - Trend BMP - Replete electrolytes as indicated, fluid resuscitation for hyperNa - Monitor I&Os - F/u urine studies - Avoid nephrotoxic agents as able - Ensure adequate renal perfusion  Transaminitis - Trend LFTs  BPH Urinary retention - Bladder scans PRN  Bilateral conjunctivitis - Polytrim gtts x 5 days  Patient does not have ICU-specific needs at this time, needs significant fluid resuscitation and resumption of home medications as appropriate. Ok to admit to progressive care/stepdown.  Best Practice: (right click and "Reselect all SmartList Selections" daily)   Diet/type: NPO DVT prophylaxis: SCDs GI prophylaxis: N/A Lines: N/A Foley:  Yes, and it is still needed Code Status:  full code Last date of multidisciplinary goals of care discussion [Pending]  Labs:  CBC: Recent Labs  Lab 12/10/22 1928 12/10/22 1938  WBC 9.7  --   HGB 14.1 14.3  14.3  HCT 44.2 42.0  42.0  MCV 98.7  --   PLT 179  --    Basic Metabolic Panel: Recent Labs  Lab 12/10/22 1928 12/10/22 1938  NA 156* 155*  155*  K 4.6 5.0  5.0  CL 118* 119*  CO2 21*  --   GLUCOSE 134* 132*  BUN 44* 57*  CREATININE 2.15* 2.10*  CALCIUM 8.8*  --   MG 2.2  --    GFR: Estimated Creatinine Clearance: 28.6 mL/min (A) (by C-G formula based on SCr of 2.1 mg/dL (H)). Recent Labs  Lab 12/10/22 1928 12/10/22 2215  WBC 9.7  --   LATICACIDVEN  --  4.1*   Liver Function Tests: Recent Labs   Lab 12/10/22 1928  AST 327*  ALT 173*  ALKPHOS 109  BILITOT 3.9*  PROT 6.2*  ALBUMIN 3.0*   No results for input(s): "LIPASE", "AMYLASE" in the last 168 hours. No results for input(s): "AMMONIA" in the last 168 hours.  ABG:    Component Value Date/Time   HCO3 28.1 (H) 12/10/2022 1938   TCO2 29 12/10/2022 1938   TCO2 24 12/10/2022 1938   O2SAT 73 12/10/2022 1938    Coagulation Profile: Recent Labs  Lab 12/10/22 1928  INR 1.7*   Cardiac Enzymes: Recent Labs  Lab 12/10/22 1928  CKTOTAL 333   HbA1C: Hgb A1c MFr Bld  Date/Time Value Ref Range Status  04/20/2010 04:36 AM (H) <5.7 % Final   6.1 (NOTE)                                                                       According to the ADA Clinical Practice Recommendations  for 2011, when HbA1c is used as a screening test:   >=6.5%   Diagnostic of Diabetes Mellitus           (if abnormal result  is confirmed)  5.7-6.4%   Increased risk of developing Diabetes Mellitus  References:Diagnosis and Classification of Diabetes Mellitus,Diabetes Care,2011,34(Suppl 1):S62-S69 and Standards of Medical Care in         Diabetes - 2011,Diabetes Care,2011,34  (Suppl 1):S11-S61.  09/26/2009 05:50 AM (H) <5.7 % Final   6.2 (NOTE)                                                                       According to the ADA Clinical Practice Recommendations for 2011, when HbA1c is used as a screening test:   >=6.5%   Diagnostic of Diabetes Mellitus           (if abnormal result  is confirmed)  5.7-6.4%   Increased risk of developing Diabetes Mellitus  References:Diagnosis and Classification of Diabetes Mellitus,Diabetes Care,2011,34(Suppl 1):S62-S69 and Standards of Medical Care in         Diabetes - 2011,Diabetes Care,2011,34  (Suppl 1):S11-S61.   CBG: No results for input(s): "GLUCAP" in the last 168 hours.  Review of Systems:   Review of systems completed with pertinent positives/negatives outlined in above HPI.  Past Medical History:   He,  has no past medical history on file.   Surgical History:  History reviewed. No pertinent surgical history.   Social History:   reports that he has never smoked. He has never used smokeless tobacco.   Family History:  His family history is not on file.   Allergies: Allergies  Allergen Reactions   Lisinopril    Prednisone     psychosis   Glucosamine Rash   Home Medications: Prior to Admission medications   Medication Sig Start Date End Date Taking? Authorizing Provider  apixaban (ELIQUIS) 5 MG TABS tablet Take 5 mg by mouth 2 (two) times daily. 03/10/17   [provider]  atorvastatin (LIPITOR) 80 MG tablet Take 40 mg by mouth daily. 03/20/18   [provider]  b complex vitamins tablet Take 1 tablet by mouth daily.    [provider]  Cyanocobalamin 1000 MCG CAPS Take 1 tablet by mouth daily.    [provider]  digoxin (LANOXIN) 0.125 MG tablet Take 0.125 mg by mouth daily. 02/13/18   [provider]  digoxin (LANOXIN) 0.125 MG tablet Take 0.125 mg by mouth. 01/27/18   [provider]  furosemide (LASIX) 20 MG tablet Take 20 mg by mouth daily. 02/16/18   [provider]  gentamicin cream (GARAMYCIN) 0.1 % Apply 1 application topically 2 (two) times daily. 04/22/18   Felecia Shelling, DPM  meloxicam (MOBIC) 15 MG tablet Take 15 mg by mouth daily. 10/13/17   [provider]  metoprolol succinate (TOPROL-XL) 100 MG 24 hr tablet Take 100 mg by mouth daily. 10/13/17   [provider]  sacubitril-valsartan (ENTRESTO) 24-26 MG Take 1 tablet by mouth 2 (two) times daily. 03/26/18   [provider]  S[place] 1000 MG CAPS Take 1,000 mg by mouth daily.    [provider]  sildenafil (VIAGRA) 100 MG tablet Take by  mouth. 10/14/17   [provider]  tadalafil (ADCIRCA/CIALIS) 20 MG tablet Take 20 mg by mouth daily as needed for erectile dysfunction.    [provider]   tamsulosin (FLOMAX) 0.4 MG CAPS capsule Take 0.4 mg by mouth daily. 01/16/18   [provider]  vitamin C (ASCORBIC ACID) 500 MG tablet Take 500 mg by mouth daily.    [provider]  vitamin E 100 UNIT capsule Take 100 Units by mouth daily.    [provider]   Signature:   Faythe Ghee [facility] 12/11/22 12:38 AM  Please see [email or web address] for pager details.  From 7A-7P if no response, please call [phone] After hours, please call ELink [phone]

## 2022-12-11 NOTE — Progress Notes (Signed)
Levophed started due to hypotension despite IV fluid boluses.  Goal is to maintain MAP greater than 65.  Currently on IV fluid hydration in the setting of HFrEF.  Closely monitor volume status while on IV fluid.

## 2022-12-11 NOTE — ED Notes (Signed)
Pt transferred to ED 10 and received for care at this time.  Pt currently in soft restraints due to pulling at lines.  Bed in lowest position, wheels locked.  Call bell within reach.

## 2022-12-11 NOTE — Consult Note (Addendum)
Cardiology Consultation   Patient ID: [name] MRN: [id number]; DOB: 10-18-1935  Admit date: 12/10/2022 Date of Consult: 12/11/2022  PCP:  Scott Inch, MD   [facility] Providers Cardiologist:  [name] Dr [name] and Dr [name]   Patient Profile:   [name] is a 87 y.o. male with a hx of non-ischemic cardiomyopathy,  chronic systolic and diastolic heart failure, s/p biventricular ICD implant 2014, HTN, HLD, permanent A fib on Eliquis, COPD, remote tobacco use, who is being seen 12/11/2022 for the evaluation of A fib RVR at the request of Dr [name].  History of Present Illness:   [name] with above past medical history presented to the ER today after found down at home by his neighbors.  Patient was found hypoxic by EMS with pulse ox 85% on room air. He is not able to provide history due to confusion. He is able to answer to his name. He does not know why he is here in the hospital. He denied having pain or complaints. He knows his daughter is [name]. He talks inappropriately during encounter.   At ED, he was febrile with temperature 100.7, tachycardic, tachypneic. Admission diagnostic with VBG 7.4.  Initial CMP with hypernatremia 156, hyperchloremia at 118, bicarb 21, BUN elevated 44, creatinine elevated 2.15, anion gap 17, elevated AST at 327, ALT 173, total bilirubin 3.9, EGFR 30.  Magnesium 2.2.  BNP elevated 1428.  Total CK 333.  High sensitive troponin 103 >85.  Lactic acid 4.1 >3.5.  CBC grossly unremarkable.  INR 1.7.  Dig level <0.2.  Respiratory viral panel negative.  Urinalysis positive for hemoglobin and bacteria.  CT head and CT cervical spine showed no acute intracranial abnormality, old left occipital infarct and chronic ischemic microangiopathy, no acute fracture or static subluxation of cervical spine.  CTA chest/abdomen/pelvis noted peripheral lower lobe pulmonary emboli, no significant central pulmonary embolus, low clot burden unlikely right  heart strain.  Right upper lung airspace disease likely pneumonia.  Diffuse emphysematous change in the lungs, chronic bronchitic change.  Heterogenous striated nephrograms bilaterally likely indicate pyelonephritis, aortic atherosclerosis, prostate gland is enlarged.  He was admitted to hospital medicine for sepsis 2/2 pneumonia, acute metabolic encephalopathy, acute PE, acute hypoxic respiratory failure, AKI.  He was started on cefepime and vancomycin and Flagyl for broad-spectrum coverage.  Due to A-fib RVR, he was initiated on amiodarone drip, Eliquis was held while heparin infusion was given.  Echocardiogram completed today revealing LVEF 50 to 55%, not optimally assessed regional wall motion, unable to assess diastolic function, moderate reduced RV, severely dilated RA, trivial MR. Cardiology is consulted today for further evaluation of A-fib RVR.   Per chart review, patient follows [name] cardiology service, suffers nonischemic cardiomyopathy and chronic systolic and diastolic heart failure.  Reportedly his EF was 30 to 35% in 2014, improved to 50 to 55% in 2021, declined to 30 to 35% on 06/2020, and improved to 45 to 50% on 04/2021.  He underwent [name] biventricular ICD implantation for primary prevention in 2014, and underwent subsequent generator change 07/31/2020 .  He has permanent atrial fibrillation and historically maintained on Eliquis 5 mg twice daily. He was last seen 02/11/22 by his cardiologist, was doing well without cardiac symptoms.  He was advised to continue digoxin half tablet of 125 mcg daily, Lasix 20 mg daily, Entresto 24/26 mg twice daily, metoprolol XL 100 mg daily for GDMT.  Most recent echocardiogram was completed 03/04/2022 at [facility], which revealed LVEF  40 to 45%, mild hypokinesis of LV, abnormal septal motion consistent with RV pacing/bundle branch block, trace AI, trace MR, trace TR, normal RVSP, mildly dilated LA 4.3 cm, mildly dilated RA, no significant change comparing to  May 14, 2021 Echo.     Past Medical History:  Diagnosis Date   CAD (coronary artery disease)    Nonobstructive   COPD (chronic obstructive pulmonary disease) (HCC)    HLD (hyperlipidemia)    HTN (hypertension)    NICM (nonischemic cardiomyopathy) (HCC)    S/p Scott.Bolton AICD placement   Permanent atrial fibrillation (HCC)     History reviewed. No pertinent surgical history.   Home Medications:  Prior to Admission medications   Medication Sig Start Date End Date Taking? Authorizing Provider  apixaban (ELIQUIS) 5 MG TABS tablet Take 5 mg by mouth 2 (two) times daily. 03/10/17  Yes [provider]  atorvastatin (LIPITOR) 80 MG tablet Take 40 mg by mouth daily. 03/20/18  Yes [provider]  digoxin (LANOXIN) 0.125 MG tablet Take 0.125 mg by mouth daily. 02/13/18  Yes [provider]  furosemide (LASIX) 20 MG tablet Take 20 mg by mouth daily. 02/16/18  Yes [provider]  metoprolol succinate (TOPROL-XL) 100 MG 24 hr tablet Take 100 mg by mouth daily. 10/13/17  Yes [provider]  sacubitril-valsartan (ENTRESTO) 24-26 MG Take 1 tablet by mouth 2 (two) times daily. 03/26/18  Yes [provider]  tamsulosin (FLOMAX) 0.4 MG CAPS capsule Take 0.4 mg by mouth daily. 01/16/18  Yes [provider]    Inpatient Medications: Scheduled Meds:  digoxin  0.125 mg Intravenous Daily   metoprolol tartrate  5 mg Intravenous Q6H   trimethoprim-polymyxin b  2 drop Both Eyes Q4H   Continuous Infusions:  amiodarone 30 mg/hr (12/11/22 1347)   [START ON 12/12/2022] ceFEPime (MAXIPIME) IV     dextrose 5 % and 0.45 % NaCl 75 mL/hr at 12/11/22 1601   metronidazole Stopped (12/11/22 1037)   [START ON 12/12/2022] vancomycin     PRN Meds: prochlorperazine  Allergies:    Allergies  Allergen Reactions   Prednisone Other (See Comments)    Psychosis    Zestril [Lisinopril]     Unknown reaction   Glucosamine Rash    Social History:    Social History   Socioeconomic History   Marital status: Single    Spouse name: Not on file   Number of children: Not on file   Years of education: Not on file   Highest education level: Not on file  Occupational History   Not on file  Tobacco Use   Smoking status: Never   Smokeless tobacco: Never  Substance and Sexual Activity   Alcohol use: Not on file   Drug use: Not on file   Sexual activity: Not on file  Other Topics Concern   Not on file  Social History Narrative   Not on file   Social Determinants of Health   Financial Resource Strain: Low Risk  (06/14/2021)   Received [facility]   Overall Financial Resource Strain (CARDIA)    Difficulty of Paying Living Expenses: Not hard at all  Food Insecurity: No Food Insecurity (06/14/2021)   Received from [facility]   Hunger Vital Sign    Worried About Running Out of Food in the Last Year: Never true    Ran Out of Food in the Last Year: Never true  Transportation Needs: No Transportation Needs (06/14/2021)   Received from [facility]   PRAPARE - Administrator, Civil Service (Medical): No    Lack of Transportation (Non-Medical): No  Physical Activity: Inactive (06/14/2021)   Received from [facility]   Exercise Vital Sign    Days of Exercise per Week: 0 days    Minutes of Exercise per Session: 0 min  Stress: Stress Concern Present (06/14/2021)   Received from [facility] - Occupational Stress Questionnaire    Feeling of Stress : To some extent  Social Connections: Unknown (09/24/2021)   Received from [facility]   Social Network    Social Network: Not on file  Intimate Partner Violence: Unknown (08/27/2021)   [facility] from [name]   HITS    Physically Hurt: Not on file    Insult or Talk Down To: Not on file    Threaten Physical Harm: Not on file    Scream or Curse: Not on file    Family History:   Father: History of heart disease, kidney  disease, diabetes  ROS:  Unable to complete as patient is encephalopathic  Physical Exam/Data:   Vitals:   12/11/22 1300 12/11/22 1330 12/11/22 1400 12/11/22 1531  BP: (!) 116/94 117/85 110/88 118/80  Pulse:      Resp: 18 17 13    Temp:      TempSrc:      SpO2:      Weight:      Height:        Intake/Output Summary (Last 24 hours) at 12/11/2022 1636 Last data filed at 12/11/2022 1037 Gross per 24 hour  Intake 3450 ml  Output 3100 ml  Net 350 ml      12/10/2022    7:42 PM 09/21/2009    1:58 PM 08/24/2009    1:44 PM  Last 3 Weights  Weight (lbs) 170 lb 178 lb 180 lb  Weight (kg) 77.111 kg 80.74 kg 81.647 kg     Body mass index is 21.25 kg/m.   Vitals:  Vitals:   12/11/22 1400 12/11/22 1531  BP: 110/88 118/80  Pulse:    Resp: 13   Temp:    SpO2:     General Appearance: In no apparent distress, laying in bed, thin  HEENT: Normocephalic, atraumatic. Neck: Supple, trachea midline, no JVDs Cardiovascular: Irregularly irregular,  normal S1-S2,  no murmur Respiratory: Resting breathing unlabored, lungs sounds diminished bilaterally, no use of accessory muscles. On room air.   Gastrointestinal: Bowel sounds positive, abdomen soft, non-tender, non-distended.  Extremities: Able to move all extremities in bed without difficulty, 1- edema of BLE  Musculoskeletal: generalized muscular atrophy  Skin: Intact, warm, dry. Scattered petechiae noted in arms  Neurologic: Confused, making inappropriate speech, follow one step commands  Psychiatric: Calm    EKG:  The EKG was personally reviewed and demonstrates:    EKG from 12/11/2022 at 1420 revealed A-fib RVR 130 bpm, incomplete RBBB, LAFB  EKG from 12/10/2022 at 2227 revealed A-fib RVR at 141, old RBBB, LAFB  Telemetry:  Telemetry was personally reviewed and demonstrates:    A fib RVR 90-130s    Relevant CV Studies:   Echo 12/11/22:  1. Left ventricular ejection fraction, by estimation, is 50 to 55%. The  left ventricle  has low normal function. Left ventricular endocardial  border not optimally defined to evaluate regional wall motion. Left  ventricular diastolic function could not be  evaluated.   2. Right ventricular systolic function is moderately reduced. The right  ventricular size is normal.   3. Right atrial size was severely dilated.   4. The mitral valve is normal in structure. Trivial mitral valve  regurgitation. No evidence of mitral stenosis.   5. The aortic valve is normal in structure. Aortic valve regurgitation is  not visualized. No aortic stenosis is present.   6. The inferior vena cava is dilated in size with <50% respiratory  variability, suggesting right atrial pressure of 15 mmHg.   Conclusion(s)/Recommendation(s): Technically very limited study with poor  sound wave transmission. LV never seen well but EF appears preserved on  apica images. RV is moderately hypokientic. There is a pcing wire evident  in the coronary sinus.    Laboratory Data:  High Sensitivity Troponin:   Recent Labs  Lab 12/10/22 1928 12/10/22 2215  TROPONINIHS 103* 85*     Chemistry Recent Labs  Lab 12/10/22 1928 12/10/22 1938 12/11/22 0344 12/11/22 0823 12/11/22 0830  NA 156* 155*  155* 150* 152* 152*  K 4.6 5.0  5.0 4.1 4.3 4.2  CL 118* 119* 114* 118*  --   CO2 21*  --  22 21*  --   GLUCOSE 134* 132* 167* 165*  --   BUN 44* 57* 44* 44*  --   CREATININE 2.15* 2.10* 1.85* 2.04*  --   CALCIUM 8.8*  --  8.6* 8.6*  --   MG 2.2  --   --   --   --   GFRNONAA 30*  --  35* 32*  --   ANIONGAP 17*  --  14 13  --     Recent Labs  Lab 12/10/22 1928 12/11/22 0344 12/11/22 0823  PROT 6.2* 5.9* 6.1*  ALBUMIN 3.0* 2.8* 2.9*  AST 327* 333* 376*  ALT 173* 196* 231*  ALKPHOS 109 120 121  BILITOT 3.9* 3.3* 3.1*   Lipids No results for input(s): "CHOL", "TRIG", "HDL", "LABVLDL", "LDLCALC", "CHOLHDL" in the last 168 hours.  Hematology Recent Labs  Lab 12/10/22 1928 12/10/22 1938 12/11/22 0344  12/11/22 0823 12/11/22 0830  WBC 9.7  --  11.3* 11.7*  --   RBC 4.48  --  4.53 4.68  --   HGB 14.1   < > 14.4 14.5 15.3  HCT 44.2   < > 46.5 47.8 45.0  MCV 98.7  --  102.6* 102.1*  --   MCH 31.5  --  31.8 31.0  --   MCHC 31.9  --  31.0 30.3  --   RDW 16.1*  --  16.3* 16.2*  --   PLT 179  --  145* 155  --    < > = values in this interval not displayed.   Thyroid No results for input(s): "TSH", "FREET4" in the last 168 hours.  BNP Recent Labs  Lab 12/10/22 1928  BNP 1,420.9*    DDimer No results for input(s): "DDIMER" in the last 168 hours.   Radiology/Studies:  ECHOCARDIOGRAM COMPLETE  Result Date: 12/11/2022    ECHOCARDIOGRAM REPORT   Patient Name:   [name] Date of Exam: 12/11/2022 Medical Rec #:  [id number]        Height:       75.0 in Accession #:    [id number]       Weight:       170.0 lb Date of Birth:  Dec 06, 1935        BSA:          2.048 m Patient Age:    56 years  BP:           101/83 mmHg Patient Gender: M                HR:           104 bpm. Exam Location:  Inpatient Procedure: 2D Echo, Color Doppler and Cardiac Doppler Indications:    Elevated troponin  History:        Patient has prior history of Echocardiogram examinations, most                 recent 03/04/2022. Cardiomyopathy and HFrEF, CAD, Defibrillator,                 COPD, Arrythmias:Atrial Fibrillation, Signs/Symptoms:Altered                 Mental Status; Risk Factors:Hypertension and Dyslipidemia.  Sonographer:    Milda Smart Referring Phys: [id number] CAROLE N HALL  Sonographer Comments: Image acquisition challenging due to patient body habitus, Image acquisition challenging due to uncooperative patient and Image acquisition challenging due to respiratory motion. Pt could not be optimally positioned. IMPRESSIONS  1. Left ventricular ejection fraction, by estimation, is 50 to 55%. The left ventricle has low normal function. Left ventricular endocardial border not optimally defined to evaluate regional  wall motion. Left ventricular diastolic function could not be evaluated.  2. Right ventricular systolic function is moderately reduced. The right ventricular size is normal.  3. Right atrial size was severely dilated.  4. The mitral valve is normal in structure. Trivial mitral valve regurgitation. No evidence of mitral stenosis.  5. The aortic valve is normal in structure. Aortic valve regurgitation is not visualized. No aortic stenosis is present.  6. The inferior vena cava is dilated in size with <50% respiratory variability, suggesting right atrial pressure of 15 mmHg. Conclusion(s)/Recommendation(s): Technically very limited study with poor sound wave transmission. LV never seen well but EF appears preserved on apica images. RV is moderately hypokientic. There is a pcing wire evident in the coronary sinus. FINDINGS  Left Ventricle: Left ventricular ejection fraction, by estimation, is 50 to 55%. The left ventricle has low normal function. Left ventricular endocardial border not optimally defined to evaluate regional wall motion. The left ventricular internal cavity  size was normal in size. There is no left ventricular hypertrophy. Left ventricular diastolic function could not be evaluated due to atrial fibrillation. Left ventricular diastolic function could not be evaluated. Right Ventricle: The right ventricular size is normal. No increase in right ventricular wall thickness. Right ventricular systolic function is moderately reduced. Left Atrium: Left atrial size was normal in size. Right Atrium: Right atrial size was severely dilated. Pericardium: There is no evidence of pericardial effusion. Mitral Valve: The mitral valve is normal in structure. Mild mitral annular calcification. Trivial mitral valve regurgitation. No evidence of mitral valve stenosis. Tricuspid Valve: The tricuspid valve is not well visualized. Tricuspid valve regurgitation is mild . No evidence of tricuspid stenosis. Aortic Valve: The  aortic valve is normal in structure. Aortic valve regurgitation is not visualized. No aortic stenosis is present. Pulmonic Valve: The pulmonic valve was normal in structure. Pulmonic valve regurgitation is not visualized. No evidence of pulmonic stenosis. Aorta: The aortic root is normal in size and structure. Venous: The inferior vena cava is dilated in size with less than 50% respiratory variability, suggesting right atrial pressure of 15 mmHg. IAS/Shunts: No atrial level shunt detected by color flow Doppler. Additional Comments: A device lead is visualized.  LEFT ATRIUM  Index        RIGHT ATRIUM           Index LA Vol (A4C): 52.5 ml 25.64 ml/m  RA Area:     39.40 cm                                    RA Volume:   163.00 ml 79.59 ml/m Arvilla Meres MD Electronically signed by Arvilla Meres MD Signature Date/Time: 12/11/2022/1:49:21 PM    Final    DG CHEST PORT 1 VIEW  Result Date: 12/11/2022 CLINICAL DATA:  Unwitnessed fall. EXAM: PORTABLE CHEST 1 VIEW COMPARISON:  12/10/2022 FINDINGS: The lungs are clear without focal pneumonia, edema, pneumothorax or pleural effusion. Cardiopericardial silhouette is at upper limits of normal for size. Left pacer/AICD again noted. No acute bony abnormality. Telemetry leads overlie the chest. IMPRESSION: No active disease. Electronically Signed   By: Kennith Center M.D.   On: 12/11/2022 07:35   CT Angio Chest PE W and/or Wo Contrast  Result Date: 12/10/2022 CLINICAL DATA:  Pulmonary embolus suspected with high probability. Fall. Sepsis. EXAM: CT ANGIOGRAPHY CHEST CT ABDOMEN AND PELVIS WITH CONTRAST TECHNIQUE: Multidetector CT imaging of the chest was performed using the standard protocol during bolus administration of intravenous contrast. Multiplanar CT image reconstructions and MIPs were obtained to evaluate the vascular anatomy. Multidetector CT imaging of the abdomen and pelvis was performed using the standard protocol during bolus administration of  intravenous contrast. RADIATION DOSE REDUCTION: This exam was performed according to the departmental dose-optimization program which includes automated exposure control, adjustment of the mA and/or kV according to patient size and/or use of iterative reconstruction technique. CONTRAST:  75mL OMNIPAQUE IOHEXOL 350 MG/ML SOLN COMPARISON:  CT chest 07/25/2009 FINDINGS: CTA CHEST FINDINGS Cardiovascular: Good visualization of the central and segmental pulmonary arteries. Filling defects are demonstrated in bilateral lower lobe subsegmental pulmonary arteries suggesting peripheral pulmonary embolus. No large central pulmonary emboli. Clot burden is very low. No contrast material is demonstrated in the left ventricle limiting evaluation of the RV to LV ratio. The cardiac chambers appear grossly symmetrical. Due to low clot burden, right heart strain is unlikely. There is diffuse cardiac enlargement with reflux of contrast material into the hepatic veins suggesting right heart failure. Normal caliber thoracic aorta. Calcification of the aorta and coronary arteries. Mediastinum/Nodes: No enlarged mediastinal, hilar, or axillary lymph nodes. Thyroid gland, trachea, and esophagus demonstrate no significant findings. Venous gas is present likely resulting from intravenous injections. Cardiac pacemaker. Lungs/Pleura: Diffuse emphysematous changes in the lungs. Bronchial wall thickening consistent with chronic bronchitis. Patchy airspace infiltrates in the right upper lung likely representing pneumonia. Aspiration could also have this appearance. No pleural effusions. No pneumothorax. Musculoskeletal: Degenerative changes in the spine and shoulders. Review of the MIP images confirms the above findings. CT ABDOMEN and PELVIS FINDINGS Hepatobiliary: No focal liver abnormality is seen. Status post cholecystectomy. No biliary dilatation. Pancreas: Unremarkable. No pancreatic ductal dilatation or surrounding inflammatory changes.  Spleen: Normal in size without focal abnormality. Adrenals/Urinary Tract: No adrenal gland nodules. Nephrograms are diffusely heterogeneous and striated bilaterally, likely indicating pyelonephritis. No hydronephrosis or hydroureter. Bladder is decompressed with a Foley catheter in place. Stomach/Bowel: Stomach is within normal limits. Appendix is not identified. No evidence of bowel wall thickening, distention, or inflammatory changes. Vascular/Lymphatic: Diffuse aortic atherosclerosis with diffuse calcification. No aneurysm or dissection. Reproductive: Prostate gland is mildly enlarged. Other: No free air  or free fluid in the abdomen. Abdominal wall musculature appears intact. Musculoskeletal: Degenerative changes in the spine. Review of the MIP images confirms the above findings. IMPRESSION: 1. Positive examination for peripheral lower lobe pulmonary emboli. No significant central pulmonary embolus. Low clot burden indicates right heart strain would be unlikely. 2. Airspace disease in the right upper lung is likely pneumonia. 3. Diffuse emphysematous changes in the lungs. Chronic bronchitic changes. 4. Aortic atherosclerosis. 5. Heterogeneous striated nephrograms bilaterally likely indicate pyelonephritis. 6. Prostate gland is enlarged. Critical Value/emergent results were called by telephone at the time of interpretation on 12/10/2022 at 9:43 pm to provider Vonita Moss , who verbally acknowledged these results. Electronically Signed   By: Burman Nieves M.D.   On: 12/10/2022 21:52   CT ABDOMEN PELVIS W CONTRAST  Result Date: 12/10/2022 CLINICAL DATA:  Pulmonary embolus suspected with high probability. Fall. Sepsis. EXAM: CT ANGIOGRAPHY CHEST CT ABDOMEN AND PELVIS WITH CONTRAST TECHNIQUE: Multidetector CT imaging of the chest was performed using the standard protocol during bolus administration of intravenous contrast. Multiplanar CT image reconstructions and MIPs were obtained to evaluate the vascular  anatomy. Multidetector CT imaging of the abdomen and pelvis was performed using the standard protocol during bolus administration of intravenous contrast. RADIATION DOSE REDUCTION: This exam was performed according to the departmental dose-optimization program which includes automated exposure control, adjustment of the mA and/or kV according to patient size and/or use of iterative reconstruction technique. CONTRAST:  75mL OMNIPAQUE IOHEXOL 350 MG/ML SOLN COMPARISON:  CT chest 07/25/2009 FINDINGS: CTA CHEST FINDINGS Cardiovascular: Good visualization of the central and segmental pulmonary arteries. Filling defects are demonstrated in bilateral lower lobe subsegmental pulmonary arteries suggesting peripheral pulmonary embolus. No large central pulmonary emboli. Clot burden is very low. No contrast material is demonstrated in the left ventricle limiting evaluation of the RV to LV ratio. The cardiac chambers appear grossly symmetrical. Due to low clot burden, right heart strain is unlikely. There is diffuse cardiac enlargement with reflux of contrast material into the hepatic veins suggesting right heart failure. Normal caliber thoracic aorta. Calcification of the aorta and coronary arteries. Mediastinum/Nodes: No enlarged mediastinal, hilar, or axillary lymph nodes. Thyroid gland, trachea, and esophagus demonstrate no significant findings. Venous gas is present likely resulting from intravenous injections. Cardiac pacemaker. Lungs/Pleura: Diffuse emphysematous changes in the lungs. Bronchial wall thickening consistent with chronic bronchitis. Patchy airspace infiltrates in the right upper lung likely representing pneumonia. Aspiration could also have this appearance. No pleural effusions. No pneumothorax. Musculoskeletal: Degenerative changes in the spine and shoulders. Review of the MIP images confirms the above findings. CT ABDOMEN and PELVIS FINDINGS Hepatobiliary: No focal liver abnormality is seen. Status post  cholecystectomy. No biliary dilatation. Pancreas: Unremarkable. No pancreatic ductal dilatation or surrounding inflammatory changes. Spleen: Normal in size without focal abnormality. Adrenals/Urinary Tract: No adrenal gland nodules. Nephrograms are diffusely heterogeneous and striated bilaterally, likely indicating pyelonephritis. No hydronephrosis or hydroureter. Bladder is decompressed with a Foley catheter in place. Stomach/Bowel: Stomach is within normal limits. Appendix is not identified. No evidence of bowel wall thickening, distention, or inflammatory changes. Vascular/Lymphatic: Diffuse aortic atherosclerosis with diffuse calcification. No aneurysm or dissection. Reproductive: Prostate gland is mildly enlarged. Other: No free air or free fluid in the abdomen. Abdominal wall musculature appears intact. Musculoskeletal: Degenerative changes in the spine. Review of the MIP images confirms the above findings. IMPRESSION: 1. Positive examination for peripheral lower lobe pulmonary emboli. No significant central pulmonary embolus. Low clot burden indicates right heart strain would be  unlikely. 2. Airspace disease in the right upper lung is likely pneumonia. 3. Diffuse emphysematous changes in the lungs. Chronic bronchitic changes. 4. Aortic atherosclerosis. 5. Heterogeneous striated nephrograms bilaterally likely indicate pyelonephritis. 6. Prostate gland is enlarged. Critical Value/emergent results were called by telephone at the time of interpretation on 12/10/2022 at 9:43 pm to provider Vonita Moss , who verbally acknowledged these results. Electronically Signed   By: Burman Nieves M.D.   On: 12/10/2022 21:52   CT Head Wo Contrast  Result Date: 12/10/2022 CLINICAL DATA:  Fall EXAM: CT HEAD WITHOUT CONTRAST CT CERVICAL SPINE WITHOUT CONTRAST TECHNIQUE: Multidetector CT imaging of the head and cervical spine was performed following the standard protocol without intravenous contrast. Multiplanar CT image  reconstructions of the cervical spine were also generated. RADIATION DOSE REDUCTION: This exam was performed according to the departmental dose-optimization program which includes automated exposure control, adjustment of the mA and/or kV according to patient size and/or use of iterative reconstruction technique. COMPARISON:  None Available. FINDINGS: CT HEAD FINDINGS Brain: There is no mass, hemorrhage or extra-axial collection. There is generalized atrophy without lobar predilection. There is hypoattenuation of the periventricular white matter, most commonly indicating chronic ischemic microangiopathy. Old left occipital infarct. Vascular: Atherosclerotic calcification of the vertebral and internal carotid arteries at the skull base. No abnormal hyperdensity of the major intracranial arteries or dural venous sinuses. Skull: The visualized skull base, calvarium and extracranial soft tissues are normal. Sinuses/Orbits: No fluid levels or advanced mucosal thickening of the visualized paranasal sinuses. No mastoid or middle ear effusion. The orbits are normal. CT CERVICAL SPINE FINDINGS Alignment: Reversal of normal cervical lordosis. Grade 1 anterolisthesis at C3-4 and grade 1 retrolisthesis at C4-5 and C5-6. Skull base and vertebrae: No acute fracture. Soft tissues and spinal canal: No prevertebral fluid or swelling. No visible canal hematoma. Disc levels: No advanced spinal canal or neural foraminal stenosis. Upper chest: Severe facet arthrosis at C2-3 and C3-4. No high-grade spinal canal stenosis. Other: Normal visualized paraspinal cervical soft tissues. IMPRESSION: 1. No acute intracranial abnormality. 2. 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 3. No acute fracture or static subluxation of the cervical spine. Electronically Signed   By: Deatra Robinson M.D.   On: 12/10/2022 21:33   CT Cervical Spine Wo Contrast  Result Date: 12/10/2022 CLINICAL DATA:  Fall EXAM: CT HEAD WITHOUT  CONTRAST CT CERVICAL SPINE WITHOUT CONTRAST TECHNIQUE: Multidetector CT imaging of the head and cervical spine was performed following the standard protocol without intravenous contrast. Multiplanar CT image reconstructions of the cervical spine were also generated. RADIATION DOSE REDUCTION: This exam was performed according to the departmental dose-optimization program which includes automated exposure control, adjustment of the mA and/or kV according to patient size and/or use of iterative reconstruction technique. COMPARISON:  None Available. FINDINGS: CT HEAD FINDINGS Brain: There is no mass, hemorrhage or extra-axial collection. There is generalized atrophy without lobar predilection. There is hypoattenuation of the periventricular white matter, most commonly indicating chronic ischemic microangiopathy. Old left occipital infarct. Vascular: Atherosclerotic calcification of the vertebral and internal carotid arteries at the skull base. No abnormal hyperdensity of the major intracranial arteries or dural venous sinuses. Skull: The visualized skull base, calvarium and extracranial soft tissues are normal. Sinuses/Orbits: No fluid levels or advanced mucosal thickening of the visualized paranasal sinuses. No mastoid or middle ear effusion. The orbits are normal. CT CERVICAL SPINE FINDINGS Alignment: Reversal of normal cervical lordosis. Grade 1 anterolisthesis at C3-4 and grade 1 retrolisthesis at C4-5  and C5-6. Skull base and vertebrae: No acute fracture. Soft tissues and spinal canal: No prevertebral fluid or swelling. No visible canal hematoma. Disc levels: No advanced spinal canal or neural foraminal stenosis. Upper chest: Severe facet arthrosis at C2-3 and C3-4. No high-grade spinal canal stenosis. Other: Normal visualized paraspinal cervical soft tissues. IMPRESSION: 1. No acute intracranial abnormality. 2. 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 3. No acute fracture or static  subluxation of the cervical spine. Electronically Signed   By: Deatra Robinson M.D.   On: 12/10/2022 21:33   DG Chest Portable 1 View  Result Date: 12/10/2022 CLINICAL DATA:  Altered mental status EXAM: PORTABLE CHEST 1 VIEW COMPARISON:  05/03/2010 FINDINGS: Interim placement of left-sided pacing device. Borderline cardiac size. Aortic atherosclerosis. No acute airspace disease, pleural effusion or pneumothorax. IMPRESSION: No active disease. Electronically Signed   By: Jasmine Pang M.D.   On: 12/10/2022 20:44     Assessment and Plan:   Permanent A-fib with RVR -Longstanding history of permanent A-fib, historically maintained on metoprolol and Eliquis -Presents with sepsis/pneumonia/acute hypoxic respiratory failure/AKI/encephalopathy, likely mediating RVR - continue infectious treatment, keep Mag >2 and K >4, maintain adequate hydration  - temporary use of amiodarone for A fib RVR is Ok given hypotension, plan to discontinue once acute illness resolve given COPD   Acute PE - RV is down with suspected right heart strain on Echo today, reviewed with Dr Izora Ribas, query if hypotensive episode is due to PE, BP is now better  - on PTA eliquis, if he is complaint, he has failed eliquis, would switch to alternative DOAC versus coumadin   Chronic systolic and diastolic heart failure - BNP 1420 - CT without CHF - clinical picture is driving by sepsis/pneumonia, not CHF decompensation - monitor I&O, daily weight, diuresis if needed   Risk Assessment/Risk Scores:   CHA2DS2-VASc Score = 4  This indicates a 4.8% annual risk of stroke. The patient's score is based upon: CHF History: 1 HTN History: 1 Diabetes History: 0 Stroke History: 0 Vascular Disease History: 0 Age Score: 2 Gender Score: 0      For questions or updates, please contact [facility] Please consult [email or web address] for contact info under    Signed, Cyndi Bender, NP  12/11/2022 4:36 PM   Personally seen and  examined. Agree with APP above with the following comments:  Briefly [name] is an 87 yo M with a history of permanent AF, COPD and former tobacco, and HF recovered EF s/p BiV, who was found down.  Found to PE    Exam notable for  AOX 1 (name).  IRIR tachycardia.  No Tachypnea.  Warm extremities.    Labs notable for Hypernatremia, Creatinine 2.04 (continues to increase), low albumin, Transaminitis. BNP.1402.  Lactate 2.6. EKG Afib with RVR rates 131 Tele: AF RVR rates 110s Personally reviewed relevant tests; RV is dilated both on CT and Echo.    Would recommend - given the small PE, his RV dysfunction is likely related to permanent atrial fibrillation - continue digoxin for now but will follow closely (I doubt he has been taking his digoxin  level is <0.2).   I doubt this is related to his eliquis failure  - given hypotension, holding GDMT for now - reasonable IV amiodarone start short term - would not pursue PERT activitation at this time  Riley Lam, MD FASE [facility] Cardiologist [facility]  [address] [place], [address] [place], [place] [address] [phone]  5:42 PM
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
## 2022-12-12 ENCOUNTER — Inpatient Hospital Stay (HOSPITAL_COMMUNITY): Payer: Medicare HMO

## 2022-12-12 ENCOUNTER — Other Ambulatory Visit: Payer: Self-pay

## 2022-12-12 DIAGNOSIS — R4182 Altered mental status, unspecified: Secondary | ICD-10-CM | POA: Diagnosis not present

## 2022-12-12 DIAGNOSIS — Z515 Encounter for palliative care: Secondary | ICD-10-CM

## 2022-12-12 DIAGNOSIS — A419 Sepsis, unspecified organism: Secondary | ICD-10-CM | POA: Diagnosis not present

## 2022-12-12 DIAGNOSIS — G9340 Encephalopathy, unspecified: Secondary | ICD-10-CM | POA: Diagnosis not present

## 2022-12-12 DIAGNOSIS — I2694 Multiple subsegmental pulmonary emboli without acute cor pulmonale: Secondary | ICD-10-CM | POA: Diagnosis not present

## 2022-12-12 DIAGNOSIS — J189 Pneumonia, unspecified organism: Secondary | ICD-10-CM | POA: Diagnosis not present

## 2022-12-12 DIAGNOSIS — I4891 Unspecified atrial fibrillation: Secondary | ICD-10-CM | POA: Diagnosis not present

## 2022-12-12 DIAGNOSIS — Z7189 Other specified counseling: Secondary | ICD-10-CM

## 2022-12-12 LAB — BLOOD CULTURE ID PANEL (REFLEXED) - BCID2

## 2022-12-12 LAB — CBC
HCT: 43.7 % (ref 39.0–52.0)
Hemoglobin: 13.7 g/dL (ref 13.0–17.0)
MCH: 31.4 pg (ref 26.0–34.0)
MCHC: 31.4 g/dL (ref 30.0–36.0)
MCV: 100 fL (ref 80.0–100.0)
Platelets: 116 10*3/uL — ABNORMAL LOW (ref 150–400)
RBC: 4.37 MIL/uL (ref 4.22–5.81)
RDW: 15.9 % — ABNORMAL HIGH (ref 11.5–15.5)
WBC: 8.2 10*3/uL (ref 4.0–10.5)
nRBC: 0 % (ref 0.0–0.2)

## 2022-12-12 LAB — COMPREHENSIVE METABOLIC PANEL
ALT: 187 U/L — ABNORMAL HIGH (ref 0–44)
AST: 165 U/L — ABNORMAL HIGH (ref 15–41)
Albumin: 2.6 g/dL — ABNORMAL LOW (ref 3.5–5.0)
Alkaline Phosphatase: 99 U/L (ref 38–126)
Anion gap: 13 (ref 5–15)
BUN: 48 mg/dL — ABNORMAL HIGH (ref 8–23)
CO2: 23 mmol/L (ref 22–32)
Calcium: 8.4 mg/dL — ABNORMAL LOW (ref 8.9–10.3)
Chloride: 114 mmol/L — ABNORMAL HIGH (ref 98–111)
Creatinine, Ser: 1.92 mg/dL — ABNORMAL HIGH (ref 0.61–1.24)
GFR, Estimated: 34 mL/min — ABNORMAL LOW (ref >60)
Glucose, Bld: 163 mg/dL — ABNORMAL HIGH (ref 70–99)
Potassium: 3.7 mmol/L (ref 3.5–5.1)
Sodium: 150 mmol/L — ABNORMAL HIGH (ref 135–145)
Total Bilirubin: 2 mg/dL — ABNORMAL HIGH (ref 0.3–1.2)
Total Protein: 5.4 g/dL — ABNORMAL LOW (ref 6.5–8.1)

## 2022-12-12 LAB — RAPID URINE DRUG SCREEN, HOSP PERFORMED
Amphetamines: NOT DETECTED
Barbiturates: NOT DETECTED
Benzodiazepines: NOT DETECTED
Cocaine: NOT DETECTED
Opiates: POSITIVE — AB
Tetrahydrocannabinol: NOT DETECTED

## 2022-12-12 LAB — AMMONIA: Ammonia: 52 umol/L — ABNORMAL HIGH (ref 9–35)

## 2022-12-12 LAB — GLUCOSE, CAPILLARY
Glucose-Capillary: 107 mg/dL — ABNORMAL HIGH (ref 70–99)
Glucose-Capillary: 125 mg/dL — ABNORMAL HIGH (ref 70–99)
Glucose-Capillary: 125 mg/dL — ABNORMAL HIGH (ref 70–99)
Glucose-Capillary: 137 mg/dL — ABNORMAL HIGH (ref 70–99)
Glucose-Capillary: 162 mg/dL — ABNORMAL HIGH (ref 70–99)
Glucose-Capillary: 164 mg/dL — ABNORMAL HIGH (ref 70–99)

## 2022-12-12 LAB — C-REACTIVE PROTEIN: CRP: 7.2 mg/dL — ABNORMAL HIGH (ref <1.0)

## 2022-12-12 LAB — PROCALCITONIN: Procalcitonin: 0.18 ng/mL

## 2022-12-12 LAB — MAGNESIUM: Magnesium: 1.8 mg/dL (ref 1.7–2.4)

## 2022-12-12 LAB — CK: Total CK: 137 U/L (ref 49–397)

## 2022-12-12 LAB — BRAIN NATRIURETIC PEPTIDE: B Natriuretic Peptide: 911.9 pg/mL — ABNORMAL HIGH (ref 0.0–100.0)

## 2022-12-12 MED ORDER — LACTATED RINGERS IV BOLUS
1000.0000 mL | Freq: Once | INTRAVENOUS | Status: AC
  Administered 2022-12-12: 1000 mL via INTRAVENOUS

## 2022-12-12 MED ORDER — SODIUM CHLORIDE 0.9% FLUSH
10.0000 mL | Freq: Two times a day (BID) | INTRAVENOUS | Status: DC
  Administered 2022-12-12 – 2022-12-13 (×2): 10 mL

## 2022-12-12 MED ORDER — SODIUM CHLORIDE 0.9% FLUSH: 10.0000 mL | INTRAVENOUS | Status: DC | PRN

## 2022-12-12 MED ORDER — SODIUM CHLORIDE 0.9 % IV SOLN
3.0000 g | Freq: Two times a day (BID) | INTRAVENOUS | Status: DC
  Administered 2022-12-12 – 2022-12-13 (×3): 3 g via INTRAVENOUS
  Filled 2022-12-12 (×3): qty 8

## 2022-12-12 MED ORDER — HYALURONIDASE HUMAN 150 UNIT/ML IJ SOLN
150.0000 [IU] | Freq: Once | INTRAMUSCULAR | Status: AC
  Administered 2022-12-12: 150 [IU] via SUBCUTANEOUS
  Filled 2022-12-12: qty 1

## 2022-12-12 MED ORDER — MAGNESIUM SULFATE 2 GM/50ML IV SOLN
2.0000 g | Freq: Once | INTRAVENOUS | Status: AC
  Administered 2022-12-12: 2 g via INTRAVENOUS
  Filled 2022-12-12: qty 50

## 2022-12-12 MED ORDER — HYDROCERIN EX CREA
TOPICAL_CREAM | Freq: Every day | CUTANEOUS | Status: DC
  Filled 2022-12-12 (×2): qty 113

## 2022-12-12 MED ORDER — DEXTROSE 5 % IV SOLN: INTRAVENOUS | Status: DC

## 2022-12-12 MED ORDER — HEPARIN (PORCINE) 25000 UT/250ML-% IV SOLN
1300.0000 [IU]/h | INTRAVENOUS | Status: DC
  Administered 2022-12-12 – 2022-12-13 (×2): 1300 [IU]/h via INTRAVENOUS
  Filled 2022-12-12 (×2): qty 250

## 2022-12-12 MED ORDER — CHLORHEXIDINE GLUCONATE CLOTH 2 % EX PADS
6.0000 | MEDICATED_PAD | Freq: Every day | CUTANEOUS | Status: DC
  Administered 2022-12-12 – 2022-12-15 (×4): 6 via TOPICAL

## 2022-12-12 NOTE — Progress Notes (Signed)
Unable to obtain consent from POA or next of kin. Dr. Thedore Mins made aware and has given permission place the PICC line for medical necessity. [name]

## 2022-12-12 NOTE — Progress Notes (Addendum)
SLP Cancellation Note  Patient Details Name: [name] MRN: [id number] DOB: 03-22-1936   Cancelled treatment:        Checked with RN to see if pt medically stable to have MBS this afternoon. She states he is not stable to come to radiology at this time (HR high). SLP will reattempt tomorrow and will check with nursing in the morning.    Royce Macadamia 12/12/2022, 12:47 PM

## 2022-12-12 NOTE — Progress Notes (Signed)
OT Cancellation Note  Patient Details Name: [name] MRN: [id number] DOB: 08-23-1935   Cancelled Treatment:    Reason Eval/Treat Not Completed: Medical issues which prohibited therapy;Patient not medically ready. Pt no longer on anticoagulation due to +Pes; HR fluctuating and up to 120 at rest; RN agreeing pt is not yet appropriate. Will return as able and pt medically ready.   [name], OTR/L [facility] Acute Rehabilitation Office: [phone]   Myrla Halsted 12/12/2022, 10:38 AM

## 2022-12-12 NOTE — Progress Notes (Signed)
Temp was 93.8 earlier and now 94.2 after multiple warm blankets. Notified Dr. Imogene Burn and received order for bair hugger.

## 2022-12-12 NOTE — Progress Notes (Signed)
IVT consult placed, requesting additional IV due to multiple incompatible meds. Primary RN requesting assessment of IV in right AC that has amio infusing.  Site leaking, discolored, purple and edematous. PIV removed, pharmacy contacted with concern for infiltration.  Per pharmacist Windell Moulding) recommending antidote. Primary RN and MD aware.  Able to obtain 1 PIV, Given Pt's need for multiple incompatible meds, condition of left arm and limited availability on the right would recommend CVC access. Order placed by MD.

## 2022-12-12 NOTE — Progress Notes (Signed)
ANTICOAGULATION CONSULT NOTE   Pharmacy Consult for heparin Indication: atrial fibrillation and pulmonary embolus  Allergies  Allergen Reactions   Prednisone Other (See Comments)    Psychosis    Zestril [Lisinopril]     Unknown reaction   Glucosamine Rash    Patient Measurements: Height: 6\' 3"  (190.5 cm) Weight: 72.1 kg (158 lb 15.2 oz) IBW/kg (Calculated) : 84.5 Heparin Dosing Weight: TBW  Vital Signs: Temp: 98 F (36.7 C) (07/18 0923) Temp Source: Oral (07/18 0923) BP: 116/88 (07/18 0923) Pulse Rate: 120 (07/18 0923)  Labs: Recent Labs    12/07/2022 1928 11/25/2022 1938 12/14/2022 2215 11/27/2022 2331 12/11/22 0344 12/11/22 0823 12/11/22 0830 12/11/22 1918 12/12/22 0611  HGB 14.1   < >  --   --  14.4 14.5 15.3 15.5 13.7  HCT 44.2   < >  --   --  46.5 47.8 45.0 50.0 43.7  PLT 179  --   --   --  145* 155  --   --  116*  APTT 31  --   --  33  --   --   --   --   --   LABPROT 19.7*  --   --   --   --   --   --   --   --   INR 1.7*  --   --   --   --   --   --   --   --   HEPARINUNFRC  --   --   --  <0.10*  --  0.45  --   --   --   CREATININE 2.15*   < >  --   --  1.85* 2.04*  --   --  1.92*  CKTOTAL 333  --   --   --   --   --   --   --  137  TROPONINIHS 103*  --  85*  --   --   --   --   --   --    < > = values in this interval not displayed.    Estimated Creatinine Clearance: 29.2 mL/min (A) (by C-G formula based on SCr of 1.92 mg/dL (H)).   Medical History: Past Medical History:  Diagnosis Date   CAD (coronary artery disease)    Nonobstructive   COPD (chronic obstructive pulmonary disease) (HCC)    HLD (hyperlipidemia)    HTN (hypertension)    NICM (nonischemic cardiomyopathy) (HCC)    S/p St.Jude AICD placement   Permanent atrial fibrillation (HCC)    Assessment: 35 YOM presenting after being found down, CT angio chest with PE (lower lobe peripheral, low clot burden), hx of afib on Eliquis PTA however baseline Anti-Xa level on admission undetectable  suggesting no recent doses PTA.    7/17: Shift change RN notified pharmacy of incorrect rate in pump (3000u/h instead of 1300 u/h), for unknown period of time, also noted blood in urine and bleeding around mouth. Contacted MD and agreement on holding heparin gtt, obtaining heparin level and awaiting MD eval.  Heparin level came back therapeutic at 0.45 which may indicate heparin gtt was running at correct rate most of the time overnight.    7/18:  Hgb 13.7, plt 116 decreased from 15.5, 155 No further signs/symptoms of bleeding reported After discussion with Dr. Thedore Mins, ok to restart heparin gtt   Goal of Therapy:  Heparin level 0.3-0.7 units/ml Monitor platelets by anticoagulation protocol: Yes   Plan:  Start heparin infusion at 1300 units/hr Check heparin level in 8 hours Monitor daily CBC, heparin level and for signs and symptoms of bleeding  Stephenie Acres, PharmD PGY1 Pharmacy Resident 12/12/2022 11:23 AM

## 2022-12-12 NOTE — Progress Notes (Signed)
PT Cancellation Note  Patient Details Name: [name] MRN: [id number] DOB: 1936-03-16   Cancelled Treatment:    Reason Eval/Treat Not Completed: Medical issues which prohibited therapy  Patient now off anticoagulation due to hematuria (had been started due to +PEs). Also with elevated HR 120 at rest. Spoke with Dorene Grebe, RN and she agrees pt is not yet appropriate for activity.    Jerolyn Center, PT Acute Rehabilitation Services  Office [phone]  Zena Amos 12/12/2022, 10:17 AM

## 2022-12-12 NOTE — Progress Notes (Addendum)
Pharmacy Antibiotic Note  [name] is a 87 y.o. male admitted on 12/08/2022 with possible aspiration pneumonia and sepsis. Patient switched from vancomycin to unasyn. Pharmacy has been consulted for unasyn dosing.  Plan: Stop metronidazole Initiate IV unasyn 3g q12h Monitor clinical progress, renal function, and cultures  Height: 6\' 3"  (190.5 cm) Weight: 72.1 kg (158 lb 15.2 oz) IBW/kg (Calculated) : 84.5  Temp (24hrs), Avg:96.3 F (35.7 C), Min:93.8 F (34.3 C), Max:98 F (36.7 C)  Recent Labs  Lab 12/11/2022 1928 12/14/2022 1938 12/06/2022 2215 12/11/22 0017 12/11/22 0344 12/11/22 0710 12/11/22 0823 12/11/22 1238 12/12/22 0611  WBC 9.7  --   --   --  11.3*  --  11.7*  --  8.2  CREATININE 2.15* 2.10*  --   --  1.85*  --  2.04*  --  1.92*  LATICACIDVEN  --   --  4.1* 3.5*  --  4.5*  --  2.6*  --     Estimated Creatinine Clearance: 29.2 mL/min (A) (by C-G formula based on SCr of 1.92 mg/dL (H)).    Allergies  Allergen Reactions   Prednisone Other (See Comments)    Psychosis    Zestril [Lisinopril]     Unknown reaction   Glucosamine Rash    Antimicrobials this admission: 7/16 IV cefepime 2g q24h  >> 7/18 7/16 IV metronidazole 500 mg BID >> 7/18 7/16 IV vancomycin 1750 mg x1 7/18 IV unasyn 3g q12h>>  Dose adjustments this admission:   Microbiology results: 7/17 BCx: no growth at <12h 7/17 MRSA PCR: not detected  Thank you for allowing pharmacy to be a part of this patient's care.   Stephenie Acres, PharmD PGY1 Pharmacy Resident 12/12/2022 10:19 AM

## 2022-12-12 NOTE — Plan of Care (Signed)
  Problem: Fluid Volume: Goal: Ability to maintain a balanced intake and output will improve Outcome: Progressing   

## 2022-12-12 NOTE — Progress Notes (Addendum)
PROGRESS NOTE    [name]  ZOX:[id number] DOB: 1936-01-04 DOA: 12/12/2022 PCP: Eartha Inch, MD   Brief Narrative: [name] is a 87 y.o. male with a history of nonischemic cardiomyopathy, chronic heart failure with reduced EF, permanent atrial fibrillation on Eliquis, history of biventricular ICD placement, hypertension, hyperlipidemia, COPD.  Patient presented secondary to being found down.  On arrival to the emergency department, patient was found to have atrial fibrillation with rapid ventricular response, tachypnea, elevated temperature with concern for sepsis.  Workup revealed evidence of right upper lobe pneumonia in addition to evidence of bilateral peripheral PE.  Labs are significant for hyponatremia and AKI consistent with significant dehydration.  Patient started on empiric antibiotics in addition to anticoagulation.  Hospitalization complicated by development of hematuria and persistent atrial fibrillation with RVR and hypotension.   Assessment/Plan:  Severe dehydration from falling down at home, inability to eat or drink for several days with AKI and hypernatremia.  Hydrate, switch fluids to D5W, PT OT, still has encephalopathy and unable to cooperate in exam or interview, prognosis still guarded, continue to monitor.  De-escalate antibiotics, UA stable, possible aspiration pneumonia for which she will be placed on Unasyn on 12/12/2022, 1 out of 4 blood cultures likely contamination.  Follow final cultures.  PT OT and speech once he is able to cooperate.  Sepsis pathophysiology present upon admission has resolved.  Right upper lobe pneumonia See above  Acute metabolic encephalopathy Likely secondary to acute illness and likely contributed to by dehydration and hypernatremia.  CT head and C-spine nonacute, no focal deficits or headache.  Monitor.  Acute pulmonary embolism Noted on admission on CT angio of the chest.  Peripheral bilateral pulmonary emboli with  low clot burden and unlikely right heart strain.  Patient is on Eliquis as an outpatient for atrial fibrillation.  Patient transition to heparin IV heparin.  Echo noted with reduction in RV systolic function.  Monitor.  Acute respiratory failure with hypoxia Secondary to evidence of pneumonia on imaging.  Patient was placed on nonrebreather mask for SpO2 in the 70s.  Now weaned off supplemental oxygen.  Respiratory failure appears to be resolved.  Permanent atrial fibrillation with RVR Patient is on metoprolol, Eliquis and digoxin as an outpatient.  Patient with RVR this admission in the setting of severe dehydration and acute PE.  Cardiology following on amiodarone drip.  Elevated AST/ALT, Hyperbilirubinemia CT abdomen/pelvis without etiology for elevations.  Shock liver due to hypotension improving.  AKI Baseline creatinine of about 1-1.1 from outside records.  Extremely dehydrated, has Foley catheter continue to hydrate and monitor.  No obstruction on CT abdomen pelvis.  Elevated troponin Troponin of 103 on admission with delta of 85.  No chest pain.  Likely demand ischemia related to fibrillation with RVR.  Chronic heart failure with reduced EF LVEF listed as 40-45%.  Patient is status post biventricular ICD placement.  ENP elevated due to combination of chronic left and now right heart strain.  Clinically dehydrated.  Blood pressure too low for beta-blocker, ACE/ARB medications at this point.  Fall Patient found down. CK total of 333. -PT/OT evaluation  Diabetes mellitus type 2 Patient with a history of diabetes mellitus.  Currently controlled with hemoglobin A1c of 5.6%.  Patient is not on medication management as an outpatient. -Carb modified diet once able to take p.o.  Hyperlipidemia Patient is on Lipitor as an outpatient which was held on admission secondary to n.p.o. status.  QTc prolongation Noted on  EKG.  Complicated by bundle branch block.  Patient is now on  amiodarone. -On telemetry defer to cardiology.   DVT prophylaxis: Heparin IV (held) Code Status:   Code Status: DNR Family Communication: Nephew in detail phone #[phone] on 12/12/2022 >> DNR Disposition Plan: SNF   Consultants:  Cardiology, Pall Care  Procedures:   CT chest abdomen pelvis.   1. Positive examination for peripheral lower lobe pulmonary emboli. No significant central pulmonary embolus. Low clot burden indicates right heart strain would be unlikely. 2. Airspace disease in the right upper lung is likely pneumonia. 3. Diffuse emphysematous changes in the lungs. Chronic bronchitic changes. 4. Aortic atherosclerosis. 5. Heterogeneous striated nephrograms bilaterally likely indicate pyelonephritis. 6. Prostate gland is enlarged.  Echocardiogram.  1. Left ventricular ejection fraction, by estimation, is 50 to 55%. The left ventricle has low normal function. Left ventricular endocardial border not optimally defined to evaluate regional wall motion. Left ventricular diastolic function could not be evaluated.  2. Right ventricular systolic function is moderately reduced. The right ventricular size is normal.  3. Right atrial size was severely dilated.  4. The mitral valve is normal in structure. Trivial mitral valve regurgitation. No evidence of mitral stenosis.  5. The aortic valve is normal in structure. Aortic valve regurgitation is not visualized. No aortic stenosis is present.  6. The inferior vena cava is dilated in size with <50% respiratory variability, suggesting right atrial pressure of 15 mmHg. Conclusion(s)/Recommendation(s): Technically very limited study with poor sound wave transmission. LV never seen well but EF appears preserved on apica images. RV is moderately hypokientic. There is a pcing wire evident in the coronary sinus.    CT head and C-spine.  Nonacute.  Antimicrobials: Unasyn started 12/12/2022   Subjective:  Patient in bed quite sleepy, arousable, denies  any headache but not a heritable historian, appears to be in no discomfort.  Objective: BP 116/88 (BP Location: Right Arm)   Pulse (!) 120   Temp 98 F (36.7 C) (Oral)   Resp (!) 24   Ht 6\' 3"  (1.905 m)   Wt 72.1 kg   SpO2 90%   BMI 19.87 kg/m   Examination:  Minimally responsive in bed, unable to answer questions reliably, moving all 4 remedies by himself, Foley catheter in place [place].AT,PERRAL Supple Neck, No JVD,   Symmetrical Chest wall movement, Good air movement bilaterally, CTAB RRR,No Gallops, Rubs or new Murmurs,  +ve B.Sounds, Abd Soft, No tenderness,   No Cyanosis, Clubbing or edema     Data Reviewed: I have personally reviewed following labs and imaging studies  Recent Labs  Lab 12/21/2022 1928 12/02/2022 1938 12/11/22 0344 12/11/22 0823 12/11/22 0830 12/11/22 1918 12/12/22 0611  WBC 9.7  --  11.3* 11.7*  --   --  8.2  HGB 14.1   < > 14.4 14.5 15.3 15.5 13.7  HCT 44.2   < > 46.5 47.8 45.0 50.0 43.7  PLT 179  --  145* 155  --   --  116*  MCV 98.7  --  102.6* 102.1*  --   --  100.0  MCH 31.5  --  31.8 31.0  --   --  31.4  MCHC 31.9  --  31.0 30.3  --   --  31.4  RDW 16.1*  --  16.3* 16.2*  --   --  15.9*   < > = values in this interval not displayed.    Recent Labs  Lab 12/20/2022 1928 11/29/2022 1938 12/23/2022 2215  12/11/22 0017 12/11/22 0344 12/11/22 0710 12/11/22 0823 12/11/22 0830 12/11/22 1238 12/11/22 1918 12/12/22 0611  NA 156* 155*  155*  --   --  150*  --  152* 152*  --  150* 150*  K 4.6 5.0  5.0  --   --  4.1  --  4.3 4.2  --   --  3.7  CL 118* 119*  --   --  114*  --  118*  --   --   --  114*  CO2 21*  --   --   --  22  --  21*  --   --   --  23  ANIONGAP 17*  --   --   --  14  --  13  --   --   --  13  GLUCOSE 134* 132*  --   --  167*  --  165*  --   --   --  163*  BUN 44* 57*  --   --  44*  --  44*  --   --   --  48*  CREATININE 2.15* 2.10*  --   --  1.85*  --  2.04*  --   --   --  1.92*  AST 327*  --   --   --  333*  --  376*  --    --   --  165*  ALT 173*  --   --   --  196*  --  231*  --   --   --  187*  ALKPHOS 109  --   --   --  120  --  121  --   --   --  99  BILITOT 3.9*  --   --   --  3.3*  --  3.1*  --   --   --  2.0*  ALBUMIN 3.0*  --   --   --  2.8*  --  2.9*  --   --   --  2.6*  CRP  --   --   --   --   --   --   --   --   --   --  7.2*  PROCALCITON  --   --   --   --   --   --   --   --   --   --  0.18  LATICACIDVEN  --   --  4.1* 3.5*  --  4.5*  --   --  2.6*  --   --   INR 1.7*  --   --   --   --   --   --   --   --   --   --   HGBA1C  --   --   --   --  5.6  --   --   --   --   --   --   AMMONIA  --   --   --   --   --   --   --   --   --  20 52*  BNP 1,420.9*  --   --   --   --   --   --   --   --   --  911.9*  MG 2.2  --   --   --   --   --   --   --   --   --  1.8  CALCIUM 8.8*  --   --   --  8.6*  --  8.6*  --   --   --  8.4*    Recent Labs  Lab 12-11-22 1928 Dec 11, 2022 2215 12/11/22 0017 12/11/22 0344 12/11/22 0710 12/11/22 0823 12/11/22 1238 12/11/22 1918 12/12/22 0611  CRP  --   --   --   --   --   --   --   --  7.2*  PROCALCITON  --   --   --   --   --   --   --   --  0.18  LATICACIDVEN  --  4.1* 3.5*  --  4.5*  --  2.6*  --   --   INR 1.7*  --   --   --   --   --   --   --   --   HGBA1C  --   --   --  5.6  --   --   --   --   --   AMMONIA  --   --   --   --   --   --   --  20 52*  BNP 1,420.9*  --   --   --   --   --   --   --  911.9*  MG 2.2  --   --   --   --   --   --   --  1.8  CALCIUM 8.8*  --   --  8.6*  --  8.6*  --   --  8.4*     Recent Results (from the past 240 hour(s))  Resp panel by RT-PCR (RSV, Flu A&B, Covid) Anterior Nasal Swab     Status: None   Collection Time: 2022/12/11  7:30 PM   Specimen: Anterior Nasal Swab  Result Value Ref Range Status   SARS Coronavirus 2 by RT PCR NEGATIVE NEGATIVE Final   Influenza A by PCR NEGATIVE NEGATIVE Final   Influenza B by PCR NEGATIVE NEGATIVE Final    Comment: (NOTE) The Xpert Xpress SARS-CoV-2/FLU/RSV plus assay is intended  as an aid in the diagnosis of influenza from Nasopharyngeal swab specimens and should not be used as a sole basis for treatment. Nasal washings and aspirates are unacceptable for Xpert Xpress SARS-CoV-2/FLU/RSV testing.  Fact Sheet for Patients: BloggerCourse.com  Fact Sheet for Healthcare Providers: SeriousBroker.it  This test is not yet approved or cleared by the [place] FDA and has been authorized for detection and/or diagnosis of SARS-CoV-2 by FDA under an Emergency Use Authorization (EUA). This EUA will remain in effect (meaning this test can be used) for the duration of the COVID-19 declaration under Section 564(b)(1) of the Act, 21 U.S.C. section 360bbb-3(b)(1), unless the authorization is terminated or revoked.     Resp Syncytial Virus by PCR NEGATIVE NEGATIVE Final    Comment: (NOTE) Fact Sheet for Patients: BloggerCourse.com  Fact Sheet for Healthcare Providers: SeriousBroker.it  This test is not yet approved or cleared by the [place] FDA and has been authorized for detection and/or diagnosis of SARS-CoV-2 by FDA under an Emergency Use Authorization (EUA). This EUA will remain in effect (meaning this test can be used) for the duration of the COVID-19 declaration under Section 564(b)(1) of the Act, 21 U.S.C. section 360bbb-3(b)(1), unless the authorization is terminated or revoked.  Performed at [facility], [address] [place], [place] [address]   Culture, blood (  Routine X 2) w Reflex to ID Panel     Status: None (Preliminary result)   Collection Time: 12/11/22  6:25 AM   Specimen: BLOOD  Result Value Ref Range Status   Specimen Description BLOOD BLOOD LEFT FOREARM  Final   Special Requests   Final    BOTTLES DRAWN AEROBIC AND ANAEROBIC Blood Culture results may not be optimal due to an inadequate volume of blood received in culture bottles    Culture  Setup Time   Final    GRAM POSITIVE COCCI AEROBIC BOTTLE ONLY CRITICAL RESULT CALLED TO, READ BACK BY AND VERIFIED WITH: PHARMD EMILY S. [id number] FCP    Culture   Final    CULTURE REINCUBATED FOR BETTER GROWTH Performed at [facility], [address], [place], [place] [address]    Report Status PENDING  Incomplete  Blood Culture ID Panel (Reflexed)     Status: Abnormal   Collection Time: 12/11/22  6:25 AM  Result Value Ref Range Status   Enterococcus faecalis NOT DETECTED NOT DETECTED Final   Enterococcus Faecium NOT DETECTED NOT DETECTED Final   Listeria monocytogenes NOT DETECTED NOT DETECTED Final   Staphylococcus species NOT DETECTED NOT DETECTED Final   Staphylococcus aureus (BCID) NOT DETECTED NOT DETECTED Final   Staphylococcus epidermidis NOT DETECTED NOT DETECTED Final   Staphylococcus lugdunensis NOT DETECTED NOT DETECTED Final   Streptococcus species DETECTED (A) NOT DETECTED Final    Comment: Not Enterococcus species, Streptococcus agalactiae, Streptococcus pyogenes, or Streptococcus pneumoniae. CRITICAL RESULT CALLED TO, READ BACK BY AND VERIFIED WITH: PHARMD EMILY S. [id number] FCP    Streptococcus agalactiae NOT DETECTED NOT DETECTED Final   Streptococcus pneumoniae NOT DETECTED NOT DETECTED Final   Streptococcus pyogenes NOT DETECTED NOT DETECTED Final   A.calcoaceticus-baumannii NOT DETECTED NOT DETECTED Final   Bacteroides fragilis NOT DETECTED NOT DETECTED Final   Enterobacterales NOT DETECTED NOT DETECTED Final   Enterobacter cloacae complex NOT DETECTED NOT DETECTED Final   Escherichia coli NOT DETECTED NOT DETECTED Final   Klebsiella aerogenes NOT DETECTED NOT DETECTED Final   Klebsiella oxytoca NOT DETECTED NOT DETECTED Final   Klebsiella pneumoniae NOT DETECTED NOT DETECTED Final   Proteus species NOT DETECTED NOT DETECTED Final   Salmonella species NOT DETECTED NOT DETECTED Final   Serratia marcescens NOT DETECTED NOT DETECTED  Final   Haemophilus influenzae NOT DETECTED NOT DETECTED Final   Neisseria meningitidis NOT DETECTED NOT DETECTED Final   Pseudomonas aeruginosa NOT DETECTED NOT DETECTED Final   Stenotrophomonas maltophilia NOT DETECTED NOT DETECTED Final   Candida albicans NOT DETECTED NOT DETECTED Final   Candida auris NOT DETECTED NOT DETECTED Final   Candida glabrata NOT DETECTED NOT DETECTED Final   Candida krusei NOT DETECTED NOT DETECTED Final   Candida parapsilosis NOT DETECTED NOT DETECTED Final   Candida tropicalis NOT DETECTED NOT DETECTED Final   Cryptococcus neoformans/gattii NOT DETECTED NOT DETECTED Final    Comment: Performed at [facility], [address], [place], [place] [address]  Culture, blood (Routine X 2) w Reflex to ID Panel     Status: None (Preliminary result)   Collection Time: 12/11/22  6:30 AM   Specimen: BLOOD LEFT HAND  Result Value Ref Range Status   Specimen Description BLOOD LEFT HAND  Final   Special Requests   Final    BOTTLES DRAWN AEROBIC AND ANAEROBIC Blood Culture adequate volume   Culture   Final    NO GROWTH <12 HOURS  Performed at [facility], [address], [place], [place] [address]    Report Status PENDING  Incomplete  MRSA Next Gen by PCR, Nasal     Status: None   Collection Time: 12/11/22  8:40 AM   Specimen: Nasal Mucosa; Nasal Swab  Result Value Ref Range Status   MRSA by PCR Next Gen NOT DETECTED NOT DETECTED Final    Comment: (NOTE) The GeneXpert MRSA Assay (FDA approved for NASAL specimens only), is one component of a comprehensive MRSA colonization surveillance program. It is not intended to diagnose MRSA infection nor to guide or monitor treatment for MRSA infections. Test performance is not FDA approved in patients less than 27 years old. Performed at [facility], [address], [place], [place] [address]       Radiology Studies: DG Chest Port 1 View  Result Date: 12/12/2022 CLINICAL DATA:  Shortness of breath  EXAM: PORTABLE CHEST 1 VIEW COMPARISON:  12/11/2022 FINDINGS: Biventricular ICD/pacer leads in stable position. Stable cardiac enlargement. Unremarkable mediastinal contours. Opacity in the right upper lobe which is an infiltrate based on recent chest CT. No effusion or pneumothorax. IMPRESSION: Mild right upper lobe infiltrate without progression. Electronically Signed   By: Tiburcio Pea M.D.   On: 12/12/2022 07:10   ECHOCARDIOGRAM COMPLETE  Result Date: 12/11/2022    ECHOCARDIOGRAM REPORT   Patient Name:   [name] Date of Exam: 12/11/2022 Medical Rec #:  [id number]        Height:       75.0 in Accession #:    [id number]       Weight:       170.0 lb Date of Birth:  1936/05/09        BSA:          2.048 m Patient Age:    86 years         BP:           101/83 mmHg Patient Gender: M                HR:           104 bpm. Exam Location:  Inpatient Procedure: 2D Echo, Color Doppler and Cardiac Doppler Indications:    Elevated troponin  History:        Patient has prior history of Echocardiogram examinations, most                 recent 03/04/2022. Cardiomyopathy and HFrEF, CAD, Defibrillator,                 COPD, Arrythmias:Atrial Fibrillation, Signs/Symptoms:Altered                 Mental Status; Risk Factors:Hypertension and Dyslipidemia.  Sonographer:    Milda Smart Referring Phys: [id number] CAROLE N HALL  Sonographer Comments: Image acquisition challenging due to patient body habitus, Image acquisition challenging due to uncooperative patient and Image acquisition challenging due to respiratory motion. Pt could not be optimally positioned. IMPRESSIONS  1. Left ventricular ejection fraction, by estimation, is 50 to 55%. The left ventricle has low normal function. Left ventricular endocardial border not optimally defined to evaluate regional wall motion. Left ventricular diastolic function could not be evaluated.  2. Right ventricular systolic function is moderately reduced. The right ventricular size  is normal.  3. Right atrial size was severely dilated.  4. The mitral valve is normal in structure. Trivial mitral valve regurgitation. No evidence of mitral stenosis.  5. The aortic valve is normal in structure. Aortic valve regurgitation is not visualized. No aortic stenosis is present.  6. The inferior vena cava is dilated in size with <50% respiratory variability, suggesting right atrial pressure of 15 mmHg. Conclusion(s)/Recommendation(s): Technically very limited study with poor sound wave transmission. LV never seen well but EF appears preserved on apica images. RV is moderately hypokientic. There is a pcing wire evident in the coronary sinus. FINDINGS  Left Ventricle: Left ventricular ejection fraction, by estimation, is 50 to 55%. The left ventricle has low normal function. Left ventricular endocardial border not optimally defined to evaluate regional wall motion. The left ventricular internal cavity  size was normal in size. There is no left ventricular hypertrophy. Left ventricular diastolic function could not be evaluated due to atrial fibrillation. Left ventricular diastolic function could not be evaluated. Right Ventricle: The right ventricular size is normal. No increase in right ventricular wall thickness. Right ventricular systolic function is moderately reduced. Left Atrium: Left atrial size was normal in size. Right Atrium: Right atrial size was severely dilated. Pericardium: There is no evidence of pericardial effusion. Mitral Valve: The mitral valve is normal in structure. Mild mitral annular calcification. Trivial mitral valve regurgitation. No evidence of mitral valve stenosis. Tricuspid Valve: The tricuspid valve is not well visualized. Tricuspid valve regurgitation is mild . No evidence of tricuspid stenosis. Aortic Valve: The aortic valve is normal in structure. Aortic valve regurgitation is not visualized. No aortic stenosis is present. Pulmonic Valve: The pulmonic valve was normal in  structure. Pulmonic valve regurgitation is not visualized. No evidence of pulmonic stenosis. Aorta: The aortic root is normal in size and structure. Venous: The inferior vena cava is dilated in size with less than 50% respiratory variability, suggesting right atrial pressure of 15 mmHg. IAS/Shunts: No atrial level shunt detected by color flow Doppler. Additional Comments: A device lead is visualized.  LEFT ATRIUM           Index        RIGHT ATRIUM           Index LA Vol (A4C): 52.5 ml 25.64 ml/m  RA Area:     39.40 cm                                    RA Volume:   163.00 ml 79.59 ml/m Arvilla Meres MD Electronically signed by Arvilla Meres MD Signature Date/Time: 12/11/2022/1:49:21 PM    Final    DG CHEST PORT 1 VIEW  Result Date: 12/11/2022 CLINICAL DATA:  Unwitnessed fall. EXAM: PORTABLE CHEST 1 VIEW COMPARISON:  12/17/2022 FINDINGS: The lungs are clear without focal pneumonia, edema, pneumothorax or pleural effusion. Cardiopericardial silhouette is at upper limits of normal for size. Left pacer/AICD again noted. No acute bony abnormality. Telemetry leads overlie the chest. IMPRESSION: No active disease. Electronically Signed   By: Kennith Center M.D.   On: 12/11/2022 07:35   CT Angio Chest PE W and/or Wo Contrast  Result Date: 12/12/2022 CLINICAL DATA:  Pulmonary embolus suspected with high probability. Fall. Sepsis. EXAM: CT ANGIOGRAPHY CHEST CT ABDOMEN AND PELVIS WITH CONTRAST TECHNIQUE: Multidetector CT imaging of the chest was performed using the standard protocol during bolus administration of intravenous contrast. Multiplanar CT image reconstructions and MIPs were obtained to evaluate the vascular anatomy. Multidetector CT imaging of the abdomen and pelvis was performed using the standard protocol during bolus administration of intravenous  contrast. RADIATION DOSE REDUCTION: This exam was performed according to the departmental dose-optimization program which includes automated exposure  control, adjustment of the mA and/or kV according to patient size and/or use of iterative reconstruction technique. CONTRAST:  75mL OMNIPAQUE IOHEXOL 350 MG/ML SOLN COMPARISON:  CT chest 07/25/2009 FINDINGS: CTA CHEST FINDINGS Cardiovascular: Good visualization of the central and segmental pulmonary arteries. Filling defects are demonstrated in bilateral lower lobe subsegmental pulmonary arteries suggesting peripheral pulmonary embolus. No large central pulmonary emboli. Clot burden is very low. No contrast material is demonstrated in the left ventricle limiting evaluation of the RV to LV ratio. The cardiac chambers appear grossly symmetrical. Due to low clot burden, right heart strain is unlikely. There is diffuse cardiac enlargement with reflux of contrast material into the hepatic veins suggesting right heart failure. Normal caliber thoracic aorta. Calcification of the aorta and coronary arteries. Mediastinum/Nodes: No enlarged mediastinal, hilar, or axillary lymph nodes. Thyroid gland, trachea, and esophagus demonstrate no significant findings. Venous gas is present likely resulting from intravenous injections. Cardiac pacemaker. Lungs/Pleura: Diffuse emphysematous changes in the lungs. Bronchial wall thickening consistent with chronic bronchitis. Patchy airspace infiltrates in the right upper lung likely representing pneumonia. Aspiration could also have this appearance. No pleural effusions. No pneumothorax. Musculoskeletal: Degenerative changes in the spine and shoulders. Review of the MIP images confirms the above findings. CT ABDOMEN and PELVIS FINDINGS Hepatobiliary: No focal liver abnormality is seen. Status post cholecystectomy. No biliary dilatation. Pancreas: Unremarkable. No pancreatic ductal dilatation or surrounding inflammatory changes. Spleen: Normal in size without focal abnormality. Adrenals/Urinary Tract: No adrenal gland nodules. Nephrograms are diffusely heterogeneous and striated  bilaterally, likely indicating pyelonephritis. No hydronephrosis or hydroureter. Bladder is decompressed with a Foley catheter in place. Stomach/Bowel: Stomach is within normal limits. Appendix is not identified. No evidence of bowel wall thickening, distention, or inflammatory changes. Vascular/Lymphatic: Diffuse aortic atherosclerosis with diffuse calcification. No aneurysm or dissection. Reproductive: Prostate gland is mildly enlarged. Other: No free air or free fluid in the abdomen. Abdominal wall musculature appears intact. Musculoskeletal: Degenerative changes in the spine. Review of the MIP images confirms the above findings. IMPRESSION: 1. Positive examination for peripheral lower lobe pulmonary emboli. No significant central pulmonary embolus. Low clot burden indicates right heart strain would be unlikely. 2. Airspace disease in the right upper lung is likely pneumonia. 3. Diffuse emphysematous changes in the lungs. Chronic bronchitic changes. 4. Aortic atherosclerosis. 5. Heterogeneous striated nephrograms bilaterally likely indicate pyelonephritis. 6. Prostate gland is enlarged. Critical Value/emergent results were called by telephone at the time of interpretation on 12/13/2022 at 9:43 pm to provider Vonita Moss , who verbally acknowledged these results. Electronically Signed   By: Burman Nieves M.D.   On: 12/11/2022 21:52   CT ABDOMEN PELVIS W CONTRAST  Result Date: 11/26/2022 CLINICAL DATA:  Pulmonary embolus suspected with high probability. Fall. Sepsis. EXAM: CT ANGIOGRAPHY CHEST CT ABDOMEN AND PELVIS WITH CONTRAST TECHNIQUE: Multidetector CT imaging of the chest was performed using the standard protocol during bolus administration of intravenous contrast. Multiplanar CT image reconstructions and MIPs were obtained to evaluate the vascular anatomy. Multidetector CT imaging of the abdomen and pelvis was performed using the standard protocol during bolus administration of intravenous contrast.  RADIATION DOSE REDUCTION: This exam was performed according to the departmental dose-optimization program which includes automated exposure control, adjustment of the mA and/or kV according to patient size and/or use of iterative reconstruction technique. CONTRAST:  75mL OMNIPAQUE IOHEXOL 350 MG/ML SOLN COMPARISON:  CT chest 07/25/2009 FINDINGS:  CTA CHEST FINDINGS Cardiovascular: Good visualization of the central and segmental pulmonary arteries. Filling defects are demonstrated in bilateral lower lobe subsegmental pulmonary arteries suggesting peripheral pulmonary embolus. No large central pulmonary emboli. Clot burden is very low. No contrast material is demonstrated in the left ventricle limiting evaluation of the RV to LV ratio. The cardiac chambers appear grossly symmetrical. Due to low clot burden, right heart strain is unlikely. There is diffuse cardiac enlargement with reflux of contrast material into the hepatic veins suggesting right heart failure. Normal caliber thoracic aorta. Calcification of the aorta and coronary arteries. Mediastinum/Nodes: No enlarged mediastinal, hilar, or axillary lymph nodes. Thyroid gland, trachea, and esophagus demonstrate no significant findings. Venous gas is present likely resulting from intravenous injections. Cardiac pacemaker. Lungs/Pleura: Diffuse emphysematous changes in the lungs. Bronchial wall thickening consistent with chronic bronchitis. Patchy airspace infiltrates in the right upper lung likely representing pneumonia. Aspiration could also have this appearance. No pleural effusions. No pneumothorax. Musculoskeletal: Degenerative changes in the spine and shoulders. Review of the MIP images confirms the above findings. CT ABDOMEN and PELVIS FINDINGS Hepatobiliary: No focal liver abnormality is seen. Status post cholecystectomy. No biliary dilatation. Pancreas: Unremarkable. No pancreatic ductal dilatation or surrounding inflammatory changes. Spleen: Normal in size  without focal abnormality. Adrenals/Urinary Tract: No adrenal gland nodules. Nephrograms are diffusely heterogeneous and striated bilaterally, likely indicating pyelonephritis. No hydronephrosis or hydroureter. Bladder is decompressed with a Foley catheter in place. Stomach/Bowel: Stomach is within normal limits. Appendix is not identified. No evidence of bowel wall thickening, distention, or inflammatory changes. Vascular/Lymphatic: Diffuse aortic atherosclerosis with diffuse calcification. No aneurysm or dissection. Reproductive: Prostate gland is mildly enlarged. Other: No free air or free fluid in the abdomen. Abdominal wall musculature appears intact. Musculoskeletal: Degenerative changes in the spine. Review of the MIP images confirms the above findings. IMPRESSION: 1. Positive examination for peripheral lower lobe pulmonary emboli. No significant central pulmonary embolus. Low clot burden indicates right heart strain would be unlikely. 2. Airspace disease in the right upper lung is likely pneumonia. 3. Diffuse emphysematous changes in the lungs. Chronic bronchitic changes. 4. Aortic atherosclerosis. 5. Heterogeneous striated nephrograms bilaterally likely indicate pyelonephritis. 6. Prostate gland is enlarged. Critical Value/emergent results were called by telephone at the time of interpretation on December 31, 2022 at 9:43 pm to provider Vonita Moss , who verbally acknowledged these results. Electronically Signed   By: Burman Nieves M.D.   On: December 31, 2022 21:52   CT Head Wo Contrast  Result Date: December 31, 2022 CLINICAL DATA:  Fall EXAM: CT HEAD WITHOUT CONTRAST CT CERVICAL SPINE WITHOUT CONTRAST TECHNIQUE: Multidetector CT imaging of the head and cervical spine was performed following the standard protocol without intravenous contrast. Multiplanar CT image reconstructions of the cervical spine were also generated. RADIATION DOSE REDUCTION: This exam was performed according to the departmental  dose-optimization program which includes automated exposure control, adjustment of the mA and/or kV according to patient size and/or use of iterative reconstruction technique. COMPARISON:  None Available. FINDINGS: CT HEAD FINDINGS Brain: There is no mass, hemorrhage or extra-axial collection. There is generalized atrophy without lobar predilection. There is hypoattenuation of the periventricular white matter, most commonly indicating chronic ischemic microangiopathy. Old left occipital infarct. Vascular: Atherosclerotic calcification of the vertebral and internal carotid arteries at the skull base. No abnormal hyperdensity of the major intracranial arteries or dural venous sinuses. Skull: The visualized skull base, calvarium and extracranial soft tissues are normal. Sinuses/Orbits: No fluid levels or advanced mucosal thickening of the visualized paranasal sinuses. No  mastoid or middle ear effusion. The orbits are normal. CT CERVICAL SPINE FINDINGS Alignment: Reversal of normal cervical lordosis. Grade 1 anterolisthesis at C3-4 and grade 1 retrolisthesis at C4-5 and C5-6. Skull base and vertebrae: No acute fracture. Soft tissues and spinal canal: No prevertebral fluid or swelling. No visible canal hematoma. Disc levels: No advanced spinal canal or neural foraminal stenosis. Upper chest: Severe facet arthrosis at C2-3 and C3-4. No high-grade spinal canal stenosis. Other: Normal visualized paraspinal cervical soft tissues. IMPRESSION: 1. No acute intracranial abnormality. 2. 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 3. No acute fracture or static subluxation of the cervical spine. Electronically Signed   By: Deatra Robinson M.D.   On: 2022/12/17 21:33   CT Cervical Spine Wo Contrast  Result Date: 12/17/2022 CLINICAL DATA:  Fall EXAM: CT HEAD WITHOUT CONTRAST CT CERVICAL SPINE WITHOUT CONTRAST TECHNIQUE: Multidetector CT imaging of the head and cervical spine was performed following the  standard protocol without intravenous contrast. Multiplanar CT image reconstructions of the cervical spine were also generated. RADIATION DOSE REDUCTION: This exam was performed according to the departmental dose-optimization program which includes automated exposure control, adjustment of the mA and/or kV according to patient size and/or use of iterative reconstruction technique. COMPARISON:  None Available. FINDINGS: CT HEAD FINDINGS Brain: There is no mass, hemorrhage or extra-axial collection. There is generalized atrophy without lobar predilection. There is hypoattenuation of the periventricular white matter, most commonly indicating chronic ischemic microangiopathy. Old left occipital infarct. Vascular: Atherosclerotic calcification of the vertebral and internal carotid arteries at the skull base. No abnormal hyperdensity of the major intracranial arteries or dural venous sinuses. Skull: The visualized skull base, calvarium and extracranial soft tissues are normal. Sinuses/Orbits: No fluid levels or advanced mucosal thickening of the visualized paranasal sinuses. No mastoid or middle ear effusion. The orbits are normal. CT CERVICAL SPINE FINDINGS Alignment: Reversal of normal cervical lordosis. Grade 1 anterolisthesis at C3-4 and grade 1 retrolisthesis at C4-5 and C5-6. Skull base and vertebrae: No acute fracture. Soft tissues and spinal canal: No prevertebral fluid or swelling. No visible canal hematoma. Disc levels: No advanced spinal canal or neural foraminal stenosis. Upper chest: Severe facet arthrosis at C2-3 and C3-4. No high-grade spinal canal stenosis. Other: Normal visualized paraspinal cervical soft tissues. IMPRESSION: 1. No acute intracranial abnormality. 2. 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 3. No acute fracture or static subluxation of the cervical spine. Electronically Signed   By: Deatra Robinson M.D.   On: 12-17-2022 21:33   DG Chest Portable 1 View  Result  Date: 12-17-2022 CLINICAL DATA:  Altered mental status EXAM: PORTABLE CHEST 1 VIEW COMPARISON:  05/03/2010 FINDINGS: Interim placement of left-sided pacing device. Borderline cardiac size. Aortic atherosclerosis. No acute airspace disease, pleural effusion or pneumothorax. IMPRESSION: No active disease. Electronically Signed   By: Jasmine Pang M.D.   On: December 17, 2022 20:44      LOS: 1 day   Signature  -    Susa Raring M.D on 12/12/2022 at 12:04 PM   -  To page go to [email or web address]

## 2022-12-12 NOTE — Progress Notes (Signed)
PHARMACY - PHYSICIAN COMMUNICATION CRITICAL VALUE ALERT - BLOOD CULTURE IDENTIFICATION (BCID)  [name] is an 87 y.o. male who presented to [facility] on 12/23/2022 with a chief complaint of fall and altered mental status.   Assessment:  87 year old male admitted with altered mental status. Concern for pneumonia. Blood cultures with 1/4 Strep species- could be contaminant.   Name of physician (or Provider) Contacted: P. Thedore Mins  Current antibiotics: unasyn  Changes to prescribed antibiotics recommended:  None based on this culture  Results for orders placed or performed during the hospital encounter of 12/07/2022  Blood Culture ID Panel (Reflexed) (Collected: 12/11/2022  6:25 AM)  Result Value Ref Range   Enterococcus faecalis NOT DETECTED NOT DETECTED   Enterococcus Faecium NOT DETECTED NOT DETECTED   Listeria monocytogenes NOT DETECTED NOT DETECTED   Staphylococcus species NOT DETECTED NOT DETECTED   Staphylococcus aureus (BCID) NOT DETECTED NOT DETECTED   Staphylococcus epidermidis NOT DETECTED NOT DETECTED   Staphylococcus lugdunensis NOT DETECTED NOT DETECTED   Streptococcus species DETECTED (A) NOT DETECTED   Streptococcus agalactiae NOT DETECTED NOT DETECTED   Streptococcus pneumoniae NOT DETECTED NOT DETECTED   Streptococcus pyogenes NOT DETECTED NOT DETECTED   A.calcoaceticus-baumannii NOT DETECTED NOT DETECTED   Bacteroides fragilis NOT DETECTED NOT DETECTED   Enterobacterales NOT DETECTED NOT DETECTED   Enterobacter cloacae complex NOT DETECTED NOT DETECTED   Escherichia coli NOT DETECTED NOT DETECTED   Klebsiella aerogenes NOT DETECTED NOT DETECTED   Klebsiella oxytoca NOT DETECTED NOT DETECTED   Klebsiella pneumoniae NOT DETECTED NOT DETECTED   Proteus species NOT DETECTED NOT DETECTED   Salmonella species NOT DETECTED NOT DETECTED   Serratia marcescens NOT DETECTED NOT DETECTED   Haemophilus influenzae NOT DETECTED NOT DETECTED   Neisseria meningitidis NOT  DETECTED NOT DETECTED   Pseudomonas aeruginosa NOT DETECTED NOT DETECTED   Stenotrophomonas maltophilia NOT DETECTED NOT DETECTED   Candida albicans NOT DETECTED NOT DETECTED   Candida auris NOT DETECTED NOT DETECTED   Candida glabrata NOT DETECTED NOT DETECTED   Candida krusei NOT DETECTED NOT DETECTED   Candida parapsilosis NOT DETECTED NOT DETECTED   Candida tropicalis NOT DETECTED NOT DETECTED   Cryptococcus neoformans/gattii NOT DETECTED NOT DETECTED    Della Goo 12/12/2022  10:56 AM

## 2022-12-12 NOTE — Progress Notes (Signed)
Progress Note  Patient Name: [name] Date of Encounter: 12/12/2022 Primary Cardiologist: None   Subjective   Overnight no events. Resting comfortably, still note AOX3.  Vital Signs    Vitals:   12/12/22 0125 12/12/22 0155 12/12/22 0400 12/12/22 0420  BP:   121/80 (!) 122/91  Pulse: 90 97 85 (!) 133  Resp: 15 17 (!) 21 (!) 23  Temp:  (!) 94.2 F (34.6 C)  (!) 96 F (35.6 C)  TempSrc:  Axillary  Axillary  SpO2: 95% 95% 90% 91%  Weight:      Height:        Intake/Output Summary (Last 24 hours) at 12/12/2022 0751 Last data filed at 12/12/2022 0605 Gross per 24 hour  Intake 1615.2 ml  Output 1000 ml  Net 615.2 ml   Filed Weights   Dec 25, 2022 1942 12/11/22 1840  Weight: 77.1 kg 72.1 kg    Physical Exam   GEN: No acute distress.   Neck: No JVD Cardiac: IRIR tachycardia, no murmurs, rubs, or gallops. No RV heave Respiratory: Clear to auscultation bilaterally.  Decreased effort GI: Soft, nontender, non-distended  MS: No edema   Labs   Telemetry: AF rates ~ 114 with one 5 beat run of NSVT   Chemistry Recent Labs  Lab 12/11/22 0344 12/11/22 0823 12/11/22 0830 12/11/22 1918 12/12/22 0611  NA 150* 152* 152* 150* 150*  K 4.1 4.3 4.2  --  3.7  CL 114* 118*  --   --  114*  CO2 22 21*  --   --  23  GLUCOSE 167* 165*  --   --  163*  BUN 44* 44*  --   --  48*  CREATININE 1.85* 2.04*  --   --  1.92*  CALCIUM 8.6* 8.6*  --   --  8.4*  PROT 5.9* 6.1*  --   --  5.4*  ALBUMIN 2.8* 2.9*  --   --  2.6*  AST 333* 376*  --   --  165*  ALT 196* 231*  --   --  187*  ALKPHOS 120 121  --   --  99  BILITOT 3.3* 3.1*  --   --  2.0*  GFRNONAA 35* 32*  --   --  34*  ANIONGAP 14 13  --   --  13     Hematology Recent Labs  Lab 12/11/22 0344 12/11/22 0823 12/11/22 0830 12/11/22 1918 12/12/22 0611  WBC 11.3* 11.7*  --   --  8.2  RBC 4.53 4.68  --   --  4.37  HGB 14.4 14.5 15.3 15.5 13.7  HCT 46.5 47.8 45.0 50.0 43.7  MCV 102.6* 102.1*  --   --  100.0  MCH  31.8 31.0  --   --  31.4  MCHC 31.0 30.3  --   --  31.4  RDW 16.3* 16.2*  --   --  15.9*  PLT 145* 155  --   --  116*    Cardiac EnzymesNo results for input(s): "TROPONINI" in the last 168 hours. No results for input(s): "TROPIPOC" in the last 168 hours.   BNP Recent Labs  Lab 12-25-22 1928 12/12/22 0611  BNP 1,420.9* 911.9*     DDimer No results for input(s): "DDIMER" in the last 168 hours.   Cardiac Studies   Cardiac Studies & Procedures       ECHOCARDIOGRAM  ECHOCARDIOGRAM COMPLETE 12/11/2022  Narrative ECHOCARDIOGRAM REPORT    Patient Name:   [name]  [name] Date of Exam: 12/11/2022 Medical Rec #:  [id number]        Height:       75.0 in Accession #:    [id number]       Weight:       170.0 lb Date of Birth:  Mar 13, 1936        BSA:          2.048 m Patient Age:    86 years         BP:           101/83 mmHg Patient Gender: M                HR:           104 bpm. Exam Location:  Inpatient  Procedure: 2D Echo, Color Doppler and Cardiac Doppler  Indications:    Elevated troponin  History:        Patient has prior history of Echocardiogram examinations, most recent 03/04/2022. Cardiomyopathy and HFrEF, CAD, Defibrillator, COPD, Arrythmias:Atrial Fibrillation, Signs/Symptoms:Altered Mental Status; Risk Factors:Hypertension and Dyslipidemia.  Sonographer:    Milda Smart Referring Phys: [id number] CAROLE N HALL   Sonographer Comments: Image acquisition challenging due to patient body habitus, Image acquisition challenging due to uncooperative patient and Image acquisition challenging due to respiratory motion. Pt could not be optimally positioned. IMPRESSIONS   1. Left ventricular ejection fraction, by estimation, is 50 to 55%. The left ventricle has low normal function. Left ventricular endocardial border not optimally defined to evaluate regional wall motion. Left ventricular diastolic function could not be evaluated. 2. Right ventricular systolic function is  moderately reduced. The right ventricular size is normal. 3. Right atrial size was severely dilated. 4. The mitral valve is normal in structure. Trivial mitral valve regurgitation. No evidence of mitral stenosis. 5. The aortic valve is normal in structure. Aortic valve regurgitation is not visualized. No aortic stenosis is present. 6. The inferior vena cava is dilated in size with <50% respiratory variability, suggesting right atrial pressure of 15 mmHg.  Conclusion(s)/Recommendation(s): Technically very limited study with poor sound wave transmission. LV never seen well but EF appears preserved on apica images. RV is moderately hypokientic. There is a pcing wire evident in the coronary sinus.  FINDINGS Left Ventricle: Left ventricular ejection fraction, by estimation, is 50 to 55%. The left ventricle has low normal function. Left ventricular endocardial border not optimally defined to evaluate regional wall motion. The left ventricular internal cavity size was normal in size. There is no left ventricular hypertrophy. Left ventricular diastolic function could not be evaluated due to atrial fibrillation. Left ventricular diastolic function could not be evaluated.  Right Ventricle: The right ventricular size is normal. No increase in right ventricular wall thickness. Right ventricular systolic function is moderately reduced.  Left Atrium: Left atrial size was normal in size.  Right Atrium: Right atrial size was severely dilated.  Pericardium: There is no evidence of pericardial effusion.  Mitral Valve: The mitral valve is normal in structure. Mild mitral annular calcification. Trivial mitral valve regurgitation. No evidence of mitral valve stenosis.  Tricuspid Valve: The tricuspid valve is not well visualized. Tricuspid valve regurgitation is mild . No evidence of tricuspid stenosis.  Aortic Valve: The aortic valve is normal in structure. Aortic valve regurgitation is not visualized. No aortic  stenosis is present.  Pulmonic Valve: The pulmonic valve was normal in structure. Pulmonic valve regurgitation is not visualized. No evidence of pulmonic stenosis.  Aorta: The aortic root is  normal in size and structure.  Venous: The inferior vena cava is dilated in size with less than 50% respiratory variability, suggesting right atrial pressure of 15 mmHg.  IAS/Shunts: No atrial level shunt detected by color flow Doppler.  Additional Comments: A device lead is visualized.   LEFT ATRIUM           Index        RIGHT ATRIUM           Index LA Vol (A4C): 52.5 ml 25.64 ml/m  RA Area:     39.40 cm RA Volume:   163.00 ml 79.59 ml/m  Arvilla Meres MD Electronically signed by Arvilla Meres MD Signature Date/Time: 12/11/2022/1:49:21 PM    Final             Assessment & Plan   Permanent atrial fibrillation  NSVT With associated cor pulmonale (RV dilation and dysfunction likely related to AF - on IV amiodarone given hypotension - on IVF given medcial problems below - EKG today- Qtc has been prolonged by driven largely by bundle branch block) - resume AC whem able    Elevated troponin Type II MI related to PE - AC when able   Diabetes mellitus type 2 with HLD  - statin when LFTs normalize  As per primary - Severe Sepsis from RUL PNA - Acute PE complicated by hematuria - acute metabolic encephalopathy complicated by hyponatremia - Fall and rhabomyolysis - transaminitis and bilirubinemia  For questions or updates, please contact [facility] Please consult [email or web address] for contact info under Cardiology/STEMI.      Riley Lam, MD FASE [facility] Cardiologist [facility]  [address] [place], [address] [place], [place] [address] [phone]  7:51 AM

## 2022-12-12 NOTE — Plan of Care (Signed)
  Problem: Education: Goal: Ability to describe self-care measures that may prevent or decrease complications (Diabetes Survival Skills Education) will improve Outcome: Not Progressing   Problem: Coping: Goal: Ability to adjust to condition or change in health will improve Outcome: Not Progressing   Problem: Fluid Volume: Goal: Ability to maintain a balanced intake and output will improve Outcome: Not Progressing   Problem: Health Behavior/Discharge Planning: Goal: Ability to identify and utilize available resources and services will improve Outcome: Not Progressing Goal: Ability to manage health-related needs will improve Outcome: Not Progressing   Problem: Metabolic: Goal: Ability to maintain appropriate glucose levels will improve Outcome: Not Progressing   Problem: Nutritional: Goal: Maintenance of adequate nutrition will improve Outcome: Not Progressing Goal: Progress toward achieving an optimal weight will improve Outcome: Not Progressing   Problem: Skin Integrity: Goal: Risk for impaired skin integrity will decrease Outcome: Not Progressing   Problem: Tissue Perfusion: Goal: Adequacy of tissue perfusion will improve Outcome: Not Progressing   Problem: Education: Goal: Knowledge of General Education information will improve Description: Including pain rating scale, medication(s)/side effects and non-pharmacologic comfort measures Outcome: Not Progressing   Problem: Health Behavior/Discharge Planning: Goal: Ability to manage health-related needs will improve Outcome: Not Progressing   Problem: Clinical Measurements: Goal: Ability to maintain clinical measurements within normal limits will improve Outcome: Not Progressing Goal: Will remain free from infection Outcome: Not Progressing Goal: Diagnostic test results will improve Outcome: Not Progressing Goal: Respiratory complications will improve Outcome: Not Progressing Goal: Cardiovascular complication will  be avoided Outcome: Not Progressing   Problem: Activity: Goal: Risk for activity intolerance will decrease Outcome: Not Progressing   Problem: Nutrition: Goal: Adequate nutrition will be maintained Outcome: Not Progressing   Problem: Coping: Goal: Level of anxiety will decrease Outcome: Not Progressing   Problem: Elimination: Goal: Will not experience complications related to bowel motility Outcome: Not Progressing Goal: Will not experience complications related to urinary retention Outcome: Not Progressing   Problem: Pain Managment: Goal: General experience of comfort will improve Outcome: Not Progressing   Problem: Safety: Goal: Ability to remain free from injury will improve Outcome: Not Progressing   Problem: Skin Integrity: Goal: Risk for impaired skin integrity will decrease Outcome: Not Progressing

## 2022-12-12 NOTE — Progress Notes (Signed)
Peripherally Inserted Central Catheter Placement  The IV Nurse has discussed with the patient and/or persons authorized to consent for the patient, the purpose of this procedure and the potential benefits and risks involved with this procedure.  The benefits include less needle sticks, lab draws from the catheter, and the patient may be discharged home with the catheter. Risks include, but not limited to, infection, bleeding, blood clot (thrombus formation), and puncture of an artery; nerve damage and irregular heartbeat and possibility to perform a PICC exchange if needed/ordered by physician.  Alternatives to this procedure were also discussed.  Bard Power PICC patient education guide, fact sheet on infection prevention and patient information card has been provided to patient /or left at bedside.    PICC Placement Documentation  PICC Double Lumen 12/12/22 Right Basilic 41 cm 0 cm (Active)  Indication for Insertion or Continuance of Line Poor Vasculature-patient has had multiple peripheral attempts or PIVs lasting less than 24 hours 12/12/22 1704  Exposed Catheter (cm) 0 cm 12/12/22 1704  Site Assessment Clean, Dry, Intact 12/12/22 1704  Lumen #1 Status Flushed;Saline locked;Blood return noted 12/12/22 1704  Lumen #2 Status Flushed;Saline locked;Blood return noted 12/12/22 1704  Dressing Type Transparent;Securing device 12/12/22 1704  Dressing Status Antimicrobial disc in place;Clean, Dry, Intact 12/12/22 1704  Safety Lock Not Applicable 12/12/22 1704  Line Care Connections checked and tightened 12/12/22 1704  Line Adjustment (NICU/IV Team Only) No 12/12/22 1704  Dressing Intervention New dressing;Other (Comment) 12/12/22 1704  Dressing Change Due 12/19/22 12/12/22 1704       Annett Fabian 12/12/2022, 5:06 PM

## 2022-12-12 NOTE — Consult Note (Signed)
WOC Nurse Consult Note: Reason for Consult:consult for bilateral foot wounds, not pressure. Plantar aspect of bilateral feet at medial midfoot, circular, full thickness, L>R. Wound type:neuropathic Pressure Injury POA: N/A Measurement: Right foot: 0.8cm round x 0.5cm with pink wound bed and small amoutn serous to light yellow exudate. No periwound erythema or fluctuance. Left foot: 1.2cm round x 0.8cm with deep red wound bed and yellow, moderate exudate. No periwound erythema or fluctuance Wound bed:As noted above Drainage (amount, consistency, odor) As noted above Periwound: As noted above. Bilateral LEs and UE are dry and flaking.  Dressing procedure/placement/frequency: I have provided nursing with guidance in the care of these wounds using a soap and water cleanse followed by placement of size appropriate pieces of silver hydrofiber (Aquacel Ag+ Advantage, [employer] # [id number]) topped with dry gauze and secured with silicone foam for the heels. Legs and arms are to be moisturized with Eucerin cream daily. A sacral foam is to be placed and turning and repositioning to minimize time in the supine position is recommended. A mattress replacement is provided.  WOC nursing team will not follow, but will remain available to this patient, the nursing and medical teams.  Please re-consult if needed.  Thank you for inviting [place] to participate in this patient's Plan of Care.  Ladona Mow, MSN, RN, CNS, GNP, Leda Min, [employer], [employer] phone:  [phone]

## 2022-12-13 ENCOUNTER — Inpatient Hospital Stay (HOSPITAL_COMMUNITY): Payer: Medicare HMO

## 2022-12-13 DIAGNOSIS — R4182 Altered mental status, unspecified: Secondary | ICD-10-CM | POA: Diagnosis not present

## 2022-12-13 DIAGNOSIS — I2694 Multiple subsegmental pulmonary emboli without acute cor pulmonale: Secondary | ICD-10-CM | POA: Diagnosis not present

## 2022-12-13 DIAGNOSIS — I4891 Unspecified atrial fibrillation: Secondary | ICD-10-CM | POA: Diagnosis not present

## 2022-12-13 DIAGNOSIS — I4819 Other persistent atrial fibrillation: Secondary | ICD-10-CM | POA: Diagnosis not present

## 2022-12-13 DIAGNOSIS — R7881 Bacteremia: Secondary | ICD-10-CM

## 2022-12-13 DIAGNOSIS — A419 Sepsis, unspecified organism: Secondary | ICD-10-CM | POA: Diagnosis not present

## 2022-12-13 DIAGNOSIS — J189 Pneumonia, unspecified organism: Secondary | ICD-10-CM | POA: Diagnosis not present

## 2022-12-13 LAB — COMPREHENSIVE METABOLIC PANEL
ALT: 172 U/L — ABNORMAL HIGH (ref 0–44)
AST: 149 U/L — ABNORMAL HIGH (ref 15–41)
Albumin: 2.2 g/dL — ABNORMAL LOW (ref 3.5–5.0)
Alkaline Phosphatase: 95 U/L (ref 38–126)
Anion gap: 11 (ref 5–15)
BUN: 39 mg/dL — ABNORMAL HIGH (ref 8–23)
CO2: 21 mmol/L — ABNORMAL LOW (ref 22–32)
Calcium: 7.9 mg/dL — ABNORMAL LOW (ref 8.9–10.3)
Chloride: 114 mmol/L — ABNORMAL HIGH (ref 98–111)
Creatinine, Ser: 1.52 mg/dL — ABNORMAL HIGH (ref 0.61–1.24)
GFR, Estimated: 45 mL/min — ABNORMAL LOW (ref >60)
Glucose, Bld: 147 mg/dL — ABNORMAL HIGH (ref 70–99)
Potassium: 3.6 mmol/L (ref 3.5–5.1)
Sodium: 146 mmol/L — ABNORMAL HIGH (ref 135–145)
Total Bilirubin: 1.5 mg/dL — ABNORMAL HIGH (ref 0.3–1.2)
Total Protein: 5 g/dL — ABNORMAL LOW (ref 6.5–8.1)

## 2022-12-13 LAB — GLUCOSE, CAPILLARY
Glucose-Capillary: 129 mg/dL — ABNORMAL HIGH (ref 70–99)
Glucose-Capillary: 138 mg/dL — ABNORMAL HIGH (ref 70–99)
Glucose-Capillary: 138 mg/dL — ABNORMAL HIGH (ref 70–99)
Glucose-Capillary: 141 mg/dL — ABNORMAL HIGH (ref 70–99)
Glucose-Capillary: 145 mg/dL — ABNORMAL HIGH (ref 70–99)
Glucose-Capillary: 165 mg/dL — ABNORMAL HIGH (ref 70–99)

## 2022-12-13 LAB — CBC WITH DIFFERENTIAL/PLATELET
Abs Immature Granulocytes: 0.08 10*3/uL — ABNORMAL HIGH (ref 0.00–0.07)
Basophils Absolute: 0 10*3/uL (ref 0.0–0.1)
Basophils Relative: 0 %
Eosinophils Absolute: 0.1 10*3/uL (ref 0.0–0.5)
Eosinophils Relative: 1 %
HCT: 41.8 % (ref 39.0–52.0)
Hemoglobin: 13.2 g/dL (ref 13.0–17.0)
Immature Granulocytes: 1 %
Lymphocytes Relative: 8 %
Lymphs Abs: 0.7 10*3/uL (ref 0.7–4.0)
MCH: 31.1 pg (ref 26.0–34.0)
MCHC: 31.6 g/dL (ref 30.0–36.0)
MCV: 98.6 fL (ref 80.0–100.0)
Monocytes Absolute: 0.8 10*3/uL (ref 0.1–1.0)
Monocytes Relative: 9 %
Neutro Abs: 6.8 10*3/uL (ref 1.7–7.7)
Neutrophils Relative %: 81 %
Platelets: 76 10*3/uL — ABNORMAL LOW (ref 150–400)
RBC: 4.24 MIL/uL (ref 4.22–5.81)
RDW: 15.9 % — ABNORMAL HIGH (ref 11.5–15.5)
WBC: 8.4 10*3/uL (ref 4.0–10.5)
nRBC: 0.5 % — ABNORMAL HIGH (ref 0.0–0.2)

## 2022-12-13 LAB — CULTURE, BLOOD (ROUTINE X 2)

## 2022-12-13 LAB — TECHNOLOGIST SMEAR REVIEW: Plt Morphology: DECREASED

## 2022-12-13 LAB — HEPARIN LEVEL (UNFRACTIONATED): Heparin Unfractionated: 0.34 IU/mL (ref 0.30–0.70)

## 2022-12-13 LAB — C-REACTIVE PROTEIN: CRP: 4.9 mg/dL — ABNORMAL HIGH (ref <1.0)

## 2022-12-13 LAB — MAGNESIUM: Magnesium: 2.2 mg/dL (ref 1.7–2.4)

## 2022-12-13 LAB — PROCALCITONIN: Procalcitonin: 0.2 ng/mL

## 2022-12-13 MED ORDER — LACTATED RINGERS IV SOLN: INTRAVENOUS | Status: DC

## 2022-12-13 MED ORDER — METOPROLOL TARTRATE 50 MG PO TABS
50.0000 mg | ORAL_TABLET | Freq: Two times a day (BID) | ORAL | Status: DC
  Administered 2022-12-13: 50 mg via ORAL
  Filled 2022-12-13 (×2): qty 1

## 2022-12-13 MED ORDER — POTASSIUM CHLORIDE 10 MEQ/100ML IV SOLN
10.0000 meq | INTRAVENOUS | Status: AC
  Administered 2022-12-13 (×2): 10 meq via INTRAVENOUS
  Filled 2022-12-13: qty 100

## 2022-12-13 MED ORDER — SODIUM CHLORIDE 0.9 % IV SOLN
3.0000 g | Freq: Three times a day (TID) | INTRAVENOUS | Status: DC
  Administered 2022-12-13 – 2022-12-15 (×6): 3 g via INTRAVENOUS
  Filled 2022-12-13 (×6): qty 8

## 2022-12-13 MED ORDER — POTASSIUM CHLORIDE 10 MEQ/100ML IV SOLN
INTRAVENOUS | Status: AC
  Filled 2022-12-13: qty 100

## 2022-12-13 MED ORDER — METOPROLOL TARTRATE 5 MG/5ML IV SOLN: 5.0000 mg | Freq: Three times a day (TID) | INTRAVENOUS | Status: DC | PRN

## 2022-12-13 MED ORDER — APIXABAN 2.5 MG PO TABS
2.5000 mg | ORAL_TABLET | Freq: Two times a day (BID) | ORAL | Status: DC
  Administered 2022-12-13 – 2022-12-14 (×2): 2.5 mg via ORAL
  Filled 2022-12-13 (×2): qty 1

## 2022-12-13 NOTE — Care Management Important Message (Signed)
Important Message  Patient Details  Name: [name] MRN: [id number] Date of Birth: 1935-11-08   Medicare Important Message Given:  Yes     Sherilyn Banker 12/13/2022, 3:27 PM

## 2022-12-13 NOTE — Plan of Care (Signed)
  Problem: Safety: Goal: Non-violent Restraint(s) Outcome: Progressing   Problem: Education: Goal: Ability to describe self-care measures that may prevent or decrease complications (Diabetes Survival Skills Education) will improve Outcome: Progressing Goal: Individualized Educational Video(s) Outcome: Progressing   Problem: Coping: Goal: Ability to adjust to condition or change in health will improve Outcome: Progressing   Problem: Fluid Volume: Goal: Ability to maintain a balanced intake and output will improve Outcome: Progressing   Problem: Health Behavior/Discharge Planning: Goal: Ability to identify and utilize available resources and services will improve Outcome: Progressing Goal: Ability to manage health-related needs will improve Outcome: Progressing   Problem: Metabolic: Goal: Ability to maintain appropriate glucose levels will improve Outcome: Progressing   Problem: Nutritional: Goal: Maintenance of adequate nutrition will improve Outcome: Progressing Goal: Progress toward achieving an optimal weight will improve Outcome: Progressing   Problem: Skin Integrity: Goal: Risk for impaired skin integrity will decrease Outcome: Progressing   Problem: Tissue Perfusion: Goal: Adequacy of tissue perfusion will improve Outcome: Progressing   Problem: Education: Goal: Knowledge of General Education information will improve Description: Including pain rating scale, medication(s)/side effects and non-pharmacologic comfort measures Outcome: Progressing   Problem: Health Behavior/Discharge Planning: Goal: Ability to manage health-related needs will improve Outcome: Progressing   Problem: Clinical Measurements: Goal: Ability to maintain clinical measurements within normal limits will improve Outcome: Progressing Goal: Will remain free from infection Outcome: Progressing Goal: Diagnostic test results will improve Outcome: Progressing Goal: Respiratory complications  will improve Outcome: Progressing Goal: Cardiovascular complication will be avoided Outcome: Progressing   Problem: Activity: Goal: Risk for activity intolerance will decrease Outcome: Progressing   Problem: Nutrition: Goal: Adequate nutrition will be maintained Outcome: Progressing   Problem: Coping: Goal: Level of anxiety will decrease Outcome: Progressing   Problem: Elimination: Goal: Will not experience complications related to bowel motility Outcome: Progressing Goal: Will not experience complications related to urinary retention Outcome: Progressing   Problem: Pain Managment: Goal: General experience of comfort will improve Outcome: Progressing   Problem: Safety: Goal: Ability to remain free from injury will improve Outcome: Progressing   Problem: Skin Integrity: Goal: Risk for impaired skin integrity will decrease Outcome: Progressing   

## 2022-12-13 NOTE — Progress Notes (Signed)
PROGRESS NOTE    [name]  XBJ:[id number] DOB: 1935-06-11 DOA: 12/31/2022 PCP: Scott Inch, MD   Brief Narrative: [name] is a 87 y.o. male with a history of nonischemic cardiomyopathy, chronic heart failure with reduced EF, permanent atrial fibrillation on Eliquis, history of biventricular ICD placement, hypertension, hyperlipidemia, COPD.  Patient presented secondary to being found down.  On arrival to the emergency department, patient was found to have atrial fibrillation with rapid ventricular response, tachypnea, elevated temperature with concern for sepsis.  Workup revealed evidence of right upper lobe pneumonia in addition to evidence of bilateral peripheral PE.  Labs are significant for hyponatremia and AKI consistent with significant dehydration.  Patient started on empiric antibiotics in addition to anticoagulation.  Hospitalization complicated by development of hematuria and persistent atrial fibrillation with RVR and hypotension.   Assessment/Plan:  Severe dehydration from falling down at home, inability to eat or drink for several days with AKI and hypernatremia.  Hydrate, switch fluids to D5W, PT OT, still has encephalopathy and unable to cooperate in exam or interview, prognosis still guarded, continue to monitor.  De-escalate antibiotics, UA stable, possible aspiration pneumonia for which she will be placed on Unasyn on 12/12/2022, 1 out of 4 blood cultures likely contamination.  Follow final cultures.  PT OT and speech once he is able to cooperate.  Sepsis pathophysiology present upon admission has resolved.  Right upper lobe pneumonia See above  Acute metabolic encephalopathy Likely secondary to acute illness and likely contributed to by dehydration and hypernatremia.  CT head and C-spine nonacute, no focal deficits or headache.  Monitor.  Acute pulmonary embolism Noted on admission on CT angio of the chest.  Peripheral bilateral pulmonary emboli with  low clot burden and unlikely right heart strain.  Patient is on Eliquis as an outpatient for atrial fibrillation.  Patient transition to heparin IV heparin.  Echo noted with reduction in RV systolic function.  Monitor.  Acute respiratory failure with hypoxia Secondary to evidence of pneumonia on imaging.  Patient was placed on nonrebreather mask for SpO2 in the 70s.  Now weaned off supplemental oxygen.  Respiratory failure appears to be resolved.  Permanent atrial fibrillation with RVR Patient is on metoprolol, Eliquis and digoxin as an outpatient.  Patient with RVR this admission in the setting of severe dehydration and acute PE.  Cardiology following on amiodarone drip.  Elevated AST/ALT, Hyperbilirubinemia CT abdomen/pelvis without etiology for elevations.  Shock liver due to hypotension improving.  AKI Baseline creatinine of about 1-1.1 from outside records.  Extremely dehydrated, has Foley catheter continue to hydrate and monitor.  No obstruction on CT abdomen pelvis.  Elevated troponin Troponin of 103 on admission with delta of 85.  No chest pain.  Likely demand ischemia related to fibrillation with RVR.  Chronic heart failure with reduced EF LVEF listed as 40-45%.  Patient is status post biventricular ICD placement.  ENP elevated due to combination of chronic left and now right heart strain.  Clinically dehydrated.  Blood pressure too low for beta-blocker, ACE/ARB medications at this point.  Fall Patient found down. CK total of 333. -PT/OT evaluation  Diabetes mellitus type 2 Patient with a history of diabetes mellitus.  Currently controlled with hemoglobin A1c of 5.6%.  Patient is not on medication management as an outpatient. -Carb modified diet once able to take p.o.  Hyperlipidemia Patient is on Lipitor as an outpatient which was held on admission secondary to n.p.o. status.  Thrombocytopenia - check smear,  some due to dilution  QTc prolongation Noted on EKG.  Complicated  by bundle branch block.  Patient is now on amiodarone. -On telemetry defer to cardiology.   DVT prophylaxis: Heparin IV (held) Code Status:   Code Status: DNR Family Communication: Faylene Kurtz in detail phone #[phone] on 12/12/2022 >> DNR, no feeding tubes, updated 12/13/23 Disposition Plan: SNF   Consultants:  Cardiology, Pall Care  Procedures:   CT chest abdomen pelvis.   1. Positive examination for peripheral lower lobe pulmonary emboli. No significant central pulmonary embolus. Low clot burden indicates right heart strain would be unlikely. 2. Airspace disease in the right upper lung is likely pneumonia. 3. Diffuse emphysematous changes in the lungs. Chronic bronchitic changes. 4. Aortic atherosclerosis. 5. Heterogeneous striated nephrograms bilaterally likely indicate pyelonephritis. 6. Prostate gland is enlarged.  Echocardiogram.  1. Left ventricular ejection fraction, by estimation, is 50 to 55%. The left ventricle has low normal function. Left ventricular endocardial border not optimally defined to evaluate regional wall motion. Left ventricular diastolic function could not be evaluated.  2. Right ventricular systolic function is moderately reduced. The right ventricular size is normal.  3. Right atrial size was severely dilated.  4. The mitral valve is normal in structure. Trivial mitral valve regurgitation. No evidence of mitral stenosis.  5. The aortic valve is normal in structure. Aortic valve regurgitation is not visualized. No aortic stenosis is present.  6. The inferior vena cava is dilated in size with <50% respiratory variability, suggesting right atrial pressure of 15 mmHg. Conclusion(s)/Recommendation(s): Technically very limited study with poor sound wave transmission. LV never seen well but EF appears preserved on apica images. RV is moderately hypokientic. There is a pcing wire evident in the coronary sinus.    CT head and C-spine.  Nonacute.  Antimicrobials: Unasyn  started 12/12/2022   Subjective:  Patient in bed quite sleepy, arousable, denies any headache but not a heritable historian, appears to be in no discomfort.  Objective: BP 122/73   Pulse 100   Temp 98 F (36.7 C) (Oral)   Resp 20   Ht 6\' 3"  (1.905 m)   Wt 76.2 kg   SpO2 100%   BMI 21.00 kg/m   Examination:  Minimally responsive in bed, unable to answer questions reliably, moving all 4 remedies by himself, Foley catheter in place [place].AT,PERRAL Supple Neck, No JVD,   Symmetrical Chest wall movement, Good air movement bilaterally, CTAB RRR,No Gallops, Rubs or new Murmurs,  +ve B.Sounds, Abd Soft, No tenderness,   No Cyanosis, Clubbing or edema     Data Reviewed: I have personally reviewed following labs and imaging studies  Recent Labs  Lab 12-24-2022 1928 2022/12/24 1938 12/11/22 0344 12/11/22 0823 12/11/22 0830 12/11/22 1918 12/12/22 0611 12/13/22 0752  WBC 9.7  --  11.3* 11.7*  --   --  8.2 8.4  HGB 14.1   < > 14.4 14.5 15.3 15.5 13.7 13.2  HCT 44.2   < > 46.5 47.8 45.0 50.0 43.7 41.8  PLT 179  --  145* 155  --   --  116* 76*  MCV 98.7  --  102.6* 102.1*  --   --  100.0 98.6  MCH 31.5  --  31.8 31.0  --   --  31.4 31.1  MCHC 31.9  --  31.0 30.3  --   --  31.4 31.6  RDW 16.1*  --  16.3* 16.2*  --   --  15.9* 15.9*  LYMPHSABS  --   --   --   --   --   --   --  0.7  MONOABS  --   --   --   --   --   --   --  0.8  EOSABS  --   --   --   --   --   --   --  0.1  BASOSABS  --   --   --   --   --   --   --  0.0   < > = values in this interval not displayed.    Recent Labs  Lab 12/27/2022 1928 12-27-2022 1938 12-27-22 2215 12/11/22 0017 12/11/22 0344 12/11/22 0710 12/11/22 0823 12/11/22 0830 12/11/22 1238 12/11/22 1918 12/12/22 0611 12/13/22 0752  NA 156* 155*  155*  --   --  150*  --  152* 152*  --  150* 150* 146*  K 4.6 5.0  5.0  --   --  4.1  --  4.3 4.2  --   --  3.7 3.6  CL 118* 119*  --   --  114*  --  118*  --   --   --  114* 114*  CO2 21*  --   --    --  22  --  21*  --   --   --  23 21*  ANIONGAP 17*  --   --   --  14  --  13  --   --   --  13 11  GLUCOSE 134* 132*  --   --  167*  --  165*  --   --   --  163* 147*  BUN 44* 57*  --   --  44*  --  44*  --   --   --  48* 39*  CREATININE 2.15* 2.10*  --   --  1.85*  --  2.04*  --   --   --  1.92* 1.52*  AST 327*  --   --   --  333*  --  376*  --   --   --  165* 149*  ALT 173*  --   --   --  196*  --  231*  --   --   --  187* 172*  ALKPHOS 109  --   --   --  120  --  121  --   --   --  99 95  BILITOT 3.9*  --   --   --  3.3*  --  3.1*  --   --   --  2.0* 1.5*  ALBUMIN 3.0*  --   --   --  2.8*  --  2.9*  --   --   --  2.6* 2.2*  CRP  --   --   --   --   --   --   --   --   --   --  7.2*  --   PROCALCITON  --   --   --   --   --   --   --   --   --   --  0.18  --   LATICACIDVEN  --   --  4.1* 3.5*  --  4.5*  --   --  2.6*  --   --   --   INR 1.7*  --   --   --   --   --   --   --   --   --   --   --  HGBA1C  --   --   --   --  5.6  --   --   --   --   --   --   --   AMMONIA  --   --   --   --   --   --   --   --   --  20 52*  --   BNP 1,420.9*  --   --   --   --   --   --   --   --   --  911.9*  --   MG 2.2  --   --   --   --   --   --   --   --   --  1.8 2.2  CALCIUM 8.8*  --   --   --  8.6*  --  8.6*  --   --   --  8.4* 7.9*    Recent Labs  Lab 12/05/2022 1928 12/14/2022 2215 12/11/22 0017 12/11/22 0344 12/11/22 0710 12/11/22 0823 12/11/22 1238 12/11/22 1918 12/12/22 0611 12/13/22 0752  CRP  --   --   --   --   --   --   --   --  7.2*  --   PROCALCITON  --   --   --   --   --   --   --   --  0.18  --   LATICACIDVEN  --  4.1* 3.5*  --  4.5*  --  2.6*  --   --   --   INR 1.7*  --   --   --   --   --   --   --   --   --   HGBA1C  --   --   --  5.6  --   --   --   --   --   --   AMMONIA  --   --   --   --   --   --   --  20 52*  --   BNP 1,420.9*  --   --   --   --   --   --   --  911.9*  --   MG 2.2  --   --   --   --   --   --   --  1.8 2.2  CALCIUM 8.8*  --   --  8.6*  --  8.6*   --   --  8.4* 7.9*     Recent Results (from the past 240 hour(s))  Resp panel by RT-PCR (RSV, Flu A&B, Covid) Anterior Nasal Swab     Status: None   Collection Time: 12/08/2022  7:30 PM   Specimen: Anterior Nasal Swab  Result Value Ref Range Status   SARS Coronavirus 2 by RT PCR NEGATIVE NEGATIVE Final   Influenza A by PCR NEGATIVE NEGATIVE Final   Influenza B by PCR NEGATIVE NEGATIVE Final    Comment: (NOTE) The Xpert Xpress SARS-CoV-2/FLU/RSV plus assay is intended as an aid in the diagnosis of influenza from Nasopharyngeal swab specimens and should not be used as a sole basis for treatment. Nasal washings and aspirates are unacceptable for Xpert Xpress SARS-CoV-2/FLU/RSV testing.  Fact Sheet for Patients: BloggerCourse.com  Fact Sheet for Healthcare Providers: SeriousBroker.it  This test is not yet approved or cleared by the [place] FDA and has been authorized for detection and/or diagnosis of  SARS-CoV-2 by FDA under an Emergency Use Authorization (EUA). This EUA will remain in effect (meaning this test can be used) for the duration of the COVID-19 declaration under Section 564(b)(1) of the Act, 21 U.S.C. section 360bbb-3(b)(1), unless the authorization is terminated or revoked.     Resp Syncytial Virus by PCR NEGATIVE NEGATIVE Final    Comment: (NOTE) Fact Sheet for Patients: BloggerCourse.com  Fact Sheet for Healthcare Providers: SeriousBroker.it  This test is not yet approved or cleared by the [place] FDA and has been authorized for detection and/or diagnosis of SARS-CoV-2 by FDA under an Emergency Use Authorization (EUA). This EUA will remain in effect (meaning this test can be used) for the duration of the COVID-19 declaration under Section 564(b)(1) of the Act, 21 U.S.C. section 360bbb-3(b)(1), unless the authorization is terminated  or revoked.  Performed at [facility], [address], [place], [place] [address]   Culture, blood (Routine X 2) w Reflex to ID Panel     Status: None (Preliminary result)   Collection Time: 12/11/22  6:25 AM   Specimen: BLOOD  Result Value Ref Range Status   Specimen Description BLOOD BLOOD LEFT FOREARM  Final   Special Requests   Final    BOTTLES DRAWN AEROBIC AND ANAEROBIC Blood Culture results may not be optimal due to an inadequate volume of blood received in culture bottles   Culture  Setup Time   Final    GRAM POSITIVE COCCI AEROBIC BOTTLE ONLY CRITICAL RESULT CALLED TO, READ BACK BY AND VERIFIED WITH: PHARMD EMILY S. [id number] FCP Performed at [facility], [address] [place], [place] [address]    Culture GRAM POSITIVE COCCI  Final   Report Status PENDING  Incomplete  Blood Culture ID Panel (Reflexed)     Status: Abnormal   Collection Time: 12/11/22  6:25 AM  Result Value Ref Range Status   Enterococcus faecalis NOT DETECTED NOT DETECTED Final   Enterococcus Faecium NOT DETECTED NOT DETECTED Final   Listeria monocytogenes NOT DETECTED NOT DETECTED Final   Staphylococcus species NOT DETECTED NOT DETECTED Final   Staphylococcus aureus (BCID) NOT DETECTED NOT DETECTED Final   Staphylococcus epidermidis NOT DETECTED NOT DETECTED Final   Staphylococcus lugdunensis NOT DETECTED NOT DETECTED Final   Streptococcus species DETECTED (A) NOT DETECTED Final    Comment: Not Enterococcus species, Streptococcus agalactiae, Streptococcus pyogenes, or Streptococcus pneumoniae. CRITICAL RESULT CALLED TO, READ BACK BY AND VERIFIED WITH: PHARMD EMILY S. [id number] FCP    Streptococcus agalactiae NOT DETECTED NOT DETECTED Final   Streptococcus pneumoniae NOT DETECTED NOT DETECTED Final   Streptococcus pyogenes NOT DETECTED NOT DETECTED Final   A.calcoaceticus-baumannii NOT DETECTED NOT DETECTED Final   Bacteroides fragilis NOT DETECTED NOT DETECTED Final    Enterobacterales NOT DETECTED NOT DETECTED Final   Enterobacter cloacae complex NOT DETECTED NOT DETECTED Final   Escherichia coli NOT DETECTED NOT DETECTED Final   Klebsiella aerogenes NOT DETECTED NOT DETECTED Final   Klebsiella oxytoca NOT DETECTED NOT DETECTED Final   Klebsiella pneumoniae NOT DETECTED NOT DETECTED Final   Proteus species NOT DETECTED NOT DETECTED Final   Salmonella species NOT DETECTED NOT DETECTED Final   Serratia marcescens NOT DETECTED NOT DETECTED Final   Haemophilus influenzae NOT DETECTED NOT DETECTED Final   Neisseria meningitidis NOT DETECTED NOT DETECTED Final   Pseudomonas aeruginosa NOT DETECTED NOT DETECTED Final   Stenotrophomonas maltophilia NOT DETECTED NOT DETECTED Final   Candida albicans NOT DETECTED NOT  DETECTED Final   Candida auris NOT DETECTED NOT DETECTED Final   Candida glabrata NOT DETECTED NOT DETECTED Final   Candida krusei NOT DETECTED NOT DETECTED Final   Candida parapsilosis NOT DETECTED NOT DETECTED Final   Candida tropicalis NOT DETECTED NOT DETECTED Final   Cryptococcus neoformans/gattii NOT DETECTED NOT DETECTED Final    Comment: Performed at [facility], [address] [place], [place] [address]  Culture, blood (Routine X 2) w Reflex to ID Panel     Status: None (Preliminary result)   Collection Time: 12/11/22  6:30 AM   Specimen: BLOOD LEFT HAND  Result Value Ref Range Status   Specimen Description BLOOD LEFT HAND  Final   Special Requests   Final    BOTTLES DRAWN AEROBIC AND ANAEROBIC Blood Culture adequate volume   Culture   Final    NO GROWTH 2 DAYS Performed at [facility], [address], [place], [place] [address]    Report Status PENDING  Incomplete  MRSA Next Gen by PCR, Nasal     Status: None   Collection Time: 12/11/22  8:40 AM   Specimen: Nasal Mucosa; Nasal Swab  Result Value Ref Range Status   MRSA by PCR Next Gen NOT DETECTED NOT DETECTED Final    Comment: (NOTE) The GeneXpert MRSA Assay (FDA  approved for NASAL specimens only), is one component of a comprehensive MRSA colonization surveillance program. It is not intended to diagnose MRSA infection nor to guide or monitor treatment for MRSA infections. Test performance is not FDA approved in patients less than 41 years old. Performed at [facility], [address] [place], [place] [address]       Radiology Studies: DG Chest Port 1 View  Result Date: 12/12/2022 CLINICAL DATA:  PICC line placement EXAM: PORTABLE CHEST 1 VIEW COMPARISON:  12/12/2022 at 6:12 a.m. FINDINGS: A new right upper extremity PICC line is observed, tip projecting over the SVC. No pneumothorax or complicating feature. AICD remains in place. Atherosclerotic calcification of the aortic arch. Increased retrocardiac airspace opacity suspicious for left lower lobe atelectasis or pneumonia. Hazy density in the right infrahilar region could also reflect early pneumonia or edema. Borderline enlargement of the cardiopericardial silhouette. Mildly indistinct pulmonary vasculature. The faint right upper lobe opacity seen on the exam earlier today is less conspicuous. Degenerative glenohumeral spurring bilaterally. IMPRESSION: 1. Right upper extremity PICC line tip projects over the SVC. 2. Increased retrocardiac airspace opacity suspicious for left lower lobe atelectasis or pneumonia. 3. Hazy density in the right infrahilar region could also reflect early pneumonia or edema. 4. Borderline enlargement of the cardiopericardial silhouette. Electronically Signed   By: Gaylyn Rong M.D.   On: 12/12/2022 17:54   [place] EKG SITE RITE  Result Date: 12/12/2022 If Site Rite image not attached, placement could not be confirmed due to current cardiac rhythm.  DG Chest Port 1 View  Result Date: 12/12/2022 CLINICAL DATA:  Shortness of breath EXAM: PORTABLE CHEST 1 VIEW COMPARISON:  12/11/2022 FINDINGS: Biventricular ICD/pacer leads in stable position. Stable cardiac enlargement.  Unremarkable mediastinal contours. Opacity in the right upper lobe which is an infiltrate based on recent chest CT. No effusion or pneumothorax. IMPRESSION: Mild right upper lobe infiltrate without progression. Electronically Signed   By: Tiburcio Pea M.D.   On: 12/12/2022 07:10   ECHOCARDIOGRAM COMPLETE  Result Date: 12/11/2022    ECHOCARDIOGRAM REPORT   Patient Name:   [name] Date of Exam: 12/11/2022  Medical Rec #:  [id number]        Height:       75.0 in Accession #:    [id number]       Weight:       170.0 lb Date of Birth:  1935-12-10        BSA:          2.048 m Patient Age:    86 years         BP:           101/83 mmHg Patient Gender: M                HR:           104 bpm. Exam Location:  Inpatient Procedure: 2D Echo, Color Doppler and Cardiac Doppler Indications:    Elevated troponin  History:        Patient has prior history of Echocardiogram examinations, most                 recent 03/04/2022. Cardiomyopathy and HFrEF, CAD, Defibrillator,                 COPD, Arrythmias:Atrial Fibrillation, Signs/Symptoms:Altered                 Mental Status; Risk Factors:Hypertension and Dyslipidemia.  Sonographer:    Milda Smart Referring Phys: [id number] CAROLE N HALL  Sonographer Comments: Image acquisition challenging due to patient body habitus, Image acquisition challenging due to uncooperative patient and Image acquisition challenging due to respiratory motion. Pt could not be optimally positioned. IMPRESSIONS  1. Left ventricular ejection fraction, by estimation, is 50 to 55%. The left ventricle has low normal function. Left ventricular endocardial border not optimally defined to evaluate regional wall motion. Left ventricular diastolic function could not be evaluated.  2. Right ventricular systolic function is moderately reduced. The right ventricular size is normal.  3. Right atrial size was severely dilated.  4. The mitral valve is normal in structure. Trivial mitral valve regurgitation. No  evidence of mitral stenosis.  5. The aortic valve is normal in structure. Aortic valve regurgitation is not visualized. No aortic stenosis is present.  6. The inferior vena cava is dilated in size with <50% respiratory variability, suggesting right atrial pressure of 15 mmHg. Conclusion(s)/Recommendation(s): Technically very limited study with poor sound wave transmission. LV never seen well but EF appears preserved on apica images. RV is moderately hypokientic. There is a pcing wire evident in the coronary sinus. FINDINGS  Left Ventricle: Left ventricular ejection fraction, by estimation, is 50 to 55%. The left ventricle has low normal function. Left ventricular endocardial border not optimally defined to evaluate regional wall motion. The left ventricular internal cavity  size was normal in size. There is no left ventricular hypertrophy. Left ventricular diastolic function could not be evaluated due to atrial fibrillation. Left ventricular diastolic function could not be evaluated. Right Ventricle: The right ventricular size is normal. No increase in right ventricular wall thickness. Right ventricular systolic function is moderately reduced. Left Atrium: Left atrial size was normal in size. Right Atrium: Right atrial size was severely dilated. Pericardium: There is no evidence of pericardial effusion. Mitral Valve: The mitral valve is normal in structure. Mild mitral annular calcification. Trivial mitral valve regurgitation. No evidence of mitral valve stenosis. Tricuspid Valve: The tricuspid valve is not well visualized. Tricuspid valve regurgitation is mild . No evidence of tricuspid stenosis. Aortic Valve: The aortic valve is normal in structure. Aortic valve regurgitation  is not visualized. No aortic stenosis is present. Pulmonic Valve: The pulmonic valve was normal in structure. Pulmonic valve regurgitation is not visualized. No evidence of pulmonic stenosis. Aorta: The aortic root is normal in size and  structure. Venous: The inferior vena cava is dilated in size with less than 50% respiratory variability, suggesting right atrial pressure of 15 mmHg. IAS/Shunts: No atrial level shunt detected by color flow Doppler. Additional Comments: A device lead is visualized.  LEFT ATRIUM           Index        RIGHT ATRIUM           Index LA Vol (A4C): 52.5 ml 25.64 ml/m  RA Area:     39.40 cm                                    RA Volume:   163.00 ml 79.59 ml/m Arvilla Meres MD Electronically signed by Arvilla Meres MD Signature Date/Time: 12/11/2022/1:49:21 PM    Final       LOS: 2 days   Signature  -    Susa Raring M.D on 12/13/2022 at 10:56 AM   -  To page go to [email or web address]

## 2022-12-13 NOTE — Consult Note (Signed)
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  Patient not in his room at time of rounding or later. The above organism can be associated with endocarditis and I have requested TEE (planned for Tuesday) Continue antibiotics Will follow

## 2022-12-13 NOTE — Progress Notes (Addendum)
Modified Barium Swallow Study  Patient Details  Name: [name] MRN: [id number] Date of Birth: 25-Aug-1935  Today's Date: 12/13/2022  Modified Barium Swallow completed.  Full report located under Chart Review in the Imaging Section.  History of Present Illness [name] is a 87 y.o. male with medical history significant for nonischemic cardiomyopathy, chronic HFrEF, permanent atrial fibrillation, biventricular ICD placement, hypertension, hyperlipidemia, COPD, who presents via EMS after being found down by neighbors after 2 days. Found to be hypoxic with O2 saturation of 85% on room air. Code sepsis was called in the ED. Acute pulmonary embolism seen on CT angio chest. Noncontrast head CT was nonacute.  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 Solid was administered but not recorded. Pt demonstrated mild oropharyngeal dysphagia with slow lingual motion/transit and trace lingual residue. Pharyngeal phase marked by delayed swallow initiation, reduced tongue base retraction and decreased laryngeal vestibular closure. Trace penetration of thin with minimal amount remained and flash penetration of nectar thick. Reduced tongue base retraction led to mild-max vallecular residue mainly with puree. He had difficulty initiating a second swallow. Esophagus scanned revealing maximum retention of barium to proximal esophagus. Esophagus viewed approximately 5 minutes after study when cracker given and esophagus noted to be mostly clear. Prolonged mastication with solid. Recommend pt initiate Dys 2 texture, nectar thick liquids, multiple swallows, alternate liquids and solids and remain upright after meals. Factors that may increase risk of adverse event in presence of aspiration Rubye Oaks &  Clearance Coots 2021): Frail or deconditioned  Swallow Evaluation Recommendations Recommendations: PO diet PO Diet Recommendation: Dysphagia 2 (Finely chopped);Mildly thick liquids (Level 2, nectar thick) Liquid Administration via: Straw;Cup Medication Administration: Crushed with puree Supervision: Staff to assist with self-feeding Swallowing strategies  : Slow rate;Small bites/sips;Multiple dry swallows after each bite/sip;Follow solids with liquids Postural changes: Stay upright 30-60 min after meals;Position pt fully upright for meals Oral care recommendations: Oral care BID (2x/day)      Royce Macadamia 12/13/2022,4:37 PM

## 2022-12-13 NOTE — Evaluation (Signed)
Occupational Therapy Evaluation Patient Details Name: [name] MRN: [id number] DOB: August 08, 1935 Today's Date: 12/13/2022   History of Present Illness 87 yo male admitted 7/16 after found down with metabolic encephalopathy and PE. PMHx: NICM, CHF, Afib, ICD, HTN, HLD, COPD, DM   Clinical Impression   At baseline, pt completes ADLs and functional mobility/transfers without an AD Independent to Mod I and performs light meal prep with microwave. Pt now presents with decreased cognition, generalized B UE weakness, edematous L UE with weeping, decreased B UE fine and gross motor coordination, and decreased safety and independence with ADLs, bed mobility during/in preparation for functional tasks, and functional transfers/mobility. Pt currently demonstrates ability to complete UB ADLs with Max assist, LB ADLs with Total assist +2, and rolling in the bed with Max assist. Pt currently requiring cues for initiation, sequencing, technique, and maintaining attention throughout functional tasks. Pt will benefit from acute skilled OT services to address deficits outlined below and increase safety and independence with ADLs and functional transfers. Post acute discharge, pt will benefit from intensive inpatient skilled rehab services < 3 hours per day to maximize rehab potential.      Recommendations for follow up therapy are one component of a multi-disciplinary discharge planning process, led by the attending physician.  Recommendations may be updated based on patient status, additional functional criteria and insurance authorization.   Assistance Recommended at Discharge Frequent or constant Supervision/Assistance  Patient can return home with the following Two people to help with walking and/or transfers;Two people to help with bathing/dressing/bathroom;Assistance with cooking/housework;Assistance with feeding;Direct supervision/assist for medications management;Direct supervision/assist for financial  management;Assist for transportation;Help with stairs or ramp for entrance    Functional Status Assessment  Patient has had a recent decline in their functional status and demonstrates the ability to make significant improvements in function in a reasonable and predictable amount of time.  Equipment Recommendations  Other (comment) (Defer to next level of care)    Recommendations for Other Services       Precautions / Restrictions Precautions Precautions: Fall;Other (comment) Precaution Comments: watch sats, edematous weeping LUE Restrictions Weight Bearing Restrictions: No      Mobility Bed Mobility Overal bed mobility: Needs Assistance Bed Mobility: Rolling Rolling: Max assist         General bed mobility comments: Supine <-> sit transfer deferred this session for pt/therapist safety. Pt demonstrated no initiation with rolling L/R but demonstrating minimal attempt to asssit with rolling once therapist initiated movement.    Transfers                   General transfer comment: Pt unable      Balance       Sitting balance - Comments: Sitting EOB deferred this session for pt/therapist safety secondary to pt with minimal ability to assist with rolling in the bed,                                   ADL either performed or assessed with clinical judgement   ADL Overall ADL's : Needs assistance/impaired Eating/Feeding: NPO   Grooming: Wash/dry face;Maximal assistance;Cueing for sequencing;Bed level (cues for initiation)   Upper Body Bathing: Maximal assistance;Bed level;Cueing for sequencing;Cueing for compensatory techniques (cues for initiation)   Lower Body Bathing: Total assistance;+2 for physical assistance;Bed level   Upper Body Dressing : Maximal assistance;Cueing for sequencing;Cueing for compensatory techniques;Bed level (cues for initiation)   Lower  Body Dressing: Total assistance;+2 for physical assistance;Bed level     Toilet  Transfer Details (indicate cue type and reason): unable Toileting- Clothing Manipulation and Hygiene: Total assistance;+2 for physical assistance;Bed level         General ADL Comments: Pt requiring cues to initiate and maintain participation in funcitonal tasks from bed level this session.     Vision Baseline Vision/History: 1 Wears glasses (Readers) Ability to See in Adequate Light: 0 Adequate (with glasses) Patient Visual Report: No change from baseline       Perception     Praxis      Pertinent Vitals/Pain Pain Assessment Pain Assessment: No/denies pain Pain Intervention(s): Monitored during session     Hand Dominance Right   Extremity/Trunk Assessment Upper Extremity Assessment Upper Extremity Assessment: Generalized weakness;LUE deficits/detail;RUE deficits/detail;Difficult to assess due to impaired cognition RUE Deficits / Details: Generalized weakness with pt unable to consistantly follow 1 step commands for AROM or MMT. Pt with resistance with PROM and unable to follow instruction to relax UE. RUE Coordination: decreased fine motor;decreased gross motor (Difficult to fully assess secondary to cognition) LUE Deficits / Details: Edema with weeping in elbow and forearm. Generalized weakness with pt unable to consistantly follow 1 step commands for AROM or MMT. Pt with resistance with PROM and unable to follow instruction to relax UE. LUE Coordination: decreased fine motor;decreased gross motor (Difficult to fully assess secondary to cognition)   Lower Extremity Assessment Lower Extremity Assessment: Defer to PT evaluation   Cervical / Trunk Assessment Cervical / Trunk Assessment: Normal   Communication Communication Communication: HOH;Receptive difficulties;Expressive difficulties (Has hearing aids in room. Pt requires significantly increased time for processing and word finding.)   Cognition Arousal/Alertness: Awake/alert, Lethargic (Awake but lethargic throughout  session) Behavior During Therapy: Flat affect Overall Cognitive Status: Impaired/Different from baseline Area of Impairment: Orientation, Attention, Memory, Following commands, Safety/judgement, Awareness, Problem solving                 Orientation Level: Disoriented to, Time, Situation Current Attention Level: Focused Memory: Decreased short-term memory Following Commands: Follows one step commands inconsistently, Follows one step commands with increased time Safety/Judgement: Decreased awareness of safety, Decreased awareness of deficits Awareness: Intellectual Problem Solving: Slow processing, Decreased initiation, Difficulty sequencing, Requires verbal cues, Requires tactile cues       General Comments  Pt with HR increasing into the 110s with minimal participation in B UE evaluation, bed mobility, and functional tasks from bed level. All other VSS on 5L continuous O2 through nasal cannula throughout session. Pt pleasant throughout session.    Exercises     Shoulder Instructions      Home Living Family/patient expects to be discharged to:: Private residence Living Arrangements: Alone Available Help at Discharge: Other (Comment) (Pt reports occasional assistance from neighbor.) Type of Home: House Home Access: Level entry     Home Layout: One level     Bathroom Shower/Tub: Chief Strategy Officer: Standard     Home Equipment: None          Prior Functioning/Environment Prior Level of Function : Independent/Modified Independent;Driving             Mobility Comments: Pt reports he was previously Independent with funcitonal mobility without an AD. ADLs Comments: Pt reports he previously completed ADLs and light meal prep with microwave Independent to Mod I. Pt reports he washes up sitting at the sink at baseline.        OT Problem List:  Decreased strength;Decreased activity tolerance;Decreased coordination;Decreased cognition;Impaired UE  functional use      OT Treatment/Interventions: Self-care/ADL training;Therapeutic exercise;DME and/or AE instruction;Therapeutic activities;Cognitive remediation/compensation;Patient/family education;Balance training    OT Goals(Current goals can be found in the care plan section) Acute Rehab OT Goals Patient Stated Goal: Pt wants to be able to drink water (pt currently NPO) OT Goal Formulation: Patient unable to participate in goal setting Time For Goal Achievement: 12/27/22 Potential to Achieve Goals: Fair ADL Goals Pt Will Perform Grooming: with min assist;sitting Pt Will Perform Upper Body Dressing: with min assist;sitting Pt Will Transfer to Toilet: with mod assist;stand pivot transfer;bedside commode (with least restrictive AD) Additional ADL Goal #1: Patient will sit EOB for 5 minutes during functional or therapeutic task while demonstrating Fair balance and Selective attention to task.  OT Frequency: Min 1X/week    Co-evaluation              AM-PAC OT "6 Clicks" Daily Activity     Outcome Measure Help from another person eating meals?: Total (currently NPO) Help from another person taking care of personal grooming?: A Lot Help from another person toileting, which includes using toliet, bedpan, or urinal?: Total Help from another person bathing (including washing, rinsing, drying)?: Total Help from another person to put on and taking off regular upper body clothing?: A Lot Help from another person to put on and taking off regular lower body clothing?: Total 6 Click Score: 8   End of Session Equipment Utilized During Treatment: Oxygen Nurse Communication: Mobility status  Activity Tolerance: Patient limited by lethargy;Other (comment) (Patient limited by current cognitive level.) Patient left: in bed;with call bell/phone within reach;with bed alarm set  OT Visit Diagnosis: Other abnormalities of gait and mobility (R26.89);Muscle weakness (generalized) (M62.81);Other  symptoms and signs involving cognitive function;Other (comment) (Decreased activity tolerance)                Time: 1610-9604 OT Time Calculation (min): 29 min Charges:  OT General Charges $OT Visit: 1 Visit OT Evaluation $OT Eval Moderate Complexity: 1 Mod OT Treatments $Self Care/Home Management : 8-22 mins  Sherlin Sonier "Orson Eva., OTR/L, MA Acute Rehab [phone]   Lendon Colonel 12/13/2022, 3:19 PM

## 2022-12-13 NOTE — Progress Notes (Signed)
Rounding Note    Patient Name: [name] Date of Encounter: 12/13/2022  [facility] Cardiologist: [facility]  Subjective   More alert this AM Swallowing sips.  Not yet to pills. Thirsty for water.  Inpatient Medications    Scheduled Meds:  Chlorhexidine Gluconate Cloth  6 each Topical Daily   digoxin  0.125 mg Intravenous Daily   hydrocerin   Topical Daily   metoprolol tartrate  5 mg Intravenous Q6H   trimethoprim-polymyxin b  2 drop Both Eyes Q4H   Continuous Infusions:  amiodarone 30 mg/hr (12/13/22 0529)   ampicillin-sulbactam (UNASYN) IV 3 g (12/13/22 1221)   heparin 1,300 Units/hr (12/13/22 0529)   lactated ringers 75 mL/hr at 12/13/22 1223   PRN Meds: prochlorperazine, sodium chloride flush   Vital Signs    Vitals:   12/13/22 0538 12/13/22 0600 12/13/22 1137 12/13/22 1200  BP:  122/73  (!) 141/89  Pulse: (!) 102 100  81  Resp: (!) 40 20    Temp:      TempSrc:      SpO2: 97% 100% 95%   Weight: 76.2 kg     Height:        Intake/Output Summary (Last 24 hours) at 12/13/2022 1303 Last data filed at 12/13/2022 1006 Gross per 24 hour  Intake 1439.35 ml  Output 0 ml  Net 1439.35 ml      12/13/2022    5:38 AM 12/11/2022    6:40 PM 11/30/2022    7:42 PM  Last 3 Weights  Weight (lbs) 167 lb 15.9 oz 158 lb 15.2 oz 170 lb  Weight (kg) 76.2 kg 72.1 kg 77.111 kg      Telemetry    Rate controlled AF with rare PVCs - Personally Reviewed  ECG    No new ECG tracing today. - Personally Reviewed  Physical Exam   GEN: No acute distress.  Chronically ill male Neck: No JVD Cardiac: IRIR, no murmurs, rubs, or gallops.  Respiratory: Clear to auscultation bilaterally. GI: Soft, nontender, non-distended  MS: No edema; Decrease right hand strength, Mit on the left hand  Labs    High Sensitivity Troponin:   Recent Labs  Lab 12/04/2022 1928 12/01/2022 2215  TROPONINIHS 103* 85*     Chemistry Recent Labs  Lab 11/30/2022 1928 12/06/2022 1938  12/11/22 0823 12/11/22 0830 12/11/22 1918 12/12/22 0611 12/13/22 0752  NA 156*   < > 152* 152* 150* 150* 146*  K 4.6   < > 4.3 4.2  --  3.7 3.6  CL 118*   < > 118*  --   --  114* 114*  CO2 21*   < > 21*  --   --  23 21*  GLUCOSE 134*   < > 165*  --   --  163* 147*  BUN 44*   < > 44*  --   --  48* 39*  CREATININE 2.15*   < > 2.04*  --   --  1.92* 1.52*  CALCIUM 8.8*   < > 8.6*  --   --  8.4* 7.9*  MG 2.2  --   --   --   --  1.8 2.2  PROT 6.2*   < > 6.1*  --   --  5.4* 5.0*  ALBUMIN 3.0*   < > 2.9*  --   --  2.6* 2.2*  AST 327*   < > 376*  --   --  165* 149*  ALT 173*   < >  231*  --   --  187* 172*  ALKPHOS 109   < > 121  --   --  99 95  BILITOT 3.9*   < > 3.1*  --   --  2.0* 1.5*  GFRNONAA 30*   < > 32*  --   --  34* 45*  ANIONGAP 17*   < > 13  --   --  13 11   < > = values in this interval not displayed.    Lipids No results for input(s): "CHOL", "TRIG", "HDL", "LABVLDL", "LDLCALC", "CHOLHDL" in the last 168 hours.  Hematology Recent Labs  Lab 12/11/22 0823 12/11/22 0830 12/11/22 1918 12/12/22 0611 12/13/22 0752  WBC 11.7*  --   --  8.2 8.4  RBC 4.68  --   --  4.37 4.24  HGB 14.5   < > 15.5 13.7 13.2  HCT 47.8   < > 50.0 43.7 41.8  MCV 102.1*  --   --  100.0 98.6  MCH 31.0  --   --  31.4 31.1  MCHC 30.3  --   --  31.4 31.6  RDW 16.2*  --   --  15.9* 15.9*  PLT 155  --   --  116* 76*   < > = values in this interval not displayed.   Thyroid No results for input(s): "TSH", "FREET4" in the last 168 hours.  BNP Recent Labs  Lab Dec 22, 2022 1928 12/12/22 0611  BNP 1,420.9* 911.9*    DDimer No results for input(s): "DDIMER" in the last 168 hours.   Radiology    DG Chest Port 1 View  Result Date: 12/12/2022 CLINICAL DATA:  PICC line placement EXAM: PORTABLE CHEST 1 VIEW COMPARISON:  12/12/2022 at 6:12 a.m. FINDINGS: A new right upper extremity PICC line is observed, tip projecting over the SVC. No pneumothorax or complicating feature. AICD remains in place.  Atherosclerotic calcification of the aortic arch. Increased retrocardiac airspace opacity suspicious for left lower lobe atelectasis or pneumonia. Hazy density in the right infrahilar region could also reflect early pneumonia or edema. Borderline enlargement of the cardiopericardial silhouette. Mildly indistinct pulmonary vasculature. The faint right upper lobe opacity seen on the exam earlier today is less conspicuous. Degenerative glenohumeral spurring bilaterally. IMPRESSION: 1. Right upper extremity PICC line tip projects over the SVC. 2. Increased retrocardiac airspace opacity suspicious for left lower lobe atelectasis or pneumonia. 3. Hazy density in the right infrahilar region could also reflect early pneumonia or edema. 4. Borderline enlargement of the cardiopericardial silhouette. Electronically Signed   By: Gaylyn Rong M.D.   On: 12/12/2022 17:54   [place] EKG SITE RITE  Result Date: 12/12/2022 If Site Rite image not attached, placement could not be confirmed due to current cardiac rhythm.  DG Chest Port 1 View  Result Date: 12/12/2022 CLINICAL DATA:  Shortness of breath EXAM: PORTABLE CHEST 1 VIEW COMPARISON:  12/11/2022 FINDINGS: Biventricular ICD/pacer leads in stable position. Stable cardiac enlargement. Unremarkable mediastinal contours. Opacity in the right upper lobe which is an infiltrate based on recent chest CT. No effusion or pneumothorax. IMPRESSION: Mild right upper lobe infiltrate without progression. Electronically Signed   By: Tiburcio Pea M.D.   On: 12/12/2022 07:10    Cardiac Studies   Echocardiogram 12/11/2022: Impressions:  1. Left ventricular ejection fraction, by estimation, is 50 to 55%. The  left ventricle has low normal function. Left ventricular endocardial  border not optimally defined to evaluate regional wall motion. Left  ventricular diastolic function  could not be  evaluated.   2. Right ventricular systolic function is moderately reduced. The right   ventricular size is normal.   3. Right atrial size was severely dilated.   4. The mitral valve is normal in structure. Trivial mitral valve  regurgitation. No evidence of mitral stenosis.   5. The aortic valve is normal in structure. Aortic valve regurgitation is  not visualized. No aortic stenosis is present.   6. The inferior vena cava is dilated in size with <50% respiratory  variability, suggesting right atrial pressure of 15 mmHg.   Conclusion(s)/Recommendation(s): Technically very limited study with poor  sound wave transmission. LV never seen well but EF appears preserved on  apica images. RV is moderately hypokientic. There is a pcing wire evident  in the coronary sinus.    Patient Profile     87 y.o. male with a history of non-ischemic cardiomyopathy/ chronic combined CHF with EF of 45-50% on Echo in 04/2021  s/p ICD in 2014, permanent atrial fibrillation on Eliquis, COPD, hypertension, hyperlipidemia, and remote tobacco abuse who presented on 12/17/2022 after being found down by neighbors after 2 days of not being seen. He was found to be hypoxic requiring NRB and was admitted for sepsis secondary to right upper lobe pneumonia (aspiration pneumonia), acute PE, and acute metabolic encephalopathy. He was also noted to be severely dehydrated with AKI and hypernatremia. Cardiology was consulted for atrial fibrillation with RVR.  Assessment & Plan    Permanent Atrial Fibrillation  NSVT Patient admitted with severe sepsis secondary to aspiration pneumonia and acute PE with associated cor pulmonale as well as severe dehydration with AKI. He has permanent atrial fibrillation at baseline and was noted to be in RVR on admission. Echo showed LVEF of 50-55% with moderately reduced RV dysfunction and severe dilated RA. He was placed on IV Amiodarone given hypotension. - Rates better controlled - Continue IV Amiodarone.  - Continue IV Digoxin 0.19mcg daily. Resume home PO Digoxin when able to  take PO medications; if this is tomorrow we will also and convernt to PO amiodarone if able to progress beyond sips  - Can resume home Metoprolol when able to take PO medications if BP allows. - Continue IV Heparin for now. Resume DOAC when able to take PO medications and stable, no hematuria presently  Non-Ischemic Cardiomyopathy Chronic Combined CHF LVEF previously 30-35% in 2022 but has since improved. Echo this admission showed LVEF of  LVEF of 50-55% with moderately reduced RV dysfunction and severe dilated RA. - Volume status euvolemic - Home Entresto and Metoprolol on hold due to hypotension and inability to take PO medications given acute encephalopathy. Can gradually restart as able.  Demand Ischemia High-sensitivity troponin 103 >> 85 consistent with demand ischemia in setting of acute illness. - No need for ischemic evaluation at this time.  AKI Creatinine 2.15 on admission. Insetting of severe dehydration. Improving with fluids.  - Creatinine 1.52 (improving)  Otherwise, per primary team: - Severe sepsis secondary to aspiration pneumonia - Acute PE with cor pulmonale - Acute metabolic encephalopathy - AKI - Hypernatremia - Hematuria - Fall and rhabdomyolysis - Transaminitis and bilirubinemia - Type 2 diabetes mellitus - Hyperlipidemia    For questions or updates, please contact [facility] Please consult [email or web address] for contact info under   Riley Lam, MD FASE [facility] Cardiologist [facility]  [address] [place], [address] [place], [place] [address] [phone]  1:34 PM

## 2022-12-13 NOTE — Progress Notes (Signed)
ANTICOAGULATION CONSULT NOTE   Pharmacy Consult for Eliquis Indication: atrial fibrillation and pulmonary embolus  Allergies  Allergen Reactions   Prednisone Other (See Comments)    Psychosis    Zestril [Lisinopril]     Unknown reaction   Glucosamine Rash    Patient Measurements: Height: 6\' 3"  (190.5 cm) Weight: 76.2 kg (167 lb 15.9 oz) IBW/kg (Calculated) : 84.5 Heparin Dosing Weight: TBW  Vital Signs: Temp: 98 F (36.7 C) (07/19 0515) Temp Source: Oral (07/19 1200) BP: 141/89 (07/19 1200) Pulse Rate: 81 (07/19 1200)  Labs: Recent Labs    12/23/2022 1928 12/18/2022 1938 12/07/2022 2215 12/21/2022 2331 12/11/22 0344 12/11/22 0823 12/11/22 0830 12/11/22 1918 12/12/22 0611 12/13/22 0730 12/13/22 0752  HGB 14.1   < >  --   --    < > 14.5   < > 15.5 13.7  --  13.2  HCT 44.2   < >  --   --    < > 47.8   < > 50.0 43.7  --  41.8  PLT 179  --   --   --    < > 155  --   --  116*  --  76*  APTT 31  --   --  33  --   --   --   --   --   --   --   LABPROT 19.7*  --   --   --   --   --   --   --   --   --   --   INR 1.7*  --   --   --   --   --   --   --   --   --   --   HEPARINUNFRC  --   --   --  <0.10*  --  0.45  --   --   --  0.34  --   CREATININE 2.15*   < >  --   --    < > 2.04*  --   --  1.92*  --  1.52*  CKTOTAL 333  --   --   --   --   --   --   --  137  --   --   TROPONINIHS 103*  --  85*  --   --   --   --   --   --   --   --    < > = values in this interval not displayed.    Estimated Creatinine Clearance: 39 mL/min (A) (by C-G formula based on SCr of 1.52 mg/dL (H)).   Medical History: Past Medical History:  Diagnosis Date   CAD (coronary artery disease)    Nonobstructive   COPD (chronic obstructive pulmonary disease) (HCC)    HLD (hyperlipidemia)    HTN (hypertension)    NICM (nonischemic cardiomyopathy) (HCC)    S/p St.Jude AICD placement   Permanent atrial fibrillation (HCC)    Assessment: 60 YOM presenting after being found down, CT angio chest with  PE (lower lobe peripheral, low clot burden), hx of afib on Eliquis PTA however baseline Anti-Xa level on admission undetectable suggesting no recent doses PTA.    7/17: Shift change RN notified pharmacy of incorrect rate in pump (3000u/h instead of 1300 u/h), for unknown period of time, also noted blood in urine and bleeding around mouth. Contacted MD and agreement on holding heparin gtt, obtaining heparin level and awaiting MD  eval.  Heparin level came back therapeutic at 0.45 which may indicate heparin gtt was running at correct rate most of the time overnight.    Afternoon update: Plan to switch to Eliquis  Goal of Therapy:  Heparin level 0.3-0.7 units/ml Monitor platelets by anticoagulation protocol: Yes   Plan:  Stop heparin. Start Eliquis 2.5mg  BID (reduced for Scr 1.52 and age >80 years) Will follow peripherally for adjusting back to 5mg  BID  Eldridge Scot, PharmD Clinical Pharmacist 12/13/2022, 3:09 PM

## 2022-12-13 NOTE — Progress Notes (Signed)
ANTICOAGULATION CONSULT NOTE   Pharmacy Consult for heparin Indication: atrial fibrillation and pulmonary embolus  Allergies  Allergen Reactions   Prednisone Other (See Comments)    Psychosis    Zestril [Lisinopril]     Unknown reaction   Glucosamine Rash    Patient Measurements: Height: 6\' 3"  (190.5 cm) Weight: 76.2 kg (167 lb 15.9 oz) IBW/kg (Calculated) : 84.5 Heparin Dosing Weight: TBW  Vital Signs: Temp: 98 F (36.7 C) (07/19 0515) Temp Source: Oral (07/19 0515) BP: 122/73 (07/19 0600) Pulse Rate: 100 (07/19 0600)  Labs: Recent Labs    12-13-22 1928 2022/12/13 1938 12-13-2022 2215 13-Dec-2022 2331 12/11/22 0344 12/11/22 0823 12/11/22 0830 12/11/22 1918 12/12/22 0611 12/13/22 0730 12/13/22 0752  HGB 14.1   < >  --   --  14.4 14.5   < > 15.5 13.7  --  13.2  HCT 44.2   < >  --   --  46.5 47.8   < > 50.0 43.7  --  41.8  PLT 179  --   --   --  145* 155  --   --  116*  --  76*  APTT 31  --   --  33  --   --   --   --   --   --   --   LABPROT 19.7*  --   --   --   --   --   --   --   --   --   --   INR 1.7*  --   --   --   --   --   --   --   --   --   --   HEPARINUNFRC  --   --   --  <0.10*  --  0.45  --   --   --  0.34  --   CREATININE 2.15*   < >  --   --  1.85* 2.04*  --   --  1.92*  --   --   CKTOTAL 333  --   --   --   --   --   --   --  137  --   --   TROPONINIHS 103*  --  85*  --   --   --   --   --   --   --   --    < > = values in this interval not displayed.    Estimated Creatinine Clearance: 30.9 mL/min (A) (by C-G formula based on SCr of 1.92 mg/dL (H)).   Medical History: Past Medical History:  Diagnosis Date   CAD (coronary artery disease)    Nonobstructive   COPD (chronic obstructive pulmonary disease) (HCC)    HLD (hyperlipidemia)    HTN (hypertension)    NICM (nonischemic cardiomyopathy) (HCC)    S/p St[employer]ude AICD placement   Permanent atrial fibrillation (HCC)    Assessment: 87 YOM presenting after being found down, CT angio chest with PE  (lower lobe peripheral, low clot burden), hx of afib on Eliquis PTA however baseline Anti-Xa level on admission undetectable suggesting no recent doses PTA.    7/17: Shift change RN notified pharmacy of incorrect rate in pump (3000u/h instead of 1300 u/h), for unknown period of time, also noted blood in urine and bleeding around mouth. Contacted MD and agreement on holding heparin gtt, obtaining heparin level and awaiting MD eval.  Heparin level came back therapeutic at 0.45 which  may indicate heparin gtt was running at correct rate most of the time overnight.    Heparin level continue to be therapeutic. Plt down to 76. If pass speech eval, ok to transition to PO apixaban.   Goal of Therapy:  Heparin level 0.3-0.7 units/ml Monitor platelets by anticoagulation protocol: Yes   Plan:  Cont heparin infusion at 1300 units/hr Monitor daily CBC, heparin level and for signs and symptoms of bleeding  Ulyses Southward, PharmD, BCIDP, AAHIVP, CPP Infectious Disease Pharmacist 12/13/2022 10:43 AM

## 2022-12-13 NOTE — Progress Notes (Signed)
Crcl>30 ml/min. We will adjust Unasyn to 3g IV q8  Ulyses Southward, PharmD, BCIDP, AAHIVP, CPP Infectious Disease Pharmacist 12/13/2022 1:47 PM

## 2022-12-13 NOTE — Evaluation (Signed)
Physical Therapy Evaluation Patient Details Name: [name] MRN: [id number] DOB: Sep 22, 1935 Today's Date: 12/13/2022  History of Present Illness  87 yo male admitted 7/16 after found down with metabolic encephalopathy and PE. PMHx: NICM, CHF, Afib, ICD, HTN, HLD, COPD, DM  Clinical Impression  Pt initially pleasant and stating he lives alone in a house, drives and cares for himself. No family present to confirm home setup or PLOF. Pt perseverating on wanting water despite education for NPO and threatening to sue everyone. Pt with decreased cognition, mobility, strength, function and transfers who will benefit from acute therapy to maximize mobility, safety and independence to decrease burden of care.     98% on 5L, on RA desaturation to 85% with return to 5L  and 92% BP 99/72 (79) HR 109      Assistance Recommended at Discharge Frequent or constant Supervision/Assistance  If plan is discharge home, recommend the following:  Can travel by private vehicle  Two people to help with walking and/or transfers;Two people to help with bathing/dressing/bathroom;Assistance with feeding;Assist for transportation;Assistance with cooking/housework;Direct supervision/assist for financial management;Direct supervision/assist for medications management;Help with stairs or ramp for entrance   No    Equipment Recommendations Other (comment) (TBD)  Recommendations for Other Services       Functional Status Assessment Patient has had a recent decline in their functional status and/or demonstrates limited ability to make significant improvements in function in a reasonable and predictable amount of time     Precautions / Restrictions Precautions Precautions: Fall;Other (comment) Precaution Comments: watch sats, edematous weeping LUE      Mobility  Bed Mobility Overal bed mobility: Needs Assistance Bed Mobility: Supine to Sit, Sit to Supine     Supine to sit: Mod assist Sit to supine:  Max assist   General bed mobility comments: mod assist to roll and transition to sitting with HOB 30 degrees. Pt would not make any effort to assist with scooting or standing once EOB as internally distracted and perseverating on wanting water. Max assist to return to bed and total +2 to slide toward [facility]    Transfers                   General transfer comment: unable    Ambulation/Gait                  Stairs            Wheelchair Mobility     Tilt Bed    Modified Rankin (Stroke Patients Only)       Balance Overall balance assessment: Needs assistance   Sitting balance-Leahy Scale: Fair Sitting balance - Comments: EOb with guarding without UB support                                     Pertinent Vitals/Pain Pain Assessment Pain Assessment: No/denies pain    Home Living Family/patient expects to be discharged to:: Private residence Living Arrangements: Alone   Type of Home: House Home Access: Level entry       Home Layout: One level Home Equipment: None      Prior Function Prior Level of Function : Independent/Modified Independent;Driving                     Hand Dominance        Extremity/Trunk Assessment   Upper Extremity Assessment Upper Extremity Assessment: Generalized  weakness;LUE deficits/detail LUE Deficits / Details: edema with weeping, limited command following and unable to fully assess grossly 2/5    Lower Extremity Assessment Lower Extremity Assessment: Generalized weakness;Difficult to assess due to impaired cognition    Cervical / Trunk Assessment Cervical / Trunk Assessment: Normal  Communication   Communication: HOH  Cognition Arousal/Alertness: Awake/alert Behavior During Therapy: Flat affect Overall Cognitive Status: Impaired/Different from baseline Area of Impairment: Orientation, Attention, Memory, Following commands, Safety/judgement, Problem solving                  Orientation Level: Disoriented to, Time, Situation Current Attention Level: Focused Memory: Decreased short-term memory Following Commands: Follows one step commands inconsistently, Follows one step commands with increased time Safety/Judgement: Decreased awareness of safety, Decreased awareness of deficits   Problem Solving: Slow processing General Comments: pt perseverating on wanting water        General Comments      Exercises     Assessment/Plan    PT Assessment Patient needs continued PT services  PT Problem List Decreased strength;Decreased coordination;Cardiopulmonary status limiting activity;Decreased range of motion;Decreased cognition;Decreased activity tolerance;Decreased balance;Decreased mobility;Decreased knowledge of precautions;Decreased safety awareness       PT Treatment Interventions DME instruction;Gait training;Functional mobility training;Patient/family education;Therapeutic activities;Cognitive remediation;Balance training;Therapeutic exercise    PT Goals (Current goals can be found in the Care Plan section)  Acute Rehab PT Goals Patient Stated Goal: return home, get water PT Goal Formulation: With patient Time For Goal Achievement: 12/27/22 Potential to Achieve Goals: Fair    Frequency Min 2X/week     Co-evaluation               AM-PAC PT "6 Clicks" Mobility  Outcome Measure Help needed turning from your back to your side while in a flat bed without using bedrails?: A Lot Help needed moving from lying on your back to sitting on the side of a flat bed without using bedrails?: A Lot Help needed moving to and from a bed to a chair (including a wheelchair)?: Total Help needed standing up from a chair using your arms (e.g., wheelchair or bedside chair)?: Total Help needed to walk in hospital room?: Total Help needed climbing 3-5 steps with a railing? : Total 6 Click Score: 8    End of Session   Activity Tolerance: Patient limited by  fatigue Patient left: in bed;with call bell/phone within reach;with bed alarm set Nurse Communication: Need for lift equipment PT Visit Diagnosis: Other abnormalities of gait and mobility (R26.89);Muscle weakness (generalized) (M62.81)    Time: 5409-8119 PT Time Calculation (min) (ACUTE ONLY): 35 min   Charges:   PT Evaluation $PT Eval Moderate Complexity: 1 Mod PT Treatments $Therapeutic Activity: 8-22 mins PT General Charges $$ ACUTE PT VISIT: 1 Visit         Merryl Hacker, PT Acute Rehabilitation Services Office: [phone]   [name] 12/13/2022, 11:45 AM

## 2022-12-14 ENCOUNTER — Inpatient Hospital Stay (HOSPITAL_COMMUNITY): Payer: Medicare HMO

## 2022-12-14 DIAGNOSIS — I330 Acute and subacute infective endocarditis: Secondary | ICD-10-CM | POA: Diagnosis not present

## 2022-12-14 DIAGNOSIS — I4819 Other persistent atrial fibrillation: Secondary | ICD-10-CM

## 2022-12-14 DIAGNOSIS — R4182 Altered mental status, unspecified: Secondary | ICD-10-CM | POA: Diagnosis not present

## 2022-12-14 LAB — COMPREHENSIVE METABOLIC PANEL
ALT: 199 U/L — ABNORMAL HIGH (ref 0–44)
AST: 160 U/L — ABNORMAL HIGH (ref 15–41)
Albumin: 2.4 g/dL — ABNORMAL LOW (ref 3.5–5.0)
Alkaline Phosphatase: 93 U/L (ref 38–126)
Anion gap: 9 (ref 5–15)
BUN: 34 mg/dL — ABNORMAL HIGH (ref 8–23)
CO2: 26 mmol/L (ref 22–32)
Calcium: 7.9 mg/dL — ABNORMAL LOW (ref 8.9–10.3)
Chloride: 109 mmol/L (ref 98–111)
Creatinine, Ser: 1.45 mg/dL — ABNORMAL HIGH (ref 0.61–1.24)
GFR, Estimated: 48 mL/min — ABNORMAL LOW (ref >60)
Glucose, Bld: 124 mg/dL — ABNORMAL HIGH (ref 70–99)
Potassium: 4.1 mmol/L (ref 3.5–5.1)
Sodium: 144 mmol/L (ref 135–145)
Total Bilirubin: 1.1 mg/dL (ref 0.3–1.2)
Total Protein: 5.5 g/dL — ABNORMAL LOW (ref 6.5–8.1)

## 2022-12-14 LAB — CBC WITH DIFFERENTIAL/PLATELET
Abs Immature Granulocytes: 0.06 10*3/uL (ref 0.00–0.07)
Basophils Absolute: 0 10*3/uL (ref 0.0–0.1)
Basophils Relative: 0 %
Eosinophils Absolute: 0.1 10*3/uL (ref 0.0–0.5)
Eosinophils Relative: 1 %
HCT: 45.4 % (ref 39.0–52.0)
Hemoglobin: 13.7 g/dL (ref 13.0–17.0)
Immature Granulocytes: 1 %
Lymphocytes Relative: 11 %
Lymphs Abs: 0.9 10*3/uL (ref 0.7–4.0)
MCH: 30.8 pg (ref 26.0–34.0)
MCHC: 30.2 g/dL (ref 30.0–36.0)
MCV: 102 fL — ABNORMAL HIGH (ref 80.0–100.0)
Monocytes Absolute: 0.8 10*3/uL (ref 0.1–1.0)
Monocytes Relative: 10 %
Neutro Abs: 6.2 10*3/uL (ref 1.7–7.7)
Neutrophils Relative %: 77 %
Platelets: 89 10*3/uL — ABNORMAL LOW (ref 150–400)
RBC: 4.45 MIL/uL (ref 4.22–5.81)
RDW: 16 % — ABNORMAL HIGH (ref 11.5–15.5)
WBC: 8.1 10*3/uL (ref 4.0–10.5)
nRBC: 0 % (ref 0.0–0.2)

## 2022-12-14 LAB — GLUCOSE, CAPILLARY
Glucose-Capillary: 123 mg/dL — ABNORMAL HIGH (ref 70–99)
Glucose-Capillary: 133 mg/dL — ABNORMAL HIGH (ref 70–99)
Glucose-Capillary: 143 mg/dL — ABNORMAL HIGH (ref 70–99)
Glucose-Capillary: 145 mg/dL — ABNORMAL HIGH (ref 70–99)
Glucose-Capillary: 160 mg/dL — ABNORMAL HIGH (ref 70–99)
Glucose-Capillary: 76 mg/dL (ref 70–99)

## 2022-12-14 LAB — PROCALCITONIN: Procalcitonin: <0.1 ng/mL

## 2022-12-14 LAB — CULTURE, BLOOD (ROUTINE X 2)

## 2022-12-14 LAB — C-REACTIVE PROTEIN: CRP: 4.1 mg/dL — ABNORMAL HIGH (ref <1.0)

## 2022-12-14 LAB — MAGNESIUM: Magnesium: 2 mg/dL (ref 1.7–2.4)

## 2022-12-14 MED ORDER — METOPROLOL TARTRATE 25 MG PO TABS
25.0000 mg | ORAL_TABLET | Freq: Two times a day (BID) | ORAL | Status: DC
  Administered 2022-12-14 – 2022-12-15 (×2): 25 mg via ORAL
  Filled 2022-12-14 (×2): qty 1

## 2022-12-14 MED ORDER — AMIODARONE HCL 200 MG PO TABS
200.0000 mg | ORAL_TABLET | Freq: Two times a day (BID) | ORAL | Status: DC
  Administered 2022-12-14 – 2022-12-15 (×3): 200 mg via ORAL
  Filled 2022-12-14 (×3): qty 1

## 2022-12-14 MED ORDER — APIXABAN 5 MG PO TABS
5.0000 mg | ORAL_TABLET | Freq: Two times a day (BID) | ORAL | Status: DC
  Administered 2022-12-14 – 2022-12-15 (×2): 5 mg via ORAL
  Filled 2022-12-14: qty 1
  Filled 2022-12-14: qty 2

## 2022-12-14 MED ORDER — AMIODARONE HCL 200 MG PO TABS: 200.0000 mg | ORAL_TABLET | Freq: Every day | ORAL | Status: DC

## 2022-12-14 NOTE — Progress Notes (Signed)
ANTICOAGULATION CONSULT NOTE   Pharmacy Consult for Eliquis Indication: atrial fibrillation and pulmonary embolus  Allergies  Allergen Reactions   Prednisone Other (See Comments)    Psychosis    Zestril [Lisinopril]     Unknown reaction   Glucosamine Rash    Patient Measurements: Height: 6\' 3"  (190.5 cm) Weight: 76.2 kg (167 lb 15.9 oz) IBW/kg (Calculated) : 84.5 Heparin Dosing Weight: TBW  Vital Signs: Temp: 98.1 F (36.7 C) (07/20 1200) Temp Source: Axillary (07/20 1200) BP: 118/61 (07/20 1200) Pulse Rate: 82 (07/20 1200)  Labs: Recent Labs    12/12/22 0611 12/13/22 0730 12/13/22 0752 12/14/22 0228  HGB 13.7  --  13.2 13.7  HCT 43.7  --  41.8 45.4  PLT 116*  --  76* 89*  HEPARINUNFRC  --  0.34  --   --   CREATININE 1.92*  --  1.52* 1.45*  CKTOTAL 137  --   --   --     Estimated Creatinine Clearance: 40.9 mL/min (A) (by C-G formula based on SCr of 1.45 mg/dL (H)).   Medical History: Past Medical History:  Diagnosis Date   CAD (coronary artery disease)    Nonobstructive   COPD (chronic obstructive pulmonary disease) (HCC)    HLD (hyperlipidemia)    HTN (hypertension)    NICM (nonischemic cardiomyopathy) (HCC)    S/p [employer]ude AICD placement   Permanent atrial fibrillation (HCC)    Assessment: 38 YOM presenting after being found down, CT angio chest with PE (lower lobe peripheral, low clot burden), hx of afib on Eliquis PTA however baseline Anti-Xa level on admission undetectable suggesting no recent doses PTA.    7/17: Shift change RN notified pharmacy of incorrect rate in pump (3000u/h instead of 1300 u/h), for unknown period of time, also noted blood in urine and bleeding around mouth. Contacted MD and agreement on holding heparin gtt, obtaining heparin level and awaiting MD eval.  Heparin level came back therapeutic at 0.45 which may indicate heparin gtt was running at correct rate most of the time overnight.    Pt was transitioned to PO apixaban on  7/19. With PE, we will use standard dose.   Goal of Therapy:  Monitor platelets by anticoagulation protocol: Yes   Plan:  Increase apixaban to 5mg  PO BID Rx will cont to follow peripherally  Ulyses Southward, PharmD, BCIDP, AAHIVP, CPP Infectious Disease Pharmacist 12/14/2022 3:10 PM

## 2022-12-14 NOTE — Progress Notes (Addendum)
PROGRESS NOTE    [name]  WUJ:[id number] DOB: 11-Jun-1935 DOA: 12/23/2022 PCP: Eartha Inch, MD   Brief Narrative: [name] is a 87 y.o. male with a history of nonischemic cardiomyopathy, chronic heart failure with reduced EF, permanent atrial fibrillation on Eliquis, history of biventricular ICD placement, hypertension, hyperlipidemia, COPD.  Patient presented secondary to being found down.  On arrival to the emergency department, patient was found to have atrial fibrillation with rapid ventricular response, tachypnea, elevated temperature with concern for sepsis.  Workup revealed evidence of right upper lobe pneumonia in addition to evidence of bilateral peripheral PE.  Labs are significant for hyponatremia and AKI consistent with significant dehydration.  Patient started on empiric antibiotics in addition to anticoagulation.  Hospitalization complicated by development of hematuria and persistent atrial fibrillation with RVR and hypotension.   Assessment/Plan:  Severe dehydration from falling down at home, inability to eat or drink for several days with AKI and hypernatremia.  Hydrate, switch fluids to D5W, PT OT, still has encephalopathy and unable to cooperate in exam or interview, prognosis still guarded, continue to monitor.  De-escalate antibiotics, UA stable, possible aspiration pneumonia for which she will be placed on Unasyn on 12/12/2022, 1 out of 4 blood cultures likely contamination.  Follow final cultures.  PT  OT and speech once he is able to cooperate.  Sepsis pathophysiology present upon admission has resolved.  Right upper lobe pneumonia with Strep GRANULICATELLA ADIACENS bacteremia -  ID following on ABX, TEE requested. Has AICD.  Acute metabolic encephalopathy, dysphagia.  Likely secondary to acute illness and likely contributed to by dehydration and hypernatremia.  CT head and C-spine nonacute, no focal deficits or headache.  Being followed by PT, OT and  speech, currently on dysphagia 2 diet and nectar thick liquids, monitor.  Acute pulmonary embolism Noted on admission on CT angio of the chest.  Peripheral bilateral pulmonary emboli with low clot burden and unlikely right heart strain.  Patient is on Eliquis as an outpatient for atrial fibrillation.  Patient transition to heparin IV heparin.  Echo noted with reduction in RV systolic function.  Monitor.  Acute respiratory failure with hypoxia Secondary to evidence of pneumonia on imaging.  Patient was placed on nonrebreather mask for SpO2 in the 70s.  Now weaned off supplemental oxygen.  Respiratory failure appears to be resolved.  Permanent atrial fibrillation with RVR Patient is on metoprolol, Eliquis and digoxin as an outpatient.  Patient with RVR this admission in the setting of severe dehydration and acute PE.  Cardiology following on amiodarone drip.  Elevated AST/ALT, Hyperbilirubinemia CT abdomen/pelvis without etiology for elevations.  Shock liver due to hypotension improving, will also go ahead and check right upper quadrant ultrasound.  AKI Baseline creatinine of about 1-1.1 from outside records.  Extremely dehydrated, has Foley catheter continue to hydrate and monitor.  No obstruction on CT abdomen pelvis.  Elevated troponin Troponin of 103 on admission with delta of 85.  No chest pain.  Likely demand ischemia related to fibrillation with RVR.  Chronic heart failure with reduced EF LVEF listed as 40-45%.  Patient is status post biventricular ICD placement.  ENP elevated due to combination of chronic left and now right heart strain.  Clinically dehydrated.  Blood pressure too low for beta-blocker, ACE/ARB medications at this point.  Cardiology following.  Fall Patient found down. CK total of 333. -PT/OT evaluation  Diabetes mellitus type 2 Patient with a history of diabetes mellitus.  Currently controlled with  hemoglobin A1c of 5.6%.  Patient is not on medication management as an  outpatient. -Carb modified diet once able to take p.o.  Hyperlipidemia Patient is on Lipitor as an outpatient which was held on admission secondary to n.p.o. status.  Thrombocytopenia - check smear, some due to dilution  QTc prolongation Noted on EKG.  Complicated by bundle branch block.  Patient is now on amiodarone. -On telemetry defer to cardiology.   DVT prophylaxis: Heparin IV >> Eliquis Code Status:   Code Status: DNR Family Communication: Faylene Kurtz in detail phone #[phone] on 12/12/2022 >> DNR, no feeding tubes, updated 12/13/23, 12/14/22 Disposition Plan: SNF   Consultants:  Cardiology, [facility], ID, cardiology for TEE  Procedures:   CT chest abdomen pelvis.   1. Positive examination for peripheral lower lobe pulmonary emboli. No significant central pulmonary embolus. Low clot burden indicates right heart strain would be unlikely. 2. Airspace disease in the right upper lung is likely pneumonia. 3. Diffuse emphysematous changes in the lungs. Chronic bronchitic changes. 4. Aortic atherosclerosis. 5. Heterogeneous striated nephrograms bilaterally likely indicate pyelonephritis. 6. Prostate gland is enlarged.  Echocardiogram.  1. Left ventricular ejection fraction, by estimation, is 50 to 55%. The left ventricle has low normal function. Left ventricular endocardial border not optimally defined to evaluate regional wall motion. Left ventricular diastolic function could not be evaluated.  2. Right ventricular systolic function is moderately reduced. The right ventricular size is normal.  3. Right atrial size was severely dilated.  4. The mitral valve is normal in structure. Trivial mitral valve regurgitation. No evidence of mitral stenosis.  5. The aortic valve is normal in structure. Aortic valve regurgitation is not visualized. No aortic stenosis is present.  6. The inferior vena cava is dilated in size with <50% respiratory variability, suggesting right atrial pressure of 15 mmHg.  Conclusion(s)/Recommendation(s): Technically very limited study with poor sound wave transmission. LV never seen well but EF appears preserved on apica images. RV is moderately hypokientic. There is a pcing wire evident in the coronary sinus.    CT head and C-spine.  Nonacute. TEE Right upper quadrant ultrasound      Antimicrobials: Unasyn started 12/12/2022   Subjective:  Patient in bed quite sleepy, arousable, denies any headache but not a heritable historian, appears to be in no discomfort.  Objective: BP 110/70   Pulse 83   Temp (!) 96.5 F (35.8 C) (Axillary)   Resp 17   Ht 6\' 3"  (1.905 m)   Wt 76.2 kg   SpO2 100%   BMI 21.00 kg/m   Examination:  Minimally responsive in bed, unable to answer questions reliably, moving all 4 remedies by himself, Foley catheter in place [place].AT,PERRAL Supple Neck, No JVD,   Symmetrical Chest wall movement, Good air movement bilaterally, CTAB RRR,No Gallops, Rubs or new Murmurs,  +ve B.Sounds, Abd Soft, No tenderness,   No Cyanosis, Clubbing or edema     Data Reviewed: I have personally reviewed following labs and imaging studies  Recent Labs  Lab 12/11/22 0344 12/11/22 0823 12/11/22 0830 12/11/22 1918 12/12/22 0611 12/13/22 0752 12/14/22 0228  WBC 11.3* 11.7*  --   --  8.2 8.4 8.1  HGB 14.4 14.5 15.3 15.5 13.7 13.2 13.7  HCT 46.5 47.8 45.0 50.0 43.7 41.8 45.4  PLT 145* 155  --   --  116* 76* 89*  MCV 102.6* 102.1*  --   --  100.0 98.6 102.0*  MCH 31.8 31.0  --   --  31.4  31.1 30.8  MCHC 31.0 30.3  --   --  31.4 31.6 30.2  RDW 16.3* 16.2*  --   --  15.9* 15.9* 16.0*  LYMPHSABS  --   --   --   --   --  0.7 0.9  MONOABS  --   --   --   --   --  0.8 0.8  EOSABS  --   --   --   --   --  0.1 0.1  BASOSABS  --   --   --   --   --  0.0 0.0    Recent Labs  Lab 2023/01/01 1928 01-Jan-2023 1938 01/01/23 2215 12/11/22 0017 12/11/22 0344 12/11/22 0710 12/11/22 0823 12/11/22 0830 12/11/22 1238 12/11/22 1918 12/12/22 0611  12/13/22 0752 12/14/22 0228  NA 156*   < >  --   --  150*  --  152* 152*  --  150* 150* 146* 144  K 4.6   < >  --   --  4.1  --  4.3 4.2  --   --  3.7 3.6 4.1  CL 118*   < >  --   --  114*  --  118*  --   --   --  114* 114* 109  CO2 21*  --   --   --  22  --  21*  --   --   --  23 21* 26  ANIONGAP 17*  --   --   --  14  --  13  --   --   --  13 11 9   GLUCOSE 134*   < >  --   --  167*  --  165*  --   --   --  163* 147* 124*  BUN 44*   < >  --   --  44*  --  44*  --   --   --  48* 39* 34*  CREATININE 2.15*   < >  --   --  1.85*  --  2.04*  --   --   --  1.92* 1.52* 1.45*  AST 327*  --   --   --  333*  --  376*  --   --   --  165* 149* 160*  ALT 173*  --   --   --  196*  --  231*  --   --   --  187* 172* 199*  ALKPHOS 109  --   --   --  120  --  121  --   --   --  99 95 93  BILITOT 3.9*  --   --   --  3.3*  --  3.1*  --   --   --  2.0* 1.5* 1.1  ALBUMIN 3.0*  --   --   --  2.8*  --  2.9*  --   --   --  2.6* 2.2* 2.4*  CRP  --   --   --   --   --   --   --   --   --   --  7.2* 4.9* 4.1*  PROCALCITON  --   --   --   --   --   --   --   --   --   --  0.18 0.20 <0.10  LATICACIDVEN  --   --  4.1* 3.5*  --  4.5*  --   --  2.6*  --   --   --   --   INR 1.7*  --   --   --   --   --   --   --   --   --   --   --   --   HGBA1C  --   --   --   --  5.6  --   --   --   --   --   --   --   --   AMMONIA  --   --   --   --   --   --   --   --   --  20 52*  --   --   BNP 1,420.9*  --   --   --   --   --   --   --   --   --  911.9*  --   --   MG 2.2  --   --   --   --   --   --   --   --   --  1.8 2.2 2.0  CALCIUM 8.8*  --   --   --  8.6*  --  8.6*  --   --   --  8.4* 7.9* 7.9*   < > = values in this interval not displayed.    Recent Labs  Lab Dec 22, 2022 1928 22-Dec-2022 2215 12/11/22 0017 12/11/22 0344 12/11/22 0710 12/11/22 4098 12/11/22 1238 12/11/22 1918 12/12/22 0611 12/13/22 0752 12/14/22 0228  CRP  --   --   --   --   --   --   --   --  7.2* 4.9* 4.1*  PROCALCITON  --   --   --   --   --   --    --   --  0.18 0.20 <0.10  LATICACIDVEN  --  4.1* 3.5*  --  4.5*  --  2.6*  --   --   --   --   INR 1.7*  --   --   --   --   --   --   --   --   --   --   HGBA1C  --   --   --  5.6  --   --   --   --   --   --   --   AMMONIA  --   --   --   --   --   --   --  20 52*  --   --   BNP 1,420.9*  --   --   --   --   --   --   --  911.9*  --   --   MG 2.2  --   --   --   --   --   --   --  1.8 2.2 2.0  CALCIUM 8.8*  --   --  8.6*  --  8.6*  --   --  8.4* 7.9* 7.9*     Recent Results (from the past 240 hour(s))  Resp panel by RT-PCR (RSV, Flu A&B, Covid) Anterior Nasal Swab     Status: None   Collection Time: 12-22-22  7:30 PM   Specimen: Anterior Nasal Swab  Result Value Ref Range Status   SARS Coronavirus 2 by RT PCR NEGATIVE NEGATIVE Final   Influenza A by PCR NEGATIVE NEGATIVE Final  Influenza B by PCR NEGATIVE NEGATIVE Final    Comment: (NOTE) The Xpert Xpress SARS-CoV-2/FLU/RSV plus assay is intended as an aid in the diagnosis of influenza from Nasopharyngeal swab specimens and should not be used as a sole basis for treatment. Nasal washings and aspirates are unacceptable for Xpert Xpress SARS-CoV-2/FLU/RSV testing.  Fact Sheet for Patients: BloggerCourse.com  Fact Sheet for Healthcare Providers: SeriousBroker.it  This test is not yet approved or cleared by the [place] FDA and has been authorized for detection and/or diagnosis of SARS-CoV-2 by FDA under an Emergency Use Authorization (EUA). This EUA will remain in effect (meaning this test can be used) for the duration of the COVID-19 declaration under Section 564(b)(1) of the Act, 21 U.S.C. section 360bbb-3(b)(1), unless the authorization is terminated or revoked.     Resp Syncytial Virus by PCR NEGATIVE NEGATIVE Final    Comment: (NOTE) Fact Sheet for Patients: BloggerCourse.com  Fact Sheet for Healthcare  Providers: SeriousBroker.it  This test is not yet approved or cleared by the [place] FDA and has been authorized for detection and/or diagnosis of SARS-CoV-2 by FDA under an Emergency Use Authorization (EUA). This EUA will remain in effect (meaning this test can be used) for the duration of the COVID-19 declaration under Section 564(b)(1) of the Act, 21 U.S.C. section 360bbb-3(b)(1), unless the authorization is terminated or revoked.  Performed at [facility], [address], [place], [place] [address]   Culture, blood (Routine X 2) w Reflex to ID Panel     Status: Abnormal   Collection Time: 12/11/22  6:25 AM   Specimen: BLOOD  Result Value Ref Range Status   Specimen Description BLOOD BLOOD LEFT FOREARM  Final   Special Requests   Final    BOTTLES DRAWN AEROBIC AND ANAEROBIC Blood Culture results may not be optimal due to an inadequate volume of blood received in culture bottles   Culture  Setup Time   Final    GRAM POSITIVE COCCI AEROBIC BOTTLE ONLY CRITICAL RESULT CALLED TO, READ BACK BY AND VERIFIED WITH: PHARMD EMILY S. [id number] FCP    Culture (A)  Final    GRANULICATELLA ADIACENS Standardized susceptibility testing for this organism is not available. Performed at [facility], [address] [place], [place] [address]    Report Status 12/13/2022 FINAL  Final  Blood Culture ID Panel (Reflexed)     Status: Abnormal   Collection Time: 12/11/22  6:25 AM  Result Value Ref Range Status   Enterococcus faecalis NOT DETECTED NOT DETECTED Final   Enterococcus Faecium NOT DETECTED NOT DETECTED Final   Listeria monocytogenes NOT DETECTED NOT DETECTED Final   Staphylococcus species NOT DETECTED NOT DETECTED Final   Staphylococcus aureus (BCID) NOT DETECTED NOT DETECTED Final   Staphylococcus epidermidis NOT DETECTED NOT DETECTED Final   Staphylococcus lugdunensis NOT DETECTED NOT DETECTED Final   Streptococcus species DETECTED (A)  NOT DETECTED Final    Comment: Not Enterococcus species, Streptococcus agalactiae, Streptococcus pyogenes, or Streptococcus pneumoniae. CRITICAL RESULT CALLED TO, READ BACK BY AND VERIFIED WITH: PHARMD EMILY S. [id number] FCP    Streptococcus agalactiae NOT DETECTED NOT DETECTED Final   Streptococcus pneumoniae NOT DETECTED NOT DETECTED Final   Streptococcus pyogenes NOT DETECTED NOT DETECTED Final   A.calcoaceticus-baumannii NOT DETECTED NOT DETECTED Final   Bacteroides fragilis NOT DETECTED NOT DETECTED Final   Enterobacterales NOT DETECTED NOT DETECTED Final   Enterobacter cloacae complex NOT DETECTED NOT DETECTED Final   Escherichia coli  NOT DETECTED NOT DETECTED Final   Klebsiella aerogenes NOT DETECTED NOT DETECTED Final   Klebsiella oxytoca NOT DETECTED NOT DETECTED Final   Klebsiella pneumoniae NOT DETECTED NOT DETECTED Final   Proteus species NOT DETECTED NOT DETECTED Final   Salmonella species NOT DETECTED NOT DETECTED Final   Serratia marcescens NOT DETECTED NOT DETECTED Final   Haemophilus influenzae NOT DETECTED NOT DETECTED Final   Neisseria meningitidis NOT DETECTED NOT DETECTED Final   Pseudomonas aeruginosa NOT DETECTED NOT DETECTED Final   Stenotrophomonas maltophilia NOT DETECTED NOT DETECTED Final   Candida albicans NOT DETECTED NOT DETECTED Final   Candida auris NOT DETECTED NOT DETECTED Final   Candida glabrata NOT DETECTED NOT DETECTED Final   Candida krusei NOT DETECTED NOT DETECTED Final   Candida parapsilosis NOT DETECTED NOT DETECTED Final   Candida tropicalis NOT DETECTED NOT DETECTED Final   Cryptococcus neoformans/gattii NOT DETECTED NOT DETECTED Final    Comment: Performed at [facility], [address], [place], [place] [address]  Culture, blood (Routine X 2) w Reflex to ID Panel     Status: None (Preliminary result)   Collection Time: 12/11/22  6:30 AM   Specimen: BLOOD LEFT HAND  Result Value Ref Range Status   Specimen Description BLOOD  LEFT HAND  Final   Special Requests   Final    BOTTLES DRAWN AEROBIC AND ANAEROBIC Blood Culture adequate volume   Culture   Final    NO GROWTH 3 DAYS Performed at [facility], [address], [place], [place] [address]    Report Status PENDING  Incomplete  MRSA Next Gen by PCR, Nasal     Status: None   Collection Time: 12/11/22  8:40 AM   Specimen: Nasal Mucosa; Nasal Swab  Result Value Ref Range Status   MRSA by PCR Next Gen NOT DETECTED NOT DETECTED Final    Comment: (NOTE) The GeneXpert MRSA Assay (FDA approved for NASAL specimens only), is one component of a comprehensive MRSA colonization surveillance program. It is not intended to diagnose MRSA infection nor to guide or monitor treatment for MRSA infections. Test performance is not FDA approved in patients less than 34 years old. Performed at [facility], [address] [place], [place] [address]       Radiology Studies: DG Swallowing Func-Speech Pathology  Result Date: 12/13/2022 Table formatting from the original result was not included. Modified Barium Swallow Study Patient Details Name: [name] MRN: [id number] Date of Birth: 05-22-1936 Today's Date: 12/13/2022 HPI/PMH: HPI: [name] is a 87 y.o. male with medical history significant for nonischemic cardiomyopathy, chronic HFrEF, permanent atrial fibrillation, biventricular ICD placement, hypertension, hyperlipidemia, COPD, who presents via EMS after being found down by neighbors after 2 days. Found to be hypoxic with O2 saturation of 85% on room air. Code sepsis was called in the ED. Acute pulmonary embolism seen on CT angio chest. Noncontrast head CT was nonacute.  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 Clinical Impression: Clinical Impression: Pt noted to have a kyphotic curve of cervical spine and this study did not follow the protocol of the MBS IMP (i.e sequence of consistencies presented). Solid was  administered but not recorded. Pt demonstrated mild oropharyngeal dysphagia with slow lingual motion/transit and trace lingual residue. Pharyngeal phase marked by delayed swallow initiation, reduced tongue base retraction and decreased laryngeal vestibular closure. Trace penetration of thin with minimal amount remained and flash penetration of nectar thick. Reduced  tongue base retraction led to mild-max vallecular residue mainly with puree. He had difficulty initiating a second swallow. Esophagus scanned revealing maximum retention of barium to proximal esophagus. Esophagus viewed approximately 5 minutes after study when cracker given and esophagus noted to be mostly clear. Prolonged mastication with solid. Recommend pt initiate Dys 2 texture, nectar thick liquids, multiple swallows, alternate liquids and solids and remain upright after meals. Factors that may increase risk of adverse event in presence of aspiration Rubye Oaks & Clearance Coots 2021): Factors that may increase risk of adverse event in presence of aspiration Rubye Oaks & Clearance Coots 2021): Frail or deconditioned Recommendations/Plan: Swallowing Evaluation Recommendations Swallowing Evaluation Recommendations Recommendations: PO diet PO Diet Recommendation: Dysphagia 2 (Finely chopped); Mildly thick liquids (Level 2, nectar thick) Liquid Administration via: Straw; Cup Medication Administration: Crushed with puree Supervision: Staff to assist with self-feeding Swallowing strategies  : Slow rate; Small bites/sips; Multiple dry swallows after each bite/sip; Follow solids with liquids Postural changes: Stay upright 30-60 min after meals; Position pt fully upright for meals Oral care recommendations: Oral care BID (2x/day) Treatment Plan Treatment Plan Treatment recommendations: Therapy as outlined in treatment plan below Follow-up recommendations: Skilled nursing-short term rehab (<3 hours/day) Functional status assessment: Patient has had a recent decline in their  functional status and demonstrates the ability to make significant improvements in function in a reasonable and predictable amount of time. Treatment frequency: Min 2x/week Treatment duration: 2 weeks Interventions: Patient/family education; Trials of upgraded texture/liquids; Diet toleration management by SLP Recommendations Recommendations for follow up therapy are one component of a multi-disciplinary discharge planning process, led by the attending physician.  Recommendations may be updated based on patient status, additional functional criteria and insurance authorization. Assessment: Orofacial Exam: Orofacial Exam Oral Cavity: Oral Hygiene: Xerostomia Oral Cavity - Dentition: Adequate natural dentition Orofacial Anatomy: WFL Oral Motor/Sensory Function: WFL Anatomy: Anatomy: Other (Comment) (khyphosis) Boluses Administered: Boluses Administered Boluses Administered: Thin liquids (Level 0); Mildly thick liquids (Level 2, nectar thick); Moderately thick liquids (Level 3, honey thick); Puree; Solid  Oral Impairment Domain: Oral Impairment Domain Lip Closure: No labial escape Tongue control during bolus hold: Cohesive bolus between tongue to palatal seal Bolus preparation/mastication: Slow prolonged chewing/mashing with complete recollection Bolus transport/lingual motion: Slow tongue motion Oral residue: Trace residue lining oral structures Location of oral residue : Tongue Initiation of pharyngeal swallow : Valleculae  Pharyngeal Impairment Domain: Pharyngeal Impairment Domain Soft palate elevation: No bolus between soft palate (SP)/pharyngeal wall (PW) Laryngeal elevation: Complete superior movement of thyroid cartilage with complete approximation of arytenoids to epiglottic petiole Anterior hyoid excursion: Partial anterior movement Epiglottic movement: Complete inversion Laryngeal vestibule closure: Incomplete, narrow column air/contrast in laryngeal vestibule Pharyngeal stripping wave : Present - diminished  Pharyngeal contraction (A/P view only): N/A Pharyngoesophageal segment opening: Partial distention/partial duration, partial obstruction of flow Tongue base retraction: Wide column of contrast or air between tongue base and PPW Pharyngeal residue: Majority of contrast within or on pharyngeal structures Location of pharyngeal residue: Valleculae; Pyriform sinuses  Esophageal Impairment Domain: Esophageal Impairment Domain Esophageal clearance upright position: Minimal to no esophageal clearance Pill: No data recorded Penetration/Aspiration Scale Score: Penetration/Aspiration Scale Score 1.  Material does not enter airway: Puree; Solid; Moderately thick liquids (Level 3, honey thick) 2.  Material enters airway, remains ABOVE vocal cords then ejected out: Mildly thick liquids (Level 2, nectar thick) 3.  Material enters airway, remains ABOVE vocal cords and not ejected out: Thin liquids (Level 0) Compensatory Strategies: Compensatory Strategies Compensatory strategies: No   General  Information: Caregiver present: No  Diet Prior to this Study: NPO   Temperature : Normal   Respiratory Status: WFL   Supplemental O2: Nasal cannula   History of Recent Intubation: No  Behavior/Cognition: Alert; Cooperative; Pleasant mood; Requires cueing Self-Feeding Abilities: Needs assist with self-feeding Baseline vocal quality/speech: Normal No data recorded Volitional Swallow: Unable to elicit No data recorded Goal Planning: Prognosis for improved oropharyngeal function: Fair No data recorded No data recorded No data recorded Consulted and agree with results and recommendations: Patient Pain: Pain Assessment Pain Assessment: No/denies pain Breathing: 0 Negative Vocalization: 0 Facial Expression: 0 Body Language: 1 Consolability: 0 PAINAD Score: 1 Pain Intervention(s): Monitored during session End of Session: Start Time:SLP Start Time (ACUTE ONLY): 1316 Stop Time: SLP Stop Time (ACUTE ONLY): 1338 Time Calculation:SLP Time Calculation  (min) (ACUTE ONLY): 22 min Charges: SLP Evaluations $ SLP Speech Visit: 1 Visit SLP Evaluations $MBS Swallow: 1 Procedure SLP visit diagnosis: SLP Visit Diagnosis: Dysphagia, oropharyngeal phase (R13.12) Past Medical History: Past Medical History: Diagnosis Date  CAD (coronary artery disease)   Nonobstructive  COPD (chronic obstructive pulmonary disease) (HCC)   HLD (hyperlipidemia)   HTN (hypertension)   NICM (nonischemic cardiomyopathy) (HCC)   S/p [place] AICD placement  Permanent atrial fibrillation (HCC)  Past Surgical History: No past surgical history on file. Royce Macadamia 12/13/2022, 4:37 PM  DG Chest Port 1 View  Result Date: 12/12/2022 CLINICAL DATA:  PICC line placement EXAM: PORTABLE CHEST 1 VIEW COMPARISON:  12/12/2022 at 6:12 a.m. FINDINGS: A new right upper extremity PICC line is observed, tip projecting over the SVC. No pneumothorax or complicating feature. AICD remains in place. Atherosclerotic calcification of the aortic arch. Increased retrocardiac airspace opacity suspicious for left lower lobe atelectasis or pneumonia. Hazy density in the right infrahilar region could also reflect early pneumonia or edema. Borderline enlargement of the cardiopericardial silhouette. Mildly indistinct pulmonary vasculature. The faint right upper lobe opacity seen on the exam earlier today is less conspicuous. Degenerative glenohumeral spurring bilaterally. IMPRESSION: 1. Right upper extremity PICC line tip projects over the SVC. 2. Increased retrocardiac airspace opacity suspicious for left lower lobe atelectasis or pneumonia. 3. Hazy density in the right infrahilar region could also reflect early pneumonia or edema. 4. Borderline enlargement of the cardiopericardial silhouette. Electronically Signed   By: Gaylyn Rong M.D.   On: 12/12/2022 17:54   [place] EKG SITE RITE  Result Date: 12/12/2022 If Site Rite image not attached, placement could not be confirmed due to current cardiac rhythm.      LOS: 3 days   Signature  -    Susa Raring M.D on 12/14/2022 at 7:48 AM   -  To page go to [email or web address]

## 2022-12-14 NOTE — Plan of Care (Signed)
  Problem: Safety: Goal: Non-violent Restraint(s) Outcome: Progressing   Problem: Education: Goal: Ability to describe self-care measures that may prevent or decrease complications (Diabetes Survival Skills Education) will improve Outcome: Progressing Goal: Individualized Educational Video(s) Outcome: Progressing   Problem: Coping: Goal: Ability to adjust to condition or change in health will improve Outcome: Progressing   Problem: Fluid Volume: Goal: Ability to maintain a balanced intake and output will improve Outcome: Progressing   Problem: Health Behavior/Discharge Planning: Goal: Ability to identify and utilize available resources and services will improve Outcome: Progressing Goal: Ability to manage health-related needs will improve Outcome: Progressing   Problem: Metabolic: Goal: Ability to maintain appropriate glucose levels will improve Outcome: Progressing   Problem: Nutritional: Goal: Maintenance of adequate nutrition will improve Outcome: Progressing Goal: Progress toward achieving an optimal weight will improve Outcome: Progressing   Problem: Skin Integrity: Goal: Risk for impaired skin integrity will decrease Outcome: Progressing   Problem: Tissue Perfusion: Goal: Adequacy of tissue perfusion will improve Outcome: Progressing   Problem: Education: Goal: Knowledge of General Education information will improve Description: Including pain rating scale, medication(s)/side effects and non-pharmacologic comfort measures Outcome: Progressing   Problem: Health Behavior/Discharge Planning: Goal: Ability to manage health-related needs will improve Outcome: Progressing   Problem: Clinical Measurements: Goal: Ability to maintain clinical measurements within normal limits will improve Outcome: Progressing Goal: Will remain free from infection Outcome: Progressing Goal: Diagnostic test results will improve Outcome: Progressing Goal: Respiratory complications  will improve Outcome: Progressing Goal: Cardiovascular complication will be avoided Outcome: Progressing   Problem: Activity: Goal: Risk for activity intolerance will decrease Outcome: Progressing   Problem: Nutrition: Goal: Adequate nutrition will be maintained Outcome: Progressing   Problem: Coping: Goal: Level of anxiety will decrease Outcome: Progressing   Problem: Elimination: Goal: Will not experience complications related to bowel motility Outcome: Progressing Goal: Will not experience complications related to urinary retention Outcome: Progressing   Problem: Pain Managment: Goal: General experience of comfort will improve Outcome: Progressing   Problem: Safety: Goal: Ability to remain free from injury will improve Outcome: Progressing   Problem: Skin Integrity: Goal: Risk for impaired skin integrity will decrease Outcome: Progressing   

## 2022-12-14 NOTE — Progress Notes (Signed)
Rounding Note    Patient Name: [name] Date of Encounter: 12/14/2022  [facility] Cardiologist:  Subjective   Pt moans, does not open eyes      Inpatient Medications    Scheduled Meds:  amiodarone  200 mg Oral BID   [START ON 12/24/2022] amiodarone  200 mg Oral Daily   apixaban  2.5 mg Oral BID   Chlorhexidine Gluconate Cloth  6 each Topical Daily   hydrocerin   Topical Daily   metoprolol tartrate  25 mg Oral BID   trimethoprim-polymyxin b  2 drop Both Eyes Q4H   Continuous Infusions:  ampicillin-sulbactam (UNASYN) IV Stopped (12/14/22 0451)   PRN Meds: metoprolol tartrate, prochlorperazine, sodium chloride flush   Vital Signs    Vitals:   12/14/22 0545 12/14/22 0600 12/14/22 0615 12/14/22 0630  BP: 95/60 94/67 105/65 110/70  Pulse: 84 81 82 83  Resp: 18 17 16 17   Temp:      TempSrc:      SpO2: 95% 92%  100%  Weight:      Height:        Intake/Output Summary (Last 24 hours) at 12/14/2022 0750 Last data filed at 12/14/2022 0604 Gross per 24 hour  Intake 614.41 ml  Output 800 ml  Net -185.59 ml      12/13/2022    5:38 AM 12/11/2022    6:40 PM 2023/01/05    7:42 PM  Last 3 Weights  Weight (lbs) 167 lb 15.9 oz 158 lb 15.2 oz 170 lb  Weight (kg) 76.2 kg 72.1 kg 77.111 kg      Telemetry    Atrial fibrillation  - Personally Reviewed  ECG     No new - Personally Reviewed  Physical Exam   GEN: Frail 87 yo in acute distress.  Moans with maneuvering  Neck: JVP is not elevated  Cardiac: RRR, no murmurs, rubs, or gallops.  Respiratory: Clear to auscultation bilaterally. GI: Soft, nontender, non-distended  MS: No edema; No deformity. Neuro:  Nonfocal  Psych: Normal affect   Labs    High Sensitivity Troponin:   Recent Labs  Lab January 05, 2023 1928 2023/01/05 2215  TROPONINIHS 103* 85*     Chemistry Recent Labs  Lab 12/12/22 0611 12/13/22 0752 12/14/22 0228  NA 150* 146* 144  K 3.7 3.6 4.1  CL 114* 114* 109  CO2 23 21* 26   GLUCOSE 163* 147* 124*  BUN 48* 39* 34*  CREATININE 1.92* 1.52* 1.45*  CALCIUM 8.4* 7.9* 7.9*  MG 1.8 2.2 2.0  PROT 5.4* 5.0* 5.5*  ALBUMIN 2.6* 2.2* 2.4*  AST 165* 149* 160*  ALT 187* 172* 199*  ALKPHOS 99 95 93  BILITOT 2.0* 1.5* 1.1  GFRNONAA 34* 45* 48*  ANIONGAP 13 11 9     Lipids No results for input(s): "CHOL", "TRIG", "HDL", "LABVLDL", "LDLCALC", "CHOLHDL" in the last 168 hours.  Hematology Recent Labs  Lab 12/12/22 0611 12/13/22 0752 12/14/22 0228  WBC 8.2 8.4 8.1  RBC 4.37 4.24 4.45  HGB 13.7 13.2 13.7  HCT 43.7 41.8 45.4  MCV 100.0 98.6 102.0*  MCH 31.4 31.1 30.8  MCHC 31.4 31.6 30.2  RDW 15.9* 15.9* 16.0*  PLT 116* 76* 89*   Thyroid No results for input(s): "TSH", "FREET4" in the last 168 hours.  BNP Recent Labs  Lab 01/05/2023 1928 12/12/22 0611  BNP 1,420.9* 911.9*    DDimer No results for input(s): "DDIMER" in the last 168 hours.   Radiology   CT  Angio Chest PE W and/or Wo Contrast   Result Date: 13-Dec-2022 CLINICAL DATA:  Pulmonary embolus suspected with high probability. Fall. Sepsis. EXAM: CT ANGIOGRAPHY CHEST CT ABDOMEN AND PELVIS WITH CONTRAST TECHNIQUE: Multidetector CT imaging of the chest was performed using the standard protocol during bolus administration of intravenous contrast. Multiplanar CT image reconstructions and MIPs were obtained to evaluate the vascular anatomy. Multidetector CT imaging of the abdomen and pelvis was performed using the standard protocol during bolus administration of intravenous contrast. RADIATION DOSE REDUCTION: This exam was performed according to the departmental dose-optimization program which includes automated exposure control, adjustment of the mA and/or kV according to patient size and/or use of iterative reconstruction technique. CONTRAST:  75mL OMNIPAQUE IOHEXOL 350 MG/ML SOLN COMPARISON:  CT chest 07/25/2009 FINDINGS: CTA CHEST FINDINGS Cardiovascular: Good visualization of the central and segmental pulmonary  arteries. Filling defects are demonstrated in bilateral lower lobe subsegmental pulmonary arteries suggesting peripheral pulmonary embolus. No large central pulmonary emboli. Clot burden is very low. No contrast material is demonstrated in the left ventricle limiting evaluation of the RV to LV ratio. The cardiac chambers appear grossly symmetrical. Due to low clot burden, right heart strain is unlikely. There is diffuse cardiac enlargement with reflux of contrast material into the hepatic veins suggesting right heart failure. Normal caliber thoracic aorta. Calcification of the aorta and coronary arteries. Mediastinum/Nodes: No enlarged mediastinal, hilar, or axillary lymph nodes. Thyroid gland, trachea, and esophagus demonstrate no significant findings. Venous gas is present likely resulting from intravenous injections. Cardiac pacemaker. Lungs/Pleura: Diffuse emphysematous changes in the lungs. Bronchial wall thickening consistent with chronic bronchitis. Patchy airspace infiltrates in the right upper lung likely representing pneumonia. Aspiration could also have this appearance. No pleural effusions. No pneumothorax. Musculoskeletal: Degenerative changes in the spine and shoulders. Review of the MIP images confirms the above findings. CT ABDOMEN and PELVIS FINDINGS Hepatobiliary: No focal liver abnormality is seen. Status post cholecystectomy. No biliary dilatation. Pancreas: Unremarkable. No pancreatic ductal dilatation or surrounding inflammatory changes. Spleen: Normal in size without focal abnormality. Adrenals/Urinary Tract: No adrenal gland nodules. Nephrograms are diffusely heterogeneous and striated bilaterally, likely indicating pyelonephritis. No hydronephrosis or hydroureter. Bladder is decompressed with a Foley catheter in place. Stomach/Bowel: Stomach is within normal limits. Appendix is not identified. No evidence of bowel wall thickening, distention, or inflammatory changes. Vascular/Lymphatic:  Diffuse aortic atherosclerosis with diffuse calcification. No aneurysm or dissection. Reproductive: Prostate gland is mildly enlarged. Other: No free air or free fluid in the abdomen. Abdominal wall musculature appears intact. Musculoskeletal: Degenerative changes in the spine. Review of the MIP images confirms the above findings. IMPRESSION: 1. Positive examination for peripheral lower lobe pulmonary emboli. No significant central pulmonary embolus. Low clot burden indicates right heart strain would be unlikely. 2. Airspace disease in the right upper lung is likely pneumonia. 3. Diffuse emphysematous changes in the lungs. Chronic bronchitic changes. 4. Aortic atherosclerosis. 5. Heterogeneous striated nephrograms bilaterally likely indicate pyelonephritis. 6. Prostate gland is enlarged. Critical Value/emergent results were called by telephone at the time of interpretation on 12-13-2022 at 9:43 pm to provider Vonita Moss , who verbally acknowledged these results. Electronically Signed   By: Burman Nieves M.D.   On: December 13, 2022 21:52     Cardiac Studies    Echo  12/11/22 1. Left ventricular ejection fraction, by estimation, is 50 to 55%. The  left ventricle has low normal function. Left ventricular endocardial  border not optimally defined to evaluate regional wall motion. Left  ventricular diastolic function  could not be  evaluated.   2. Right ventricular systolic function is moderately reduced. The right  ventricular size is normal.   3. Right atrial size was severely dilated.   4. The mitral valve is normal in structure. Trivial mitral valve  regurgitation. No evidence of mitral stenosis.   5. The aortic valve is normal in structure. Aortic valve regurgitation is  not visualized. No aortic stenosis is present.   6. The inferior vena cava is dilated in size with <50% respiratory  variability, suggesting right atrial pressure of 15 mmHg.   Conclusion(s)/Recommendation(s): Technically very  limited study with poor  sound wave transmission. LV never seen well but EF appears preserved on  apica images. RV is moderately hypokientic. There is a pcing wire evident  in the coronary sinus.    Patient Profile      [name] is a 87 y.o. male with a hx of non-ischemic cardiomyopathy,  chronic systolic and diastolic heart failure, s/p biventricular ICD implant 2014, HTN, HLD, permanent A fib on Eliquis, COPD, remote tobacco use, who is being seen 12/11/2022 for the evaluation of A fib RVR at the request of Dr Caleb Popp.   Assessment & Plan    1  Atrial fibrillation  Permanent  Plan for continued rate control  Currently on amiodarone 200 bid and metoprolol 25 bid  HR controlled    BP soft at times  Follow  Continue Eliquis      2 Hx  NICM   Pt s/p BiV ICD 2014   LVEF 50 to 55% on echo here   RV function is mod reduced   Overall volume status OK     3   CAD   Nonobsttuctive by cath in the past  Trivial troponin elevation could reflect demand ischemia with illness  4   ID Pt admitted with sepsis  Pt with bacteremia   TEE requested given device    Will schedule  Follow plts      5    thrombocytopena   Plt 155 on 7/17 Yesterday 76 K  Today 89K Off of heparin (though early for this)  On Eliquis       7  Pulm  Peripheral pulm emboli      8  GI  Pt with hepatitis   Transaminases remain elevated   7  Renal  Cr 1.45 today     For questions or updates, please contact [facility] Please consult [email or web address] for contact info under        Signed, Dietrich Pates, MD  12/14/2022, 7:50 AM

## 2022-12-14 NOTE — Consult Note (Signed)
[facility]       Reason for Consult:bacteremia    Referring Physician: Dr. Thedore Mins  Principal Problem:   AMS (altered mental status) Active Problems:   Atrial fibrillation with RVR (HCC)   Multiple subsegmental pulmonary emboli without acute cor pulmonale (HCC)   Pneumonia of right lung due to infectious organism   Septic shock (HCC)   Persistent atrial fibrillation (HCC)    amiodarone  200 mg Oral BID   [START ON 12/24/2022] amiodarone  200 mg Oral Daily   apixaban  2.5 mg Oral BID   Chlorhexidine Gluconate Cloth  6 each Topical Daily   hydrocerin   Topical Daily   metoprolol tartrate  25 mg Oral BID   trimethoprim-polymyxin b  2 drop Both Eyes Q4H    Recommendations: TEE when able Continue with current antibiotics  Will repeat blood cultures.   Assessment: He has a blood culture positive associated with fever, leukocytosis and concern fro infection in the setting of an AICD.  Granulicatella associated with endocarditis so concerned for AICD-associated infection.  Will need TEE to evaluate.   HPI: [name] is a 87 y.o. male with a history of non-ischemic cardiomyopathy, reduced EF HF, Afib on Eliquis, ICD placement came in after being found down.  Was in Afib with rvr on presentation, febrile to 100.7 and with leukocytosis of 11.3.  Started on broad spectrum antibiotics then ampicillin/sulbactam with concern for aspiration and has been afebrile with normalization of the WBC.  One blood culture with Granulicatella adiacens.  Patient does not give much history.  Swallow study recommending  a dysphagia diet.    Review of Systems:  Unable to be assessed due to mental status All other systems reviewed and are negative    Past Medical History:  Diagnosis Date   CAD (coronary artery disease)    Nonobstructive   COPD (chronic obstructive pulmonary disease) (HCC)    HLD (hyperlipidemia)    HTN (hypertension)    NICM (nonischemic  cardiomyopathy) (HCC)    S/p [employer] AICD placement   Permanent atrial fibrillation (HCC)     Social History   Tobacco Use   Smoking status: Never   Smokeless tobacco: Never    History reviewed. No pertinent family history.  Allergies  Allergen Reactions   Prednisone Other (See Comments)    Psychosis    Zestril [Lisinopril]     Unknown reaction   Glucosamine Rash    Physical Exam: Constitutional: in no apparent distress,. arousable Vitals:   12/14/22 0805 12/14/22 1100  BP: (!) 121/56   Pulse: 88   Resp: 17   Temp: 98.5 F (36.9 C)   SpO2: 100% 95%   EYES: anicteric Respiratory: normal respiratory effort GI: soft Musculoskeletal: no edema  Lab Results  Component Value Date   WBC 8.1 12/14/2022   HGB 13.7 12/14/2022   HCT 45.4 12/14/2022   MCV 102.0 (H) 12/14/2022   PLT 89 (L) 12/14/2022    Lab Results  Component Value Date   CREATININE 1.45 (H) 12/14/2022   BUN 34 (H) 12/14/2022   NA 144 12/14/2022   K 4.1 12/14/2022   CL 109 12/14/2022   CO2 26 12/14/2022    Lab Results  Component Value Date   ALT 199 (H) 12/14/2022   AST 160 (H) 12/14/2022   ALKPHOS 93 12/14/2022     Microbiology: Recent Results (from the past 240 hour(s))  Resp panel by RT-PCR (RSV, Flu A&B, Covid) Anterior Nasal  Swab     Status: None   Collection Time: 12/12/2022  7:30 PM   Specimen: Anterior Nasal Swab  Result Value Ref Range Status   SARS Coronavirus 2 by RT PCR NEGATIVE NEGATIVE Final   Influenza A by PCR NEGATIVE NEGATIVE Final   Influenza B by PCR NEGATIVE NEGATIVE Final    Comment: (NOTE) The Xpert Xpress SARS-CoV-2/FLU/RSV plus assay is intended as an aid in the diagnosis of influenza from Nasopharyngeal swab specimens and should not be used as a sole basis for treatment. Nasal washings and aspirates are unacceptable for Xpert Xpress SARS-CoV-2/FLU/RSV testing.  Fact Sheet for Patients: BloggerCourse.com  Fact Sheet for Healthcare  Providers: SeriousBroker.it  This test is not yet approved or cleared by the [place] FDA and has been authorized for detection and/or diagnosis of SARS-CoV-2 by FDA under an Emergency Use Authorization (EUA). This EUA will remain in effect (meaning this test can be used) for the duration of the COVID-19 declaration under Section 564(b)(1) of the Act, 21 U.S.C. section 360bbb-3(b)(1), unless the authorization is terminated or revoked.     Resp Syncytial Virus by PCR NEGATIVE NEGATIVE Final    Comment: (NOTE) Fact Sheet for Patients: BloggerCourse.com  Fact Sheet for Healthcare Providers: SeriousBroker.it  This test is not yet approved or cleared by the [place] FDA and has been authorized for detection and/or diagnosis of SARS-CoV-2 by FDA under an Emergency Use Authorization (EUA). This EUA will remain in effect (meaning this test can be used) for the duration of the COVID-19 declaration under Section 564(b)(1) of the Act, 21 U.S.C. section 360bbb-3(b)(1), unless the authorization is terminated or revoked.  Performed at [facility], [address] [place], [place] [address]   Culture, blood (Routine X 2) w Reflex to ID Panel     Status: Abnormal   Collection Time: 12/11/22  6:25 AM   Specimen: BLOOD  Result Value Ref Range Status   Specimen Description BLOOD BLOOD LEFT FOREARM  Final   Special Requests   Final    BOTTLES DRAWN AEROBIC AND ANAEROBIC Blood Culture results may not be optimal due to an inadequate volume of blood received in culture bottles   Culture  Setup Time   Final    GRAM POSITIVE COCCI AEROBIC BOTTLE ONLY CRITICAL RESULT CALLED TO, READ BACK BY AND VERIFIED WITH: PHARMD EMILY S. [id number] FCP    Culture (A)  Final    GRANULICATELLA ADIACENS Standardized susceptibility testing for this organism is not available. Performed at [facility], [address], [place], [place] [address]    Report Status 12/13/2022 FINAL  Final  Blood Culture ID Panel (Reflexed)     Status: Abnormal   Collection Time: 12/11/22  6:25 AM  Result Value Ref Range Status   Enterococcus faecalis NOT DETECTED NOT DETECTED Final   Enterococcus Faecium NOT DETECTED NOT DETECTED Final   Listeria monocytogenes NOT DETECTED NOT DETECTED Final   Staphylococcus species NOT DETECTED NOT DETECTED Final   Staphylococcus aureus (BCID) NOT DETECTED NOT DETECTED Final   Staphylococcus epidermidis NOT DETECTED NOT DETECTED Final   Staphylococcus lugdunensis NOT DETECTED NOT DETECTED Final   Streptococcus species DETECTED (A) NOT DETECTED Final    Comment: Not Enterococcus species, Streptococcus agalactiae, Streptococcus pyogenes, or Streptococcus pneumoniae. CRITICAL RESULT CALLED TO, READ BACK BY AND VERIFIED WITH: PHARMD EMILY S. [id number] FCP    Streptococcus agalactiae NOT DETECTED NOT DETECTED Final   Streptococcus pneumoniae NOT DETECTED NOT DETECTED  Final   Streptococcus pyogenes NOT DETECTED NOT DETECTED Final   A.calcoaceticus-baumannii NOT DETECTED NOT DETECTED Final   Bacteroides fragilis NOT DETECTED NOT DETECTED Final   Enterobacterales NOT DETECTED NOT DETECTED Final   Enterobacter cloacae complex NOT DETECTED NOT DETECTED Final   Escherichia coli NOT DETECTED NOT DETECTED Final   Klebsiella aerogenes NOT DETECTED NOT DETECTED Final   Klebsiella oxytoca NOT DETECTED NOT DETECTED Final   Klebsiella pneumoniae NOT DETECTED NOT DETECTED Final   Proteus species NOT DETECTED NOT DETECTED Final   Salmonella species NOT DETECTED NOT DETECTED Final   Serratia marcescens NOT DETECTED NOT DETECTED Final   Haemophilus influenzae NOT DETECTED NOT DETECTED Final   Neisseria meningitidis NOT DETECTED NOT DETECTED Final   Pseudomonas aeruginosa NOT DETECTED NOT DETECTED Final   Stenotrophomonas maltophilia NOT DETECTED NOT DETECTED Final   Candida albicans NOT DETECTED  NOT DETECTED Final   Candida auris NOT DETECTED NOT DETECTED Final   Candida glabrata NOT DETECTED NOT DETECTED Final   Candida krusei NOT DETECTED NOT DETECTED Final   Candida parapsilosis NOT DETECTED NOT DETECTED Final   Candida tropicalis NOT DETECTED NOT DETECTED Final   Cryptococcus neoformans/gattii NOT DETECTED NOT DETECTED Final    Comment: Performed at [facility], [address] [place], [place] [address]  Culture, blood (Routine X 2) w Reflex to ID Panel     Status: None (Preliminary result)   Collection Time: 12/11/22  6:30 AM   Specimen: BLOOD LEFT HAND  Result Value Ref Range Status   Specimen Description BLOOD LEFT HAND  Final   Special Requests   Final    BOTTLES DRAWN AEROBIC AND ANAEROBIC Blood Culture adequate volume   Culture   Final    NO GROWTH 3 DAYS Performed at [facility], [address] [place], [place] [address]    Report Status PENDING  Incomplete  MRSA Next Gen by PCR, Nasal     Status: None   Collection Time: 12/11/22  8:40 AM   Specimen: Nasal Mucosa; Nasal Swab  Result Value Ref Range Status   MRSA by PCR Next Gen NOT DETECTED NOT DETECTED Final    Comment: (NOTE) The GeneXpert MRSA Assay (FDA approved for NASAL specimens only), is one component of a comprehensive MRSA colonization surveillance program. It is not intended to diagnose MRSA infection nor to guide or monitor treatment for MRSA infections. Test performance is not FDA approved in patients less than 44 years old. Performed at [facility], [address], [place], [place] [address]     Gardiner Barefoot, MD [facility] [email or web address] 12/14/2022, 12:00 PM

## 2022-12-15 DIAGNOSIS — J189 Pneumonia, unspecified organism: Secondary | ICD-10-CM | POA: Diagnosis not present

## 2022-12-15 DIAGNOSIS — I4819 Other persistent atrial fibrillation: Secondary | ICD-10-CM | POA: Diagnosis not present

## 2022-12-15 LAB — COMPREHENSIVE METABOLIC PANEL
ALT: 173 U/L — ABNORMAL HIGH (ref 0–44)
AST: 97 U/L — ABNORMAL HIGH (ref 15–41)
Albumin: 2.5 g/dL — ABNORMAL LOW (ref 3.5–5.0)
Alkaline Phosphatase: 78 U/L (ref 38–126)
Anion gap: 3 — ABNORMAL LOW (ref 5–15)
BUN: 36 mg/dL — ABNORMAL HIGH (ref 8–23)
CO2: 32 mmol/L (ref 22–32)
Calcium: 8.2 mg/dL — ABNORMAL LOW (ref 8.9–10.3)
Chloride: 108 mmol/L (ref 98–111)
Creatinine, Ser: 1.61 mg/dL — ABNORMAL HIGH (ref 0.61–1.24)
GFR, Estimated: 42 mL/min — ABNORMAL LOW (ref >60)
Glucose, Bld: 150 mg/dL — ABNORMAL HIGH (ref 70–99)
Potassium: 4.3 mmol/L (ref 3.5–5.1)
Sodium: 143 mmol/L (ref 135–145)
Total Bilirubin: 0.7 mg/dL (ref 0.3–1.2)
Total Protein: 5.7 g/dL — ABNORMAL LOW (ref 6.5–8.1)

## 2022-12-15 LAB — CBC WITH DIFFERENTIAL/PLATELET
Abs Immature Granulocytes: 0.1 10*3/uL — ABNORMAL HIGH (ref 0.00–0.07)
Basophils Absolute: 0 10*3/uL (ref 0.0–0.1)
Basophils Relative: 0 %
Eosinophils Absolute: 0 10*3/uL (ref 0.0–0.5)
Eosinophils Relative: 0 %
HCT: 45.5 % (ref 39.0–52.0)
Hemoglobin: 13.6 g/dL (ref 13.0–17.0)
Immature Granulocytes: 1 %
Lymphocytes Relative: 6 %
Lymphs Abs: 0.6 10*3/uL — ABNORMAL LOW (ref 0.7–4.0)
MCH: 31.1 pg (ref 26.0–34.0)
MCHC: 29.9 g/dL — ABNORMAL LOW (ref 30.0–36.0)
MCV: 103.9 fL — ABNORMAL HIGH (ref 80.0–100.0)
Monocytes Absolute: 1.1 10*3/uL — ABNORMAL HIGH (ref 0.1–1.0)
Monocytes Relative: 12 %
Neutro Abs: 7.3 10*3/uL (ref 1.7–7.7)
Neutrophils Relative %: 81 %
Platelets: 89 10*3/uL — ABNORMAL LOW (ref 150–400)
RBC: 4.38 MIL/uL (ref 4.22–5.81)
RDW: 15.9 % — ABNORMAL HIGH (ref 11.5–15.5)
WBC: 9.2 10*3/uL (ref 4.0–10.5)
nRBC: 0 % (ref 0.0–0.2)

## 2022-12-15 LAB — C-REACTIVE PROTEIN: CRP: 2.9 mg/dL — ABNORMAL HIGH (ref <1.0)

## 2022-12-15 LAB — TECHNOLOGIST SMEAR REVIEW: Plt Morphology: DECREASED

## 2022-12-15 LAB — GLUCOSE, CAPILLARY
Glucose-Capillary: 131 mg/dL — ABNORMAL HIGH (ref 70–99)
Glucose-Capillary: 134 mg/dL — ABNORMAL HIGH (ref 70–99)
Glucose-Capillary: 139 mg/dL — ABNORMAL HIGH (ref 70–99)
Glucose-Capillary: 140 mg/dL — ABNORMAL HIGH (ref 70–99)

## 2022-12-15 LAB — MAGNESIUM: Magnesium: 2 mg/dL (ref 1.7–2.4)

## 2022-12-15 LAB — PROCALCITONIN: Procalcitonin: 0.11 ng/mL

## 2022-12-15 MED ORDER — LORAZEPAM 2 MG/ML IJ SOLN: 1.0000 mg | INTRAMUSCULAR | Status: DC | PRN

## 2022-12-15 MED ORDER — PROSOURCE PLUS PO LIQD
30.0000 mL | Freq: Two times a day (BID) | ORAL | Status: DC
  Administered 2022-12-15: 30 mL via ORAL
  Filled 2022-12-15: qty 30

## 2022-12-15 MED ORDER — SODIUM CHLORIDE 0.9% FLUSH: 3.0000 mL | INTRAVENOUS | Status: DC | PRN

## 2022-12-15 MED ORDER — LORAZEPAM 2 MG/ML PO CONC: 1.0000 mg | ORAL | Status: DC | PRN

## 2022-12-15 MED ORDER — SODIUM CHLORIDE 0.9 % IV SOLN: 250.0000 mL | INTRAVENOUS | Status: DC | PRN

## 2022-12-15 MED ORDER — SODIUM CHLORIDE 0.9% FLUSH
3.0000 mL | Freq: Two times a day (BID) | INTRAVENOUS | Status: DC
  Administered 2022-12-15: 3 mL via INTRAVENOUS

## 2022-12-15 MED ORDER — MORPHINE SULFATE (PF) 2 MG/ML IV SOLN: 2.0000 mg | INTRAVENOUS | Status: DC | PRN

## 2022-12-15 MED ORDER — ALBUTEROL SULFATE (2.5 MG/3ML) 0.083% IN NEBU: 2.5000 mg | INHALATION_SOLUTION | RESPIRATORY_TRACT | Status: DC | PRN

## 2022-12-15 MED ORDER — ONDANSETRON HCL 4 MG/2ML IJ SOLN: 4.0000 mg | Freq: Four times a day (QID) | INTRAMUSCULAR | Status: DC | PRN

## 2022-12-15 MED ORDER — ALBUMIN HUMAN 25 % IV SOLN
50.0000 g | Freq: Once | INTRAVENOUS | Status: AC
  Administered 2022-12-15: 50 g via INTRAVENOUS
  Filled 2022-12-15: qty 200

## 2022-12-15 MED ORDER — ONDANSETRON 4 MG PO TBDP: 4.0000 mg | ORAL_TABLET | Freq: Four times a day (QID) | ORAL | Status: DC | PRN

## 2022-12-15 MED ORDER — FUROSEMIDE 40 MG PO TABS
40.0000 mg | ORAL_TABLET | Freq: Once | ORAL | Status: AC
  Administered 2022-12-15: 40 mg via ORAL
  Filled 2022-12-15: qty 1

## 2022-12-15 MED ORDER — BIOTENE DRY MOUTH MT LIQD: 15.0000 mL | OROMUCOSAL | Status: DC | PRN

## 2022-12-15 MED ORDER — ATROPINE SULFATE 1 % OP SOLN: 4.0000 [drp] | OPHTHALMIC | Status: DC | PRN

## 2022-12-15 MED ORDER — LORAZEPAM 1 MG PO TABS: 1.0000 mg | ORAL_TABLET | ORAL | Status: DC | PRN

## 2022-12-16 LAB — CULTURE, BLOOD (ROUTINE X 2)
Culture: NO GROWTH
Special Requests: ADEQUATE

## 2022-12-17 LAB — CULTURE, BLOOD (ROUTINE X 2): Culture: NO GROWTH

## 2022-12-18 LAB — CULTURE, BLOOD (ROUTINE X 2): Culture: NO GROWTH

## 2022-12-19 LAB — CULTURE, BLOOD (ROUTINE X 2)

## 2022-12-19 NOTE — Progress Notes (Signed)
Palliative Medicine Progress Note   Patient Name: [name]       Date: 12/19/2022 DOB: 10-08-1935  Age: 87 y.o. MRN#: [id number] Attending Physician: No att. providers found Primary Care Physician: Eartha Inch, MD Admit Date: 12/21/2022   HPI/Patient Profile: 87 y.o. male  with past medical history of nonischemic cardiomyopathy, HFrEF, permanent atrial fibrillation on Eliquis, history of biventricular ICD placement, COPD, hypertension, and hyperlipidemia who was brought into the ED on 12/25/2022 after being found down at home.  In the ED, patient was found to have A-fib with RVR, tachypnea, and elevated temperature with concern for sepsis.  Workup revealed evidence of right upper lobe pneumonia as well as evidence of bilateral PE.  Labs are significant for hyponatremia and AKI consistent with significant dehydration.   Palliative Medicine was consulted for goals of care.   Subjective: Chart reviewed. Creatinine improved at 1.52 and sodium improved at 146 today.  Patient assessed at bedside. Mental status seems slightly improved from yesterday. Patient is able to tell me he is in the hospital, but does not know why.  I spoke with nephew/HCPOA Myron by phone. Discussed patient's clinical condition has improved slightly, but his overall prognosis remains guarded. Discussed he remains NPO, and plan for reassessment by SLP today. Joette Catching confirms wish not to pursue a feeding tube.  Discussed that patient was evaluated by PT and their current recommendation is for rehab.    Objective:  Physical Exam Vitals reviewed.  Constitutional:      General: He is not in acute distress.    Appearance: He is ill-appearing.  Pulmonary:     Effort: Pulmonary effort is normal.  Neurological:      Mental Status: He is alert.     Comments: Oriented to person, place             Vital Signs: BP 122/82 (BP Location: Left Leg)   Pulse 97   Temp 98.3 F (36.8 C) (Oral)   Resp 16   Ht 6\' 3"  (1.905 m)   Wt 82.7 kg   SpO2 100%   BMI 22.79 kg/m  SpO2: SpO2: 100 % O2 Device: O2 Device: (S) Nasal Cannula O2 Flow Rate: O2 Flow Rate (L/min): 3 L/min    Palliative Medicine Assessment & Plan   Assessment: Principal Problem:   AMS (  altered mental status) Active Problems:   Atrial fibrillation with RVR (HCC)   Multiple subsegmental pulmonary emboli without acute cor pulmonale (HCC)   Pneumonia of right lung due to infectious organism   Septic shock (HCC)   Persistent atrial fibrillation (HCC)    Recommendations/Plan: Continue current interventions If no significant improvement in the next few days or if patient decompensates, nephew is open to transition to comfort care MOST form completed 7/18 - original given to nephew and copy made to scan into EMR PMT will continue to follow   Cardiopulmonary Resuscitation: Do Not Attempt Resuscitation (DNR/No CPR)  Medical Interventions: Limited Additional Interventions: Use medical treatment, IV fluids and cardiac monitoring as indicated, DO NOT USE intubation or mechanical ventilation. May consider use of less invasive airway support such as BiPAP or CPAP. Also provide comfort measures. Transfer to the hospital if indicated. Avoid intensive care.   Antibiotics: Determine use of limitation of antibiotics when infection occurs  IV Fluids: IV fluids for a defined trial period  Feeding Tube: No feeding tube     Primary Decision Maker: HCPOA - Yehya Brendle (nephew)  Prognosis:  guarded  Discharge Planning: To Be Determined    Thank you for allowing the Palliative Medicine Team to assist in the care of this patient.   MDM - moderate   Merry Proud, NP   Please contact Palliative Medicine Team phone at [phone] for  questions and concerns.  For individual providers, please see AMION.

## 2022-12-26 NOTE — Death Summary Note (Signed)
[facility] Death Note                                                                                                                                                                                               [name], is a 87 y.o. male, DOB - 08/17/1935, OAC:[id number]  Admit date - 12/02/2022   Admitting Physician Darlin Drop, DO  Outpatient Primary MD for the patient is Scott Inch, MD  LOS - 4  Chief Complaint  Patient presents with   Fall       Notification: Scott Inch, MD notified of death of 12-16-2022    Date and Time of Death - Dec 16, 2022 @ 15.14 pm  Pronounced by - RN  History of present illness:   [name] is a 87 y.o. male with a history of - 87 y.o. male with a history of nonischemic cardiomyopathy, chronic heart failure with reduced EF, permanent atrial fibrillation on Eliquis, history of biventricular ICD placement, hypertension, hyperlipidemia, COPD.  Patient presented secondary to being found down.  On arrival to the emergency department, patient was found to have atrial fibrillation with rapid ventricular response, tachypnea, elevated temperature with concern for sepsis.  Workup revealed evidence of right upper lobe pneumonia in addition to evidence of bilateral peripheral PE.  Labs are significant for bacteremia, hyponatremia and AKI consistent with significant dehydration.  Patient started on empiric antibiotics in addition to anticoagulation.  Hospitalization complicated by development of hematuria and persistent atrial fibrillation with RVR and hypotension, he was transitioned to full comfort care today.   Final Diagnoses:  Cause if death - Pneumonia  Signature  -    Susa Raring M.D on 16-Dec-2022 at 3:18 PM   -  To page go to [email or web address]   Total clinical and documentation time for today Under 30 minutes   Last Note    PROGRESS  NOTE    DENT PLANTZ  WFU:[id number] DOB: 05-16-1936 DOA: 12/14/2022 PCP: Scott Inch, MD   Brief Narrative:  [name] is a 87 y.o. male with a history of nonischemic cardiomyopathy, chronic heart failure with reduced EF, permanent atrial fibrillation on Eliquis, history of biventricular ICD placement, hypertension, hyperlipidemia, COPD.  Patient presented secondary to being found down.  On arrival to the emergency department, patient was found to have atrial fibrillation with rapid ventricular response, tachypnea, elevated temperature with concern for sepsis.  Workup revealed evidence of right upper lobe pneumonia in addition to evidence of bilateral peripheral PE.  Labs are significant for hyponatremia and AKI consistent with significant dehydration.  Patient started on empiric  antibiotics in addition to anticoagulation.  Hospitalization complicated by development of hematuria and persistent atrial fibrillation with RVR and hypotension.    Assessment/Plan:  Addendum - around 1.30 pm patient became profoundly bradycardic and Hypotensive, unresponsive with agonal breaths, son bedside, wishes comfort measure - all non comfort meds will be stopped.   Severe dehydration from falling down at home, inability to eat or drink for several days with AKI and hypernatremia.  Hydrate, switch fluids to D5W, PT OT, still has encephalopathy and unable to cooperate in exam or interview, prognosis still guarded, continue to monitor.  De-escalate antibiotics, UA stable, possible aspiration pneumonia for which she will be placed on Unasyn on 12/12/2022, 1 out of 4 blood cultures likely contamination.  Follow final cultures.  PT  OT and speech once he is able to cooperate.  Sepsis pathophysiology present upon admission has resolved.  Right upper lobe pneumonia with Strep GRANULICATELLA ADIACENS bacteremia -  ID following on ABX, TEE requested. Has AICD.  Acute metabolic encephalopathy, dysphagia.   Likely secondary to acute illness and likely contributed to by dehydration and hypernatremia.  CT head and C-spine nonacute, no focal deficits or headache.  Being followed by PT, OT and speech, currently on dysphagia 2 diet and nectar thick liquids, monitor.  Acute pulmonary embolism Noted on admission on CT angio of the chest.  Peripheral bilateral pulmonary emboli with low clot burden and unlikely right heart strain.  Patient is on Eliquis as an outpatient for atrial fibrillation.  Patient transition to heparin IV heparin.  Echo noted with reduction in RV systolic function.  Monitor.  Acute respiratory failure with hypoxia Secondary to evidence of pneumonia on imaging.  Patient was placed on nonrebreather mask for SpO2 in the 70s.  Now weaned off supplemental oxygen.  Respiratory failure appears to be resolved.  Permanent atrial fibrillation with RVR Patient is on metoprolol, Eliquis and digoxin as an outpatient.  Patient with RVR this admission in the setting of severe dehydration and acute PE.  Cardiology following on amiodarone drip.  Elevated AST/ALT, Hyperbilirubinemia CT abdomen/pelvis without etiology for elevations.  Shock liver due to hypotension improving, stable right upper quadrant ultrasound  AKI   Baseline creatinine of about 1-1.1 from outside records.  Extremely dehydrated, has Foley catheter continue to hydrate and monitor.  No obstruction on CT abdomen pelvis.  Elevated troponin  Troponin of 103 on admission with delta of 85.  No chest pain.  Likely demand ischemia related to fibrillation with RVR.  Chronic heart failure with reduced EF   LVEF listed as 40-45%.  Patient is status post biventricular ICD placement.  ENP elevated due to combination of chronic left and now right heart strain.  Clinically dehydrated.  Blood pressure too low for beta-blocker, ACE/ARB medications at this point.  Cardiology following.  Fall  Patient found down. CK total of 333. -PT/OT  evaluation  Hyperlipidemia  Patient is on Lipitor as an outpatient which was held on admission secondary to n.p.o. status.  Thrombocytopenia - check smear, some due to dilution, improving  QTc prolongation  Noted on EKG.  Complicated by bundle branch block.  Patient is now on amiodarone, added low-dose beta-blocker, electrolytes stable.  Monitor on telemetry.  Seen by cardiology.  Moderate protein calorie malnutrition with third spacing of fluid.  Oral protein supplements, albumin x 1, gentle Lasix.  Monitor.  Diabetes mellitus type 2  Patient with a history of diabetes mellitus.  Currently controlled with hemoglobin A1c of 5.6%.  ISS  CBG (  last 3)  Recent Labs    12/13/2022 0427 12/16/2022 0737 11/30/2022 1203  GLUCAP 140* 134* 131*      DVT prophylaxis: Heparin IV >> Eliquis Code Status:   Code Status: DNR Family Communication: Faylene Kurtz in detail phone #[phone] on 12/12/2022 >> DNR, no feeding tubes, updated 12/13/23, 12/14/22 Disposition Plan: SNF   Consultants:  Cardiology, [facility], ID, cardiology for TEE  Procedures:   CT chest abdomen pelvis.   1. Positive examination for peripheral lower lobe pulmonary emboli. No significant central pulmonary embolus. Low clot burden indicates right heart strain would be unlikely. 2. Airspace disease in the right upper lung is likely pneumonia. 3. Diffuse emphysematous changes in the lungs. Chronic bronchitic changes. 4. Aortic atherosclerosis. 5. Heterogeneous striated nephrograms bilaterally likely indicate pyelonephritis. 6. Prostate gland is enlarged.  Echocardiogram.  1. Left ventricular ejection fraction, by estimation, is 50 to 55%. The left ventricle has low normal function. Left ventricular endocardial border not optimally defined to evaluate regional wall motion. Left ventricular diastolic function could not be evaluated.  2. Right ventricular systolic function is moderately reduced. The right ventricular size is normal.  3. Right  atrial size was severely dilated.  4. The mitral valve is normal in structure. Trivial mitral valve regurgitation. No evidence of mitral stenosis.  5. The aortic valve is normal in structure. Aortic valve regurgitation is not visualized. No aortic stenosis is present.  6. The inferior vena cava is dilated in size with <50% respiratory variability, suggesting right atrial pressure of 15 mmHg. Conclusion(s)/Recommendation(s): Technically very limited study with poor sound wave transmission. LV never seen well but EF appears preserved on apica images. RV is moderately hypokientic. There is a pcing wire evident in the coronary sinus.    CT head and C-spine.  Nonacute. TEE Right upper quadrant ultrasound - non acute   Subjective:  Patient in bed sleeping, will open eyes to loud commands, denies any headache or chest pain.  Objective: BP 122/82 (BP Location: Left Leg)   Pulse 97   Temp 98.3 F (36.8 C) (Oral)   Resp 16   Ht 6\' 3"  (1.905 m)   Wt 82.7 kg   SpO2 100%   BMI 22.79 kg/m   Examination:  Minimally responsive in bed, slightly better than before, moves all 4 extremities to commands, Foley catheter in place [place].AT,PERRAL Supple Neck, No JVD,   Symmetrical Chest wall movement, Good air movement bilaterally, CTAB RRR,No Gallops, Rubs or new Murmurs,  +ve B.Sounds, Abd Soft, No tenderness,   1+ lower extremity edema    Data Reviewed: I have personally reviewed following labs and imaging studies  Recent Labs  Lab 12/11/22 0823 12/11/22 0830 12/11/22 1918 12/12/22 0611 12/13/22 0752 12/14/22 0228 12/02/2022 0410  WBC 11.7*  --   --  8.2 8.4 8.1 9.2  HGB 14.5   < > 15.5 13.7 13.2 13.7 13.6  HCT 47.8   < > 50.0 43.7 41.8 45.4 45.5  PLT 155  --   --  116* 76* 89* 89*  MCV 102.1*  --   --  100.0 98.6 102.0* 103.9*  MCH 31.0  --   --  31.4 31.1 30.8 31.1  MCHC 30.3  --   --  31.4 31.6 30.2 29.9*  RDW 16.2*  --   --  15.9* 15.9* 16.0* 15.9*  LYMPHSABS  --   --   --   --  0.7  0.9 0.6*  MONOABS  --   --   --   --  0.8 0.8 1.1*  EOSABS  --   --   --   --  0.1 0.1 0.0  BASOSABS  --   --   --   --  0.0 0.0 0.0   < > = values in this interval not displayed.    Recent Labs  Lab 11/30/2022 1928 12/24/2022 1938 12/14/2022 2215 12/11/22 0017 12/11/22 0344 12/11/22 0710 12/11/22 0823 12/11/22 0830 12/11/22 1238 12/11/22 1918 12/12/22 0611 12/13/22 0752 12/14/22 0228 01-02-23 0410  NA 156*   < >  --   --  150*  --  152* 152*  --  150* 150* 146* 144 143  K 4.6   < >  --   --  4.1  --  4.3 4.2  --   --  3.7 3.6 4.1 4.3  CL 118*   < >  --   --  114*  --  118*  --   --   --  114* 114* 109 108  CO2 21*  --   --   --  22  --  21*  --   --   --  23 21* 26 32  ANIONGAP 17*  --   --   --  14  --  13  --   --   --  13 11 9  3*  GLUCOSE 134*   < >  --   --  167*  --  165*  --   --   --  163* 147* 124* 150*  BUN 44*   < >  --   --  44*  --  44*  --   --   --  48* 39* 34* 36*  CREATININE 2.15*   < >  --   --  1.85*  --  2.04*  --   --   --  1.92* 1.52* 1.45* 1.61*  AST 327*  --   --   --  333*  --  376*  --   --   --  165* 149* 160* 97*  ALT 173*  --   --   --  196*  --  231*  --   --   --  187* 172* 199* 173*  ALKPHOS 109  --   --   --  120  --  121  --   --   --  99 95 93 78  BILITOT 3.9*  --   --   --  3.3*  --  3.1*  --   --   --  2.0* 1.5* 1.1 0.7  ALBUMIN 3.0*  --   --   --  2.8*  --  2.9*  --   --   --  2.6* 2.2* 2.4* 2.5*  CRP  --   --   --   --   --   --   --   --   --   --  7.2* 4.9* 4.1* 2.9*  PROCALCITON  --   --   --   --   --   --   --   --   --   --  0.18 0.20 <0.10 0.11  LATICACIDVEN  --   --  4.1* 3.5*  --  4.5*  --   --  2.6*  --   --   --   --   --   INR 1.7*  --   --   --   --   --   --   --   --   --   --   --   --   --  HGBA1C  --   --   --   --  5.6  --   --   --   --   --   --   --   --   --   AMMONIA  --   --   --   --   --   --   --   --   --  20 52*  --   --   --   BNP 1,420.9*  --   --   --   --   --   --   --   --   --  911.9*  --   --   --   MG  2.2  --   --   --   --   --   --   --   --   --  1.8 2.2 2.0 2.0  CALCIUM 8.8*  --   --   --  8.6*  --  8.6*  --   --   --  8.4* 7.9* 7.9* 8.2*   < > = values in this interval not displayed.    Recent Labs  Lab Jan 08, 2023 1928 01-08-23 2215 12/11/22 0017 12/11/22 0344 12/11/22 0710 12/11/22 0823 12/11/22 1238 12/11/22 1918 12/12/22 0611 12/13/22 0752 12/14/22 0228 12/03/2022 0410  CRP  --   --   --   --   --   --   --   --  7.2* 4.9* 4.1* 2.9*  PROCALCITON  --   --   --   --   --   --   --   --  0.18 0.20 <0.10 0.11  LATICACIDVEN  --  4.1* 3.5*  --  4.5*  --  2.6*  --   --   --   --   --   INR 1.7*  --   --   --   --   --   --   --   --   --   --   --   HGBA1C  --   --   --  5.6  --   --   --   --   --   --   --   --   AMMONIA  --   --   --   --   --   --   --  20 52*  --   --   --   BNP 1,420.9*  --   --   --   --   --   --   --  911.9*  --   --   --   MG 2.2  --   --   --   --   --   --   --  1.8 2.2 2.0 2.0  CALCIUM 8.8*  --   --  8.6*  --  8.6*  --   --  8.4* 7.9* 7.9* 8.2*     sodium chloride flush  3 mL Intravenous Q12H     Radiology Studies: US Abdomen Limited RUQ (LIVER/GB)  Result Date: 12/14/2022 CLINICAL DATA:  Transaminitis EXAM: ULTRASOUND ABDOMEN LIMITED RIGHT UPPER QUADRANT COMPARISON:  None Available. FINDINGS: Gallbladder: Status post cholecystectomy. Common bile duct: Diameter: 0.3 cm. Liver: No focal lesion identified. Increased parenchymal echogenicity. Portal vein is patent on color Doppler imaging with normal direction of blood flow towards the liver. Other: Right pleural effusion. IMPRESSION: 1. Status post cholecystectomy. No biliary dilatation.  2. Hepatic steatosis. 3. Right pleural effusion. Electronically Signed   By: Jearld Lesch M.D.   On: 12/14/2022 12:30   DG Swallowing Func-Speech Pathology  Result Date: 12/13/2022 Table formatting from the original result was not included. Modified Barium Swallow Study Patient Details Name: [name] MRN:  [id number] Date of Birth: 12-28-1935 Today's Date: 12/13/2022 HPI/PMH: HPI: JAHMIER WILLADSEN is a 87 y.o. male with medical history significant for nonischemic cardiomyopathy, chronic HFrEF, permanent atrial fibrillation, biventricular ICD placement, hypertension, hyperlipidemia, COPD, who presents via EMS after being found down by neighbors after 2 days. Found to be hypoxic with O2 saturation of 85% on room air. Code sepsis was called in the ED. Acute pulmonary embolism seen on CT angio chest. Noncontrast head CT was nonacute.  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 Clinical Impression: Clinical Impression: Pt noted to have a kyphotic curve of cervical spine and this study did not follow the protocol of the MBS IMP (i.e sequence of consistencies presented). Solid was administered but not recorded. Pt demonstrated mild oropharyngeal dysphagia with slow lingual motion/transit and trace lingual residue. Pharyngeal phase marked by delayed swallow initiation, reduced tongue base retraction and decreased laryngeal vestibular closure. Trace penetration of thin with minimal amount remained and flash penetration of nectar thick. Reduced tongue base retraction led to mild-max vallecular residue mainly with puree. He had difficulty initiating a second swallow. Esophagus scanned revealing maximum retention of barium to proximal esophagus. Esophagus viewed approximately 5 minutes after study when cracker given and esophagus noted to be mostly clear. Prolonged mastication with solid. Recommend pt initiate Dys 2 texture, nectar thick liquids, multiple swallows, alternate liquids and solids and remain upright after meals. Factors that may increase risk of adverse event in presence of aspiration Rubye Oaks & Clearance Coots 2021): Factors that may increase risk of adverse event in presence of aspiration Rubye Oaks & Clearance Coots 2021): Frail or deconditioned Recommendations/Plan: Swallowing Evaluation Recommendations  Swallowing Evaluation Recommendations Recommendations: PO diet PO Diet Recommendation: Dysphagia 2 (Finely chopped); Mildly thick liquids (Level 2, nectar thick) Liquid Administration via: Straw; Cup Medication Administration: Crushed with puree Supervision: Staff to assist with self-feeding Swallowing strategies  : Slow rate; Small bites/sips; Multiple dry swallows after each bite/sip; Follow solids with liquids Postural changes: Stay upright 30-60 min after meals; Position pt fully upright for meals Oral care recommendations: Oral care BID (2x/day) Treatment Plan Treatment Plan Treatment recommendations: Therapy as outlined in treatment plan below Follow-up recommendations: Skilled nursing-short term rehab (<3 hours/day) Functional status assessment: Patient has had a recent decline in their functional status and demonstrates the ability to make significant improvements in function in a reasonable and predictable amount of time. Treatment frequency: Min 2x/week Treatment duration: 2 weeks Interventions: Patient/family education; Trials of upgraded texture/liquids; Diet toleration management by SLP Recommendations Recommendations for follow up therapy are one component of a multi-disciplinary discharge planning process, led by the attending physician.  Recommendations may be updated based on patient status, additional functional criteria and insurance authorization. Assessment: Orofacial Exam: Orofacial Exam Oral Cavity: Oral Hygiene: Xerostomia Oral Cavity - Dentition: Adequate natural dentition Orofacial Anatomy: WFL Oral Motor/Sensory Function: WFL Anatomy: Anatomy: Other (Comment) (khyphosis) Boluses Administered: Boluses Administered Boluses Administered: Thin liquids (Level 0); Mildly thick liquids (Level 2, nectar thick); Moderately thick liquids (Level 3, honey thick); Puree; Solid  Oral Impairment Domain: Oral Impairment Domain Lip Closure: No labial escape Tongue control during bolus hold: Cohesive bolus  between tongue to palatal seal Bolus preparation/mastication: Slow prolonged chewing/mashing  with complete recollection Bolus transport/lingual motion: Slow tongue motion Oral residue: Trace residue lining oral structures Location of oral residue : Tongue Initiation of pharyngeal swallow : Valleculae  Pharyngeal Impairment Domain: Pharyngeal Impairment Domain Soft palate elevation: No bolus between soft palate (SP)/pharyngeal wall (PW) Laryngeal elevation: Complete superior movement of thyroid cartilage with complete approximation of arytenoids to epiglottic petiole Anterior hyoid excursion: Partial anterior movement Epiglottic movement: Complete inversion Laryngeal vestibule closure: Incomplete, narrow column air/contrast in laryngeal vestibule Pharyngeal stripping wave : Present - diminished Pharyngeal contraction (A/P view only): N/A Pharyngoesophageal segment opening: Partial distention/partial duration, partial obstruction of flow Tongue base retraction: Wide column of contrast or air between tongue base and PPW Pharyngeal residue: Majority of contrast within or on pharyngeal structures Location of pharyngeal residue: Valleculae; Pyriform sinuses  Esophageal Impairment Domain: Esophageal Impairment Domain Esophageal clearance upright position: Minimal to no esophageal clearance Pill: No data recorded Penetration/Aspiration Scale Score: Penetration/Aspiration Scale Score 1.  Material does not enter airway: Puree; Solid; Moderately thick liquids (Level 3, honey thick) 2.  Material enters airway, remains ABOVE vocal cords then ejected out: Mildly thick liquids (Level 2, nectar thick) 3.  Material enters airway, remains ABOVE vocal cords and not ejected out: Thin liquids (Level 0) Compensatory Strategies: Compensatory Strategies Compensatory strategies: No   General Information: Caregiver present: No  Diet Prior to this Study: NPO   Temperature : Normal   Respiratory Status: WFL   Supplemental O2: Nasal cannula    History of Recent Intubation: No  Behavior/Cognition: Alert; Cooperative; Pleasant mood; Requires cueing Self-Feeding Abilities: Needs assist with self-feeding Baseline vocal quality/speech: Normal No data recorded Volitional Swallow: Unable to elicit No data recorded Goal Planning: Prognosis for improved oropharyngeal function: Fair No data recorded No data recorded No data recorded Consulted and agree with results and recommendations: Patient Pain: Pain Assessment Pain Assessment: No/denies pain Breathing: 0 Negative Vocalization: 0 Facial Expression: 0 Body Language: 1 Consolability: 0 PAINAD Score: 1 Pain Intervention(s): Monitored during session End of Session: Start Time:SLP Start Time (ACUTE ONLY): 1316 Stop Time: SLP Stop Time (ACUTE ONLY): 1338 Time Calculation:SLP Time Calculation (min) (ACUTE ONLY): 22 min Charges: SLP Evaluations $ SLP Speech Visit: 1 Visit SLP Evaluations $MBS Swallow: 1 Procedure SLP visit diagnosis: SLP Visit Diagnosis: Dysphagia, oropharyngeal phase (R13.12) Past Medical History: Past Medical History: Diagnosis Date  CAD (coronary artery disease)   Nonobstructive  COPD (chronic obstructive pulmonary disease) (HCC)   HLD (hyperlipidemia)   HTN (hypertension)   NICM (nonischemic cardiomyopathy) (HCC)   S/p [employer]ude AICD placement  Permanent atrial fibrillation (HCC)  Past Surgical History: No past surgical history on file. Royce Macadamia 12/13/2022, 4:37 PM     LOS: 4 days   Signature  -    Susa Raring M.D on December 23, 2022 at 3:18 PM   -  To page go to [email or web address]

## 2022-12-26 NOTE — Progress Notes (Addendum)
RT called by RN to come to bedside due to pt desaturation to 60's while on venti mask. RT instructed RN to place pt on 15L NRB as RT was headed to bedside. RT at bedside to find pt on 15L NRB w/agonal respirations. RN and family at bedside during this time. RT attempted to obtain SpO2 but could not get a reading. RT felt faint carotid pulse and instructed RN to call MD. MD made aware of pt's condition. Per MD, verbally, pt was placed on nasal cannula.

## 2022-12-26 NOTE — Progress Notes (Signed)
   Note plans to transition to comfort care.  Pt with [employer] BiV ICD    I will contact rep to turn device off.      Signed, Dietrich Pates, MD  12/09/2022, 2:42 PM

## 2022-12-26 NOTE — Progress Notes (Addendum)
PROGRESS NOTE    [name]  WUJ:[id number] DOB: [birth year] DOA: 12-25-22 PCP: Eartha Inch, MD   Brief Narrative:  [name] is a 87 y.o. male with a history of nonischemic cardiomyopathy, chronic heart failure with reduced EF, permanent atrial fibrillation on Eliquis, history of biventricular ICD placement, hypertension, hyperlipidemia, COPD.  Patient presented secondary to being found down.  On arrival to the emergency department, patient was found to have atrial fibrillation with rapid ventricular response, tachypnea, elevated temperature with concern for sepsis.  Workup revealed evidence of right upper lobe pneumonia in addition to evidence of bilateral peripheral PE.  Labs are significant for hyponatremia and AKI consistent with significant dehydration.  Patient started on empiric antibiotics in addition to anticoagulation.  Hospitalization complicated by development of hematuria and persistent atrial fibrillation with RVR and hypotension.    Assessment/Plan:  Addendum - around 1.30 pm patient became profoundly bradycardic and Hypotensive, unresponsive with agonal breaths, son bedside, wishes comfort measure - all non comfort meds will be stopped.   Severe dehydration from falling down at home, inability to eat or drink for several days with AKI and hypernatremia.  Hydrate, switch fluids to D5W, PT OT, still has encephalopathy and unable to cooperate in exam or interview, prognosis still guarded, continue to monitor.  De-escalate antibiotics, UA stable, possible aspiration pneumonia for which she will be placed on Unasyn on 12/12/2022, 1 out of 4 blood cultures likely contamination.  Follow final cultures.  PT  OT and speech once he is able to cooperate.  Sepsis pathophysiology present upon admission has resolved.  Right upper lobe pneumonia with Strep GRANULICATELLA ADIACENS bacteremia -  ID following on ABX, TEE requested. Has AICD.  Acute metabolic encephalopathy,  dysphagia.  Likely secondary to acute illness and likely contributed to by dehydration and hypernatremia.  CT head and C-spine nonacute, no focal deficits or headache.  Being followed by PT, OT and speech, currently on dysphagia 2 diet and nectar thick liquids, monitor.  Acute pulmonary embolism Noted on admission on CT angio of the chest.  Peripheral bilateral pulmonary emboli with low clot burden and unlikely right heart strain.  Patient is on Eliquis as an outpatient for atrial fibrillation.  Patient transition to heparin IV heparin.  Echo noted with reduction in RV systolic function.  Monitor.  Acute respiratory failure with hypoxia Secondary to evidence of pneumonia on imaging.  Patient was placed on nonrebreather mask for SpO2 in the 70s.  Now weaned off supplemental oxygen.  Respiratory failure appears to be resolved.  Permanent atrial fibrillation with RVR Patient is on metoprolol, Eliquis and digoxin as an outpatient.  Patient with RVR this admission in the setting of severe dehydration and acute PE.  Cardiology following on amiodarone drip.  Elevated AST/ALT, Hyperbilirubinemia CT abdomen/pelvis without etiology for elevations.  Shock liver due to hypotension improving, stable right upper quadrant ultrasound  AKI   Baseline creatinine of about 1-1.1 from outside records.  Extremely dehydrated, has Foley catheter continue to hydrate and monitor.  No obstruction on CT abdomen pelvis.  Elevated troponin  Troponin of 103 on admission with delta of 85.  No chest pain.  Likely demand ischemia related to fibrillation with RVR.  Chronic heart failure with reduced EF   LVEF listed as 40-45%.  Patient is status post biventricular ICD placement.  ENP elevated due to combination of chronic left and now right heart strain.  Clinically dehydrated.  Blood pressure too low for beta-blocker, ACE/ARB medications at  this point.  Cardiology following.  Fall  Patient found down. CK total of 333. -PT/OT  evaluation  Hyperlipidemia  Patient is on Lipitor as an outpatient which was held on admission secondary to n.p.o. status.  Thrombocytopenia - check smear, some due to dilution, improving  QTc prolongation  Noted on EKG.  Complicated by bundle branch block.  Patient is now on amiodarone, added low-dose beta-blocker, electrolytes stable.  Monitor on telemetry.  Seen by cardiology.  Moderate protein calorie malnutrition with third spacing of fluid.  Oral protein supplements, albumin x 1, gentle Lasix.  Monitor.  Diabetes mellitus type 2  Patient with a history of diabetes mellitus.  Currently controlled with hemoglobin A1c of 5.6%.  ISS  CBG (last 3)  Recent Labs    02-Jan-2023 0022 01/02/23 0427 2023/01/02 0737  GLUCAP 139* 140* 134*      DVT prophylaxis: Heparin IV >> Eliquis Code Status:   Code Status: DNR Family Communication: [name] in detail phone #[phone] on 12/12/2022 >> DNR, no feeding tubes, updated 12/13/23, 12/14/22 Disposition Plan: SNF   Consultants:  Cardiology, [facility], ID, cardiology for TEE  Procedures:   CT chest abdomen pelvis.   1. Positive examination for peripheral lower lobe pulmonary emboli. No significant central pulmonary embolus. Low clot burden indicates right heart strain would be unlikely. 2. Airspace disease in the right upper lung is likely pneumonia. 3. Diffuse emphysematous changes in the lungs. Chronic bronchitic changes. 4. Aortic atherosclerosis. 5. Heterogeneous striated nephrograms bilaterally likely indicate pyelonephritis. 6. Prostate gland is enlarged.  Echocardiogram.  1. Left ventricular ejection fraction, by estimation, is 50 to 55%. The left ventricle has low normal function. Left ventricular endocardial border not optimally defined to evaluate regional wall motion. Left ventricular diastolic function could not be evaluated.  2. Right ventricular systolic function is moderately reduced. The right ventricular size is normal.  3. Right  atrial size was severely dilated.  4. The mitral valve is normal in structure. Trivial mitral valve regurgitation. No evidence of mitral stenosis.  5. The aortic valve is normal in structure. Aortic valve regurgitation is not visualized. No aortic stenosis is present.  6. The inferior vena cava is dilated in size with <50% respiratory variability, suggesting right atrial pressure of 15 mmHg. Conclusion(s)/Recommendation(s): Technically very limited study with poor sound wave transmission. LV never seen well but EF appears preserved on apica images. RV is moderately hypokientic. There is a pcing wire evident in the coronary sinus.    CT head and C-spine.  Nonacute. TEE Right upper quadrant ultrasound - non acute   Subjective:  Patient in bed sleeping, will open eyes to loud commands, denies any headache or chest pain.  Objective: BP 116/72   Pulse (!) 104   Temp 97.6 F (36.4 C) (Axillary)   Resp (!) 22   Ht 6\' 3"  (1.905 m)   Wt 82.7 kg   SpO2 99%   BMI 22.79 kg/m   Examination:  Minimally responsive in bed, slightly better than before, moves all 4 extremities to commands, Foley catheter in place [name],PERRAL Supple Neck, No JVD,   Symmetrical Chest wall movement, Good air movement bilaterally, CTAB RRR,No Gallops, Rubs or new Murmurs,  +ve B.Sounds, Abd Soft, No tenderness,   1+ lower extremity edema    Data Reviewed: I have personally reviewed following labs and imaging studies  Recent Labs  Lab 12/11/22 0823 12/11/22 0830 12/11/22 1918 12/12/22 0611 12/13/22 0752 12/14/22 0228 02-Jan-2023 0410  WBC 11.7*  --   --  8.2 8.4 8.1 9.2  HGB 14.5   < > 15.5 13.7 13.2 13.7 13.6  HCT 47.8   < > 50.0 43.7 41.8 45.4 45.5  PLT 155  --   --  116* 76* 89* 89*  MCV 102.1*  --   --  100.0 98.6 102.0* 103.9*  MCH 31.0  --   --  31.4 31.1 30.8 31.1  MCHC 30.3  --   --  31.4 31.6 30.2 29.9*  RDW 16.2*  --   --  15.9* 15.9* 16.0* 15.9*  LYMPHSABS  --   --   --   --  0.7 0.9 0.6*   MONOABS  --   --   --   --  0.8 0.8 1.1*  EOSABS  --   --   --   --  0.1 0.1 0.0  BASOSABS  --   --   --   --  0.0 0.0 0.0   < > = values in this interval not displayed.    Recent Labs  Lab 12/20/2022 1928 12/16/2022 1938 12/04/2022 2215 12/11/22 0017 12/11/22 0344 12/11/22 0710 12/11/22 0823 12/11/22 0830 12/11/22 1238 12/11/22 1918 12/12/22 0611 12/13/22 0752 12/14/22 0228 12/30/22 0410  NA 156*   < >  --   --  150*  --  152* 152*  --  150* 150* 146* 144 143  K 4.6   < >  --   --  4.1  --  4.3 4.2  --   --  3.7 3.6 4.1 4.3  CL 118*   < >  --   --  114*  --  118*  --   --   --  114* 114* 109 108  CO2 21*  --   --   --  22  --  21*  --   --   --  23 21* 26 32  ANIONGAP 17*  --   --   --  14  --  13  --   --   --  13 11 9  3*  GLUCOSE 134*   < >  --   --  167*  --  165*  --   --   --  163* 147* 124* 150*  BUN 44*   < >  --   --  44*  --  44*  --   --   --  48* 39* 34* 36*  CREATININE 2.15*   < >  --   --  1.85*  --  2.04*  --   --   --  1.92* 1.52* 1.45* 1.61*  AST 327*  --   --   --  333*  --  376*  --   --   --  165* 149* 160* 97*  ALT 173*  --   --   --  196*  --  231*  --   --   --  187* 172* 199* 173*  ALKPHOS 109  --   --   --  120  --  121  --   --   --  99 95 93 78  BILITOT 3.9*  --   --   --  3.3*  --  3.1*  --   --   --  2.0* 1.5* 1.1 0.7  ALBUMIN 3.0*  --   --   --  2.8*  --  2.9*  --   --   --  2.6* 2.2* 2.4* 2.5*  CRP  --   --   --   --   --   --   --   --   --   --  7.2* 4.9* 4.1* 2.9*  PROCALCITON  --   --   --   --   --   --   --   --   --   --  0.18 0.20 <0.10 0.11  LATICACIDVEN  --   --  4.1* 3.5*  --  4.5*  --   --  2.6*  --   --   --   --   --   INR 1.7*  --   --   --   --   --   --   --   --   --   --   --   --   --   HGBA1C  --   --   --   --  5.6  --   --   --   --   --   --   --   --   --   AMMONIA  --   --   --   --   --   --   --   --   --  20 52*  --   --   --   BNP 1,420.9*  --   --   --   --   --   --   --   --   --  911.9*  --   --   --   MG 2.2  --    --   --   --   --   --   --   --   --  1.8 2.2 2.0 2.0  CALCIUM 8.8*  --   --   --  8.6*  --  8.6*  --   --   --  8.4* 7.9* 7.9* 8.2*   < > = values in this interval not displayed.    Recent Labs  Lab 12/18/2022 1928 12/25/2022 2215 12/11/22 0017 12/11/22 0344 12/11/22 0710 12/11/22 0823 12/11/22 1238 12/11/22 1918 12/12/22 0611 12/13/22 0752 12/14/22 0228 12/16/22 0410  CRP  --   --   --   --   --   --   --   --  7.2* 4.9* 4.1* 2.9*  PROCALCITON  --   --   --   --   --   --   --   --  0.18 0.20 <0.10 0.11  LATICACIDVEN  --  4.1* 3.5*  --  4.5*  --  2.6*  --   --   --   --   --   INR 1.7*  --   --   --   --   --   --   --   --   --   --   --   HGBA1C  --   --   --  5.6  --   --   --   --   --   --   --   --   AMMONIA  --   --   --   --   --   --   --  20 52*  --   --   --   BNP 1,420.9*  --   --   --   --   --   --   --  911.9*  --   --   --   MG 2.2  --   --   --   --   --   --   --  1.8 2.2 2.0 2.0  CALCIUM 8.8*  --   --  8.6*  --  8.6*  --   --  8.4* 7.9* 7.9* 8.2*     (feeding supplement) PROSource Plus  30 mL Oral BID BM   amiodarone  200 mg Oral BID   [START ON 12/24/2022] amiodarone  200 mg Oral Daily   apixaban  5 mg Oral BID   Chlorhexidine Gluconate Cloth  6 each Topical Daily   furosemide  40 mg Oral Once   hydrocerin   Topical Daily   metoprolol tartrate  25 mg Oral BID   trimethoprim-polymyxin b  2 drop Both Eyes Q4H     Radiology Studies: US Abdomen Limited RUQ (LIVER/GB)  Result Date: 12/14/2022 CLINICAL DATA:  Transaminitis EXAM: ULTRASOUND ABDOMEN LIMITED RIGHT UPPER QUADRANT COMPARISON:  None Available. FINDINGS: Gallbladder: Status post cholecystectomy. Common bile duct: Diameter: 0.3 cm. Liver: No focal lesion identified. Increased parenchymal echogenicity. Portal vein is patent on color Doppler imaging with normal direction of blood flow towards the liver. Other: Right pleural effusion. IMPRESSION: 1. Status post cholecystectomy. No biliary dilatation. 2.  Hepatic steatosis. 3. Right pleural effusion. Electronically Signed   By: Jearld Lesch M.D.   On: 12/14/2022 12:30   DG Swallowing Func-Speech Pathology  Result Date: 12/13/2022 Table formatting from the original result was not included. Modified Barium Swallow Study Patient Details Name: [name] MRN: [id number] Date of Birth: 08-13-1935 Today's Date: 12/13/2022 HPI/PMH: HPI: AUTUMN GUNN is a 87 y.o. male with medical history significant for nonischemic cardiomyopathy, chronic HFrEF, permanent atrial fibrillation, biventricular ICD placement, hypertension, hyperlipidemia, COPD, who presents via EMS after being found down by neighbors after 2 days. Found to be hypoxic with O2 saturation of 85% on room air. Code sepsis was called in the ED. Acute pulmonary embolism seen on CT angio chest. Noncontrast head CT was nonacute.  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 Clinical Impression: Clinical Impression: Pt noted to have a kyphotic curve of cervical spine and this study did not follow the protocol of the MBS IMP (i.e sequence of consistencies presented). Solid was administered but not recorded. Pt demonstrated mild oropharyngeal dysphagia with slow lingual motion/transit and trace lingual residue. Pharyngeal phase marked by delayed swallow initiation, reduced tongue base retraction and decreased laryngeal vestibular closure. Trace penetration of thin with minimal amount remained and flash penetration of nectar thick. Reduced tongue base retraction led to mild-max vallecular residue mainly with puree. He had difficulty initiating a second swallow. Esophagus scanned revealing maximum retention of barium to proximal esophagus. Esophagus viewed approximately 5 minutes after study when cracker given and esophagus noted to be mostly clear. Prolonged mastication with solid. Recommend pt initiate Dys 2 texture, nectar thick liquids, multiple swallows, alternate liquids and  solids and remain upright after meals. Factors that may increase risk of adverse event in presence of aspiration Rubye Oaks & Clearance Coots 2021): Factors that may increase risk of adverse event in presence of aspiration Rubye Oaks & Clearance Coots 2021): Frail or deconditioned Recommendations/Plan: Swallowing Evaluation Recommendations Swallowing Evaluation Recommendations Recommendations: PO diet PO Diet Recommendation: Dysphagia 2 (Finely chopped); Mildly thick liquids (Level 2, nectar thick) Liquid Administration via: Straw; Cup Medication Administration: Crushed with puree Supervision: Staff to assist with self-feeding Swallowing strategies  : Slow rate; Small bites/sips; Multiple dry swallows after each bite/sip; Follow solids with liquids Postural changes: Stay upright 30-60 min after meals; Position pt fully upright for meals Oral care recommendations: Oral care BID (2x/day) Treatment  Plan Treatment Plan Treatment recommendations: Therapy as outlined in treatment plan below Follow-up recommendations: Skilled nursing-short term rehab (<3 hours/day) Functional status assessment: Patient has had a recent decline in their functional status and demonstrates the ability to make significant improvements in function in a reasonable and predictable amount of time. Treatment frequency: Min 2x/week Treatment duration: 2 weeks Interventions: Patient/family education; Trials of upgraded texture/liquids; Diet toleration management by SLP Recommendations Recommendations for follow up therapy are one component of a multi-disciplinary discharge planning process, led by the attending physician.  Recommendations may be updated based on patient status, additional functional criteria and insurance authorization. Assessment: Orofacial Exam: Orofacial Exam Oral Cavity: Oral Hygiene: Xerostomia Oral Cavity - Dentition: Adequate natural dentition Orofacial Anatomy: WFL Oral Motor/Sensory Function: WFL Anatomy: Anatomy: Other (Comment) (khyphosis)  Boluses Administered: Boluses Administered Boluses Administered: Thin liquids (Level 0); Mildly thick liquids (Level 2, nectar thick); Moderately thick liquids (Level 3, honey thick); Puree; Solid  Oral Impairment Domain: Oral Impairment Domain Lip Closure: No labial escape Tongue control during bolus hold: Cohesive bolus between tongue to palatal seal Bolus preparation/mastication: Slow prolonged chewing/mashing with complete recollection Bolus transport/lingual motion: Slow tongue motion Oral residue: Trace residue lining oral structures Location of oral residue : Tongue Initiation of pharyngeal swallow : Valleculae  Pharyngeal Impairment Domain: Pharyngeal Impairment Domain Soft palate elevation: No bolus between soft palate (SP)/pharyngeal wall (PW) Laryngeal elevation: Complete superior movement of thyroid cartilage with complete approximation of arytenoids to epiglottic petiole Anterior hyoid excursion: Partial anterior movement Epiglottic movement: Complete inversion Laryngeal vestibule closure: Incomplete, narrow column air/contrast in laryngeal vestibule Pharyngeal stripping wave : Present - diminished Pharyngeal contraction (A/P view only): N/A Pharyngoesophageal segment opening: Partial distention/partial duration, partial obstruction of flow Tongue base retraction: Wide column of contrast or air between tongue base and PPW Pharyngeal residue: Majority of contrast within or on pharyngeal structures Location of pharyngeal residue: Valleculae; Pyriform sinuses  Esophageal Impairment Domain: Esophageal Impairment Domain Esophageal clearance upright position: Minimal to no esophageal clearance Pill: No data recorded Penetration/Aspiration Scale Score: Penetration/Aspiration Scale Score 1.  Material does not enter airway: Puree; Solid; Moderately thick liquids (Level 3, honey thick) 2.  Material enters airway, remains ABOVE vocal cords then ejected out: Mildly thick liquids (Level 2, nectar thick) 3.  Material  enters airway, remains ABOVE vocal cords and not ejected out: Thin liquids (Level 0) Compensatory Strategies: Compensatory Strategies Compensatory strategies: No   General Information: Caregiver present: No  Diet Prior to this Study: NPO   Temperature : Normal   Respiratory Status: WFL   Supplemental O2: Nasal cannula   History of Recent Intubation: No  Behavior/Cognition: Alert; Cooperative; Pleasant mood; Requires cueing Self-Feeding Abilities: Needs assist with self-feeding Baseline vocal quality/speech: Normal No data recorded Volitional Swallow: Unable to elicit No data recorded Goal Planning: Prognosis for improved oropharyngeal function: Fair No data recorded No data recorded No data recorded Consulted and agree with results and recommendations: Patient Pain: Pain Assessment Pain Assessment: No/denies pain Breathing: 0 Negative Vocalization: 0 Facial Expression: 0 Body Language: 1 Consolability: 0 PAINAD Score: 1 Pain Intervention(s): Monitored during session End of Session: Start Time:SLP Start Time (ACUTE ONLY): 1316 Stop Time: SLP Stop Time (ACUTE ONLY): 1338 Time Calculation:SLP Time Calculation (min) (ACUTE ONLY): 22 min Charges: SLP Evaluations $ SLP Speech Visit: 1 Visit SLP Evaluations $MBS Swallow: 1 Procedure SLP visit diagnosis: SLP Visit Diagnosis: Dysphagia, oropharyngeal phase (R13.12) Past Medical History: Past Medical History: Diagnosis Date  CAD (coronary artery disease)   Nonobstructive  COPD (chronic obstructive pulmonary disease) (HCC)   HLD (hyperlipidemia)   HTN (hypertension)   NICM (nonischemic cardiomyopathy) (HCC)   S/p [employer] AICD placement  Permanent atrial fibrillation [facility])  Past Surgical History: No past surgical history on file. Royce Macadamia 12/13/2022, 4:37 PM     LOS: 4 days   Signature  -    Susa Raring M.D on 12/07/2022 at 8:16 AM   -  To page go to [email or web address]

## 2022-12-26 NOTE — Plan of Care (Signed)
  Problem: Safety: Goal: Non-violent Restraint(s) Outcome: Progressing   Problem: Education: Goal: Ability to describe self-care measures that may prevent or decrease complications (Diabetes Survival Skills Education) will improve Outcome: Progressing Goal: Individualized Educational Video(s) Outcome: Progressing   Problem: Coping: Goal: Ability to adjust to condition or change in health will improve Outcome: Progressing   Problem: Fluid Volume: Goal: Ability to maintain a balanced intake and output will improve Outcome: Progressing   Problem: Health Behavior/Discharge Planning: Goal: Ability to identify and utilize available resources and services will improve Outcome: Progressing Goal: Ability to manage health-related needs will improve Outcome: Progressing   Problem: Metabolic: Goal: Ability to maintain appropriate glucose levels will improve Outcome: Progressing   Problem: Nutritional: Goal: Maintenance of adequate nutrition will improve Outcome: Progressing Goal: Progress toward achieving an optimal weight will improve Outcome: Progressing   Problem: Skin Integrity: Goal: Risk for impaired skin integrity will decrease Outcome: Progressing   Problem: Tissue Perfusion: Goal: Adequacy of tissue perfusion will improve Outcome: Progressing   Problem: Education: Goal: Knowledge of General Education information will improve Description: Including pain rating scale, medication(s)/side effects and non-pharmacologic comfort measures Outcome: Progressing   Problem: Health Behavior/Discharge Planning: Goal: Ability to manage health-related needs will improve Outcome: Progressing   Problem: Clinical Measurements: Goal: Ability to maintain clinical measurements within normal limits will improve Outcome: Progressing Goal: Will remain free from infection Outcome: Progressing Goal: Diagnostic test results will improve Outcome: Progressing Goal: Respiratory complications  will improve Outcome: Progressing Goal: Cardiovascular complication will be avoided Outcome: Progressing   Problem: Activity: Goal: Risk for activity intolerance will decrease Outcome: Progressing   Problem: Nutrition: Goal: Adequate nutrition will be maintained Outcome: Progressing   Problem: Coping: Goal: Level of anxiety will decrease Outcome: Progressing   Problem: Elimination: Goal: Will not experience complications related to bowel motility Outcome: Progressing Goal: Will not experience complications related to urinary retention Outcome: Progressing   Problem: Pain Managment: Goal: General experience of comfort will improve Outcome: Progressing   Problem: Safety: Goal: Ability to remain free from injury will improve Outcome: Progressing   Problem: Skin Integrity: Goal: Risk for impaired skin integrity will decrease Outcome: Progressing   

## 2022-12-26 NOTE — Progress Notes (Signed)
Rounding Note    Patient Name: [name] Date of Encounter: 01-03-2023  [facility] Cardiologist:  Subjective   Pt resting   Does not respond with questions      Inpatient Medications    Scheduled Meds:  amiodarone  200 mg Oral BID   [START ON 12/24/2022] amiodarone  200 mg Oral Daily   apixaban  5 mg Oral BID   Chlorhexidine Gluconate Cloth  6 each Topical Daily   hydrocerin   Topical Daily   metoprolol tartrate  25 mg Oral BID   trimethoprim-polymyxin b  2 drop Both Eyes Q4H   Continuous Infusions:  ampicillin-sulbactam (UNASYN) IV 3 g (January 03, 2023 0423)   PRN Meds: metoprolol tartrate, prochlorperazine, sodium chloride flush   Vital Signs    Vitals:   03-Jan-2023 0015 2023/01/03 0400 01/03/23 0500 2023-01-03 0738  BP:  116/72    Pulse:  (!) 104    Resp: 20 (!) 22    Temp:  97.6 F (36.4 C)    TempSrc:  Axillary  Oral  SpO2: 97% 99%    Weight:   82.7 kg   Height:        Intake/Output Summary (Last 24 hours) at 01/03/2023 0759 Last data filed at 03-Jan-2023 1914 Gross per 24 hour  Intake 1615 ml  Output 750 ml  Net 865 ml      03-Jan-2023    5:00 AM 12/13/2022    5:38 AM 12/11/2022    6:40 PM  Last 3 Weights  Weight (lbs) 182 lb 5.1 oz 167 lb 15.9 oz 158 lb 15.2 oz  Weight (kg) 82.7 kg 76.2 kg 72.1 kg      Telemetry    Atrial fibrillation  Rates 80s to 100s   - Personally Reviewed  ECG     No new - Personally Reviewed  Physical Exam   GEN: Frail 87 yo in acute distress  Comfortable  Neck: JVP is normal Cardiac: Irreg irreg   No S3  No murmurs  Respiratory: Relatively clear anteriorly   GI: Soft, nontender, non-distended  MS: Tr LE edema; No deformity.  Labs    High Sensitivity Troponin:   Recent Labs  Lab 12/01/2022 1928 12/01/2022 2215  TROPONINIHS 103* 85*     Chemistry Recent Labs  Lab 12/13/22 0752 12/14/22 0228 2023/01/03 0410  NA 146* 144 143  K 3.6 4.1 4.3  CL 114* 109 108  CO2 21* 26 32  GLUCOSE 147* 124* 150*   BUN 39* 34* 36*  CREATININE 1.52* 1.45* 1.61*  CALCIUM 7.9* 7.9* 8.2*  MG 2.2 2.0 2.0  PROT 5.0* 5.5* 5.7*  ALBUMIN 2.2* 2.4* 2.5*  AST 149* 160* 97*  ALT 172* 199* 173*  ALKPHOS 95 93 78  BILITOT 1.5* 1.1 0.7  GFRNONAA 45* 48* 42*  ANIONGAP 11 9 3*    Lipids No results for input(s): "CHOL", "TRIG", "HDL", "LABVLDL", "LDLCALC", "CHOLHDL" in the last 168 hours.  Hematology Recent Labs  Lab 12/13/22 0752 12/14/22 0228 2023-01-03 0410  WBC 8.4 8.1 9.2  RBC 4.24 4.45 4.38  HGB 13.2 13.7 13.6  HCT 41.8 45.4 45.5  MCV 98.6 102.0* 103.9*  MCH 31.1 30.8 31.1  MCHC 31.6 30.2 29.9*  RDW 15.9* 16.0* 15.9*  PLT 76* 89* 89*   Thyroid No results for input(s): "TSH", "FREET4" in the last 168 hours.  BNP Recent Labs  Lab 12/19/2022 1928 12/12/22 0611  BNP 1,420.9* 911.9*    DDimer No results for input(s): "DDIMER"  in the last 168 hours.   Radiology   CT Angio Chest PE W and/or Wo Contrast   Result Date: 12/25/22 CLINICAL DATA:  Pulmonary embolus suspected with high probability. Fall. Sepsis. EXAM: CT ANGIOGRAPHY CHEST CT ABDOMEN AND PELVIS WITH CONTRAST TECHNIQUE: Multidetector CT imaging of the chest was performed using the standard protocol during bolus administration of intravenous contrast. Multiplanar CT image reconstructions and MIPs were obtained to evaluate the vascular anatomy. Multidetector CT imaging of the abdomen and pelvis was performed using the standard protocol during bolus administration of intravenous contrast. RADIATION DOSE REDUCTION: This exam was performed according to the departmental dose-optimization program which includes automated exposure control, adjustment of the mA and/or kV according to patient size and/or use of iterative reconstruction technique. CONTRAST:  75mL OMNIPAQUE IOHEXOL 350 MG/ML SOLN COMPARISON:  CT chest 07/25/2009 FINDINGS: CTA CHEST FINDINGS Cardiovascular: Good visualization of the central and segmental pulmonary arteries. Filling defects  are demonstrated in bilateral lower lobe subsegmental pulmonary arteries suggesting peripheral pulmonary embolus. No large central pulmonary emboli. Clot burden is very low. No contrast material is demonstrated in the left ventricle limiting evaluation of the RV to LV ratio. The cardiac chambers appear grossly symmetrical. Due to low clot burden, right heart strain is unlikely. There is diffuse cardiac enlargement with reflux of contrast material into the hepatic veins suggesting right heart failure. Normal caliber thoracic aorta. Calcification of the aorta and coronary arteries. Mediastinum/Nodes: No enlarged mediastinal, hilar, or axillary lymph nodes. Thyroid gland, trachea, and esophagus demonstrate no significant findings. Venous gas is present likely resulting from intravenous injections. Cardiac pacemaker. Lungs/Pleura: Diffuse emphysematous changes in the lungs. Bronchial wall thickening consistent with chronic bronchitis. Patchy airspace infiltrates in the right upper lung likely representing pneumonia. Aspiration could also have this appearance. No pleural effusions. No pneumothorax. Musculoskeletal: Degenerative changes in the spine and shoulders. Review of the MIP images confirms the above findings. CT ABDOMEN and PELVIS FINDINGS Hepatobiliary: No focal liver abnormality is seen. Status post cholecystectomy. No biliary dilatation. Pancreas: Unremarkable. No pancreatic ductal dilatation or surrounding inflammatory changes. Spleen: Normal in size without focal abnormality. Adrenals/Urinary Tract: No adrenal gland nodules. Nephrograms are diffusely heterogeneous and striated bilaterally, likely indicating pyelonephritis. No hydronephrosis or hydroureter. Bladder is decompressed with a Foley catheter in place. Stomach/Bowel: Stomach is within normal limits. Appendix is not identified. No evidence of bowel wall thickening, distention, or inflammatory changes. Vascular/Lymphatic: Diffuse aortic atherosclerosis  with diffuse calcification. No aneurysm or dissection. Reproductive: Prostate gland is mildly enlarged. Other: No free air or free fluid in the abdomen. Abdominal wall musculature appears intact. Musculoskeletal: Degenerative changes in the spine. Review of the MIP images confirms the above findings. IMPRESSION: 1. Positive examination for peripheral lower lobe pulmonary emboli. No significant central pulmonary embolus. Low clot burden indicates right heart strain would be unlikely. 2. Airspace disease in the right upper lung is likely pneumonia. 3. Diffuse emphysematous changes in the lungs. Chronic bronchitic changes. 4. Aortic atherosclerosis. 5. Heterogeneous striated nephrograms bilaterally likely indicate pyelonephritis. 6. Prostate gland is enlarged. Critical Value/emergent results were called by telephone at the time of interpretation on 12-25-22 at 9:43 pm to provider Vonita Moss , who verbally acknowledged these results. Electronically Signed   By: Burman Nieves M.D.   On: 12/25/22 21:52     Cardiac Studies    Echo  12/11/22 1. Left ventricular ejection fraction, by estimation, is 50 to 55%. The  left ventricle has low normal function. Left ventricular endocardial  border not optimally  defined to evaluate regional wall motion. Left  ventricular diastolic function could not be  evaluated.   2. Right ventricular systolic function is moderately reduced. The right  ventricular size is normal.   3. Right atrial size was severely dilated.   4. The mitral valve is normal in structure. Trivial mitral valve  regurgitation. No evidence of mitral stenosis.   5. The aortic valve is normal in structure. Aortic valve regurgitation is  not visualized. No aortic stenosis is present.   6. The inferior vena cava is dilated in size with <50% respiratory  variability, suggesting right atrial pressure of 15 mmHg.   Conclusion(s)/Recommendation(s): Technically very limited study with poor  sound  wave transmission. LV never seen well but EF appears preserved on  apica images. RV is moderately hypokientic. There is a pcing wire evident  in the coronary sinus.    Patient Profile      [name] is a 87 y.o. male with a hx of non-ischemic cardiomyopathy,  chronic systolic and diastolic heart failure, s/p biventricular ICD implant 2014, HTN, HLD, permanent A fib on Eliquis, COPD, remote tobacco use, who is being seen 12/11/2022 for the evaluation of A fib RVR at the request of Dr Caleb Popp.   Assessment & Plan    1  Atrial fibrillation  Permanent  Plan is for continued rate control  Currently on amiodarone 200 bid (LFTs improving) and metoprolol 25 bid  Continue Eliquis     2 Hx  NICM   Pt s/p BiV ICD 2014   LVEF 50 to 55% on echo here   RV function is mod reduced  May reflect effect of pulmonary dz Overall volume status appears OK     3   CAD   Nonobstructive by cath in the past  Trivial troponin elevation prob reflect demand ischemia with illness  4   ID Pt admitted with sepsis  Pt has been  bacteremia   ID seeing   Request TEE to evaluate  Ordered  Prep for tomorrow  5    thrombocytopena   Plt 155 on 7/17 Yesterday 76 K  Today  89K  Off of heparin (though early for this)  On Eliquis       7  Pulm  Peripheral pulm emboli  Low clot burden on CT   Ephysema on CT   8  GI  Pt with hepatitis   Transaminases improving    7  Renal  Cr 1.61 today      For questions or updates, please contact [facility] Please consult [email or web address] for contact info under        Signed, Dietrich Pates, MD  28-Dec-2022, 7:59 AM

## 2022-12-26 DEATH — deceased
